# Patient Record
Sex: Male | Born: 1950 | Race: Black or African American | Hispanic: No | Marital: Married | Smoking: Never smoker
Health system: Southern US, Community
[De-identification: ages and names within clinical notes are randomized; demographics above are authoritative.]

## PROBLEM LIST (undated history)

## (undated) DIAGNOSIS — K5790 Diverticulosis of intestine, part unspecified, without perforation or abscess without bleeding: Secondary | ICD-10-CM

## (undated) DIAGNOSIS — E785 Hyperlipidemia, unspecified: Secondary | ICD-10-CM

## (undated) DIAGNOSIS — M1990 Unspecified osteoarthritis, unspecified site: Secondary | ICD-10-CM

## (undated) DIAGNOSIS — K219 Gastro-esophageal reflux disease without esophagitis: Secondary | ICD-10-CM

## (undated) DIAGNOSIS — E119 Type 2 diabetes mellitus without complications: Secondary | ICD-10-CM

## (undated) DIAGNOSIS — I7774 Dissection of vertebral artery: Principal | ICD-10-CM

## (undated) DIAGNOSIS — E559 Vitamin D deficiency, unspecified: Secondary | ICD-10-CM

## (undated) DIAGNOSIS — G629 Polyneuropathy, unspecified: Secondary | ICD-10-CM

## (undated) DIAGNOSIS — K635 Polyp of colon: Secondary | ICD-10-CM

## (undated) DIAGNOSIS — G4730 Sleep apnea, unspecified: Secondary | ICD-10-CM

## (undated) DIAGNOSIS — I10 Essential (primary) hypertension: Secondary | ICD-10-CM

## (undated) DIAGNOSIS — G8929 Other chronic pain: Secondary | ICD-10-CM

## (undated) HISTORY — DX: Diverticulosis of intestine, part unspecified, without perforation or abscess without bleeding: K57.90

## (undated) HISTORY — DX: Vitamin D deficiency, unspecified: E55.9

## (undated) HISTORY — DX: Sleep apnea, unspecified: G47.30

## (undated) HISTORY — DX: Gastro-esophageal reflux disease without esophagitis: K21.9

## (undated) HISTORY — DX: Type 2 diabetes mellitus without complications: E11.9

## (undated) HISTORY — PX: POLYPECTOMY: SHX149

## (undated) HISTORY — DX: Essential (primary) hypertension: I10

## (undated) HISTORY — DX: Unspecified osteoarthritis, unspecified site: M19.90

## (undated) HISTORY — PX: COLONOSCOPY: SHX174

## (undated) HISTORY — PX: MENISCUS REPAIR: SHX5179

## (undated) HISTORY — DX: Polyneuropathy, unspecified: G62.9

## (undated) HISTORY — DX: Dissection of vertebral artery: I77.74

## (undated) HISTORY — DX: Hyperlipidemia, unspecified: E78.5

## (undated) HISTORY — DX: Polyp of colon: K63.5

## (undated) HISTORY — DX: Other chronic pain: G89.29

---

## 2001-04-26 ENCOUNTER — Ambulatory Visit (HOSPITAL_BASED_OUTPATIENT_CLINIC_OR_DEPARTMENT_OTHER): Admission: RE | Admit: 2001-04-26 | Discharge: 2001-04-26 | Payer: Self-pay | Admitting: Orthopedic Surgery

## 2001-12-12 ENCOUNTER — Emergency Department (HOSPITAL_COMMUNITY): Admission: EM | Admit: 2001-12-12 | Discharge: 2001-12-12 | Payer: Self-pay

## 2013-03-18 ENCOUNTER — Ambulatory Visit (INDEPENDENT_AMBULATORY_CARE_PROVIDER_SITE_OTHER): Payer: PRIVATE HEALTH INSURANCE | Admitting: Family Medicine

## 2013-03-18 VITALS — BP 128/79 | HR 86 | Temp 97.9°F | Resp 18 | Ht 70.0 in | Wt 249.9 lb

## 2013-03-18 DIAGNOSIS — R42 Dizziness and giddiness: Secondary | ICD-10-CM

## 2013-03-18 DIAGNOSIS — E119 Type 2 diabetes mellitus without complications: Secondary | ICD-10-CM

## 2013-03-18 DIAGNOSIS — R350 Frequency of micturition: Secondary | ICD-10-CM

## 2013-03-18 DIAGNOSIS — N390 Urinary tract infection, site not specified: Secondary | ICD-10-CM

## 2013-03-18 LAB — COMPREHENSIVE METABOLIC PANEL
ALT: 97 U/L — ABNORMAL HIGH (ref 0–53)
Alkaline Phosphatase: 64 U/L (ref 39–117)
Creat: 0.87 mg/dL (ref 0.50–1.35)
Sodium: 137 mEq/L (ref 135–145)
Total Bilirubin: 0.4 mg/dL (ref 0.3–1.2)
Total Protein: 7.2 g/dL (ref 6.0–8.3)

## 2013-03-18 LAB — POCT URINALYSIS DIPSTICK
Bilirubin, UA: NEGATIVE
Glucose, UA: 500
Ketones, UA: 15
Leukocytes, UA: NEGATIVE
Nitrite, UA: POSITIVE
Protein, UA: 300
Spec Grav, UA: >=1.03
Urobilinogen, UA: 0.2
pH, UA: 5.5

## 2013-03-18 LAB — POCT UA - MICROSCOPIC ONLY
Casts, Ur, LPF, POC: NEGATIVE
Crystals, Ur, HPF, POC: NEGATIVE
Mucus, UA: POSITIVE
Yeast, UA: NEGATIVE

## 2013-03-18 LAB — POCT CBC
Granulocyte percent: 81.7 % — AB (ref 37–80)
HCT, POC: 44.4 % (ref 43.5–53.7)
Hemoglobin: 14.1 g/dL (ref 14.1–18.1)
Lymph, poc: 1.3 (ref 0.6–3.4)
MCH, POC: 31.3 pg — AB (ref 27–31.2)
MCHC: 31.8 g/dL (ref 31.8–35.4)
MCV: 98.5 fL — AB (ref 80–97)
MID (cbc): 0.3 (ref 0–0.9)
MPV: 8 fL (ref 0–99.8)
POC Granulocyte: 6.9 (ref 2–6.9)
POC LYMPH PERCENT: 15.1 %L (ref 10–50)
POC MID %: 3.2 %M (ref 0–12)
Platelet Count, POC: 321 10*3/uL (ref 142–424)
RBC: 4.51 M/uL — AB (ref 4.69–6.13)
RDW, POC: 14.3 %
WBC: 8.4 10*3/uL (ref 4.6–10.2)

## 2013-03-18 LAB — COMPREHENSIVE METABOLIC PANEL WITH GFR
AST: 56 U/L — ABNORMAL HIGH (ref 0–37)
Albumin: 4.6 g/dL (ref 3.5–5.2)
BUN: 11 mg/dL (ref 6–23)
CO2: 27 meq/L (ref 19–32)
Calcium: 10.1 mg/dL (ref 8.4–10.5)
Chloride: 99 meq/L (ref 96–112)
Glucose, Bld: 210 mg/dL — ABNORMAL HIGH (ref 70–99)
Potassium: 4.8 meq/L (ref 3.5–5.3)

## 2013-03-18 LAB — GLUCOSE, POCT (MANUAL RESULT ENTRY): POC Glucose: 224 mg/dl — AB (ref 70–99)

## 2013-03-18 LAB — POCT GLYCOSYLATED HEMOGLOBIN (HGB A1C): Hemoglobin A1C: 9

## 2013-03-18 MED ORDER — CIPROFLOXACIN HCL 250 MG PO TABS: 250.0000 mg | ORAL_TABLET | Freq: Two times a day (BID) | ORAL | Status: DC

## 2013-03-18 NOTE — Patient Instructions (Addendum)
Urinary Tract Infection Urinary tract infections (UTIs) can develop anywhere along your urinary tract. Your urinary tract is your body's drainage system for removing wastes and extra water. Your urinary tract includes two kidneys, two ureters, a bladder, and a urethra. Your kidneys are a pair of bean-shaped organs. Each kidney is about the size of your fist. They are located below your ribs, one on each side of your spine. CAUSES Infections are caused by microbes, which are microscopic organisms, including fungi, viruses, and bacteria. These organisms are so small that they can only be seen through a microscope. Bacteria are the microbes that most commonly cause UTIs. SYMPTOMS  Symptoms of UTIs may vary by age and gender of the patient and by the location of the infection. Symptoms in young women typically include a frequent and intense urge to urinate and a painful, burning feeling in the bladder or urethra during urination. Older women and men are more likely to be tired, shaky, and weak and have muscle aches and abdominal pain. A fever may mean the infection is in your kidneys. Other symptoms of a kidney infection include pain in your back or sides below the ribs, nausea, and vomiting. DIAGNOSIS To diagnose a UTI, your caregiver will ask you about your symptoms. Your caregiver also will ask to provide a urine sample. The urine sample will be tested for bacteria and white blood cells. White blood cells are made by your body to help fight infection. TREATMENT  Typically, UTIs can be treated with medication. Because most UTIs are caused by a bacterial infection, they usually can be treated with the use of antibiotics. The choice of antibiotic and length of treatment depend on your symptoms and the type of bacteria causing your infection. HOME CARE INSTRUCTIONS  If you were prescribed antibiotics, take them exactly as your caregiver instructs you. Finish the medication even if you feel better after you  have only taken some of the medication.  Drink enough water and fluids to keep your urine clear or pale yellow.  Avoid caffeine, tea, and carbonated beverages. They tend to irritate your bladder.  Empty your bladder often. Avoid holding urine for long periods of time.  Empty your bladder before and after sexual intercourse.  After a bowel movement, women should cleanse from front to back. Use each tissue only once. SEEK MEDICAL CARE IF:   You have back pain.  You develop a fever.  Your symptoms do not begin to resolve within 3 days. SEEK IMMEDIATE MEDICAL CARE IF:   You have severe back pain or lower abdominal pain.  You develop chills.  You have nausea or vomiting.  You have continued burning or discomfort with urination. MAKE SURE YOU:   Understand these instructions.  Will watch your condition.  Will get help right away if you are not doing well or get worse. Document Released: 06/18/2005 Document Revised: 03/09/2012 Document Reviewed: 10/17/2011 [facility] Patient Information 2014 [place], [place]. Diabetes and Exercise Regular exercise is important and can help:   Control blood glucose (sugar).  Decrease blood pressure.    Control blood lipids (cholesterol, triglycerides).  Improve overall health. BENEFITS FROM EXERCISE  Improved fitness.  Improved flexibility.  Improved endurance.  Increased bone density.  Weight control.  Increased muscle strength.  Decreased body fat.  Improvement of the body's use of insulin, a hormone.  Increased insulin sensitivity.  Reduction of insulin needs.  Reduced stress and tension.  Helps you feel better. People with diabetes who add exercise to their  lifestyle gain additional benefits, including:  Weight loss.  Reduced appetite.  Improvement of the body's use of blood glucose.  Decreased risk factors for heart disease:  Lowering of cholesterol and triglycerides.  Raising the level of good  cholesterol (high-density lipoproteins, HDL).  Lowering blood sugar.  Decreased blood pressure. TYPE 1 DIABETES AND EXERCISE  Exercise will usually lower your blood glucose.  If blood glucose is greater than 240 mg/dl, check urine ketones. If ketones are present, do not exercise.  Location of the insulin injection sites may need to be adjusted with exercise. Avoid injecting insulin into areas of the body that will be exercised. For example, avoid injecting insulin into:  The arms when playing tennis.  The legs when jogging. For more information, discuss this with your caregiver.  Keep a record of:  Food intake.  Type and amount of exercise.  Expected peak times of insulin action.  Blood glucose levels. Do this before, during, and after exercise. Review your records with your caregiver. This will help you to develop guidelines for adjusting food intake and insulin amounts.  TYPE 2 DIABETES AND EXERCISE  Regular physical activity can help control blood glucose.  Exercise is important because it may:  Increase the body's sensitivity to insulin.  Improve blood glucose control.  Exercise reduces the risk of heart disease. It decreases serum cholesterol and triglycerides. It also lowers blood pressure.  Those who take insulin or oral hypoglycemic agents should watch for signs of hypoglycemia. These signs include dizziness, shaking, sweating, chills, and confusion.  Body water is lost during exercise. It must be replaced. This will help to avoid loss of body fluids (dehydration) or heat stroke. Be sure to talk to your caregiver before starting an exercise program to make sure it is safe for you. Remember, any activity is better than none.  Document Released: 11/29/2003 Document Revised: 12/01/2011 Document Reviewed: 03/15/2009 [facility] Patient Information 2014 [place], [place].

## 2013-03-18 NOTE — Progress Notes (Signed)
[facility]:  Office Visit  Chief Complaint:  Chief Complaint  Patient presents with  . Dizziness    x 10 days   . Nausea  . Ear Fullness    left    HPI: Kirk Oneal is a 62 y.o. male who complains of  This morning had dizziness, felt like the world was spinning when he was getting up and out of his car from a sitting to standing position. HE is diabetic so he checked his sugars, his blood glucose was 186. BP 143/80 when he tiook it at home. Then he went home and laid down and then got better. The last time he got this was 5 years ago. He worked all night at the [employer]. The last time he got better in 1 day. Has had ear fullness last 10 days, it goes and comes for last 10 days. Dizziness with ROM of head. He has been dry heaving. No fevers or chills. + nauseated. He came home this morning and ate some ceraeal and was not able to keep it down.He has been having increase frequency but no dysuria or pelvic pain. He ahs not been compliant with his metformin or diabetes diet/exercise.  He has been popping alkaseltzers lately for heart burn due to excessive fried foods. Deneis CP/SOB, diaphoresis, HAs, vision changes or weakness. HE has gotten progressively better adn the dizziness is only now with quick ROM of head.   Past Medical History  Diagnosis Date  . Diabetes mellitus without complication    History reviewed. No pertinent past surgical history. History   Social History  . Marital Status: Married    Spouse Name: N/A    Number of Children: N/A  . Years of Education: N/A   Social History Main Topics  . Smoking status: Never Smoker   . Smokeless tobacco: None  . Alcohol Use: No  . Drug Use: No  . Sexually Active: None   Other Topics Concern  . None   Social History Narrative  . None   No family history on file. No Known Allergies Prior to Admission medications   Medication Sig Start Date End Date Taking? Authorizing Provider  metFORMIN  (GLUCOPHAGE) 1000 MG tablet Take 1,000 mg by mouth 2 (two) times daily with a meal.   Yes Historical Provider, MD     ROS: The patient denies fevers, chills, night sweats, unintentional weight loss, chest pain, palpitations, wheezing, dyspnea on exertion, nausea, vomiting, abdominal pain, dysuria, hematuria, melena, numbness, weakness, or tingling.   All other systems have been reviewed and were otherwise negative with the exception of those mentioned in the HPI and as above.    PHYSICAL EXAM: Filed Vitals:   03/18/13 1534  BP: 128/79  Pulse: 86  Temp: 97.9 F (36.6 C)  Resp: 18   Filed Vitals:   03/18/13 1534  Height: 5\' 10"  (1.778 m)  Weight: 249 lb 14.4 oz (113.354 kg)   Body mass index is 35.86 kg/(m^2).  General: Alert, no acute distress HEENT:  Normocephalic, atraumatic, oropharynx patent. EOMI, PERRLA, fundoscopic exam is nl Cardiovascular:  Regular rate and rhythm, no rubs murmurs or gallops.  No Carotid bruits, radial pulse intact. No pedal edema.  Respiratory: Clear to auscultation bilaterally.  No wheezes, rales, or rhonchi.  No cyanosis, no use of accessory musculature GI: No organomegaly, abdomen is soft and non-tender, positive bowel sounds.  No masses. Skin: No rashes. Neurologic: Facial musculature symmetric. Gilberto Better + Psychiatric: Patient is  appropriate throughout our interaction. Lymphatic: No cervical lymphadenopathy Musculoskeletal: Gait intact.   LABS: Results for orders placed in visit on 03/18/13  POCT CBC      Result Value Range   WBC 8.4  4.6 - 10.2 K/uL   Lymph, poc 1.3  0.6 - 3.4   POC LYMPH PERCENT 15.1  10 - 50 %L   MID (cbc) 0.3  0 - 0.9   POC MID % 3.2  0 - 12 %M   POC Granulocyte 6.9  2 - 6.9   Granulocyte percent 81.7 (*) 37 - 80 %G   RBC 4.51 (*) 4.69 - 6.13 M/uL   Hemoglobin 14.1  14.1 - 18.1 g/dL   HCT, POC 40.9  81.1 - 53.7 %   MCV 98.5 (*) 80 - 97 fL   MCH, POC 31.3 (*) 27 - 31.2 pg   MCHC 31.8  31.8 - 35.4 g/dL   RDW,  POC 91.4     Platelet Count, POC 321  142 - 424 K/uL   MPV 8.0  0 - 99.8 fL  POCT GLYCOSYLATED HEMOGLOBIN (HGB A1C)      Result Value Range   Hemoglobin A1C 9.0    POCT URINALYSIS DIPSTICK      Result Value Range   Color, UA yellow     Clarity, UA clear     Glucose, UA 500     Bilirubin, UA neg     Ketones, UA 15     Spec Grav, UA >=1.030     Blood, UA mod     pH, UA 5.5     Protein, UA 300     Urobilinogen, UA 0.2     Nitrite, UA pos     Leukocytes, UA Negative    POCT UA - MICROSCOPIC ONLY      Result Value Range   WBC, Ur, HPF, POC 10-12     RBC, urine, microscopic 4-7     Bacteria, U Microscopic 4+     Mucus, UA pos     Epithelial cells, urine per micros 1-2     Crystals, Ur, HPF, POC neg     Casts, Ur, LPF, POC neg     Yeast, UA neg    GLUCOSE, POCT (MANUAL RESULT ENTRY)      Result Value Range   POC Glucose 224 (*) 70 - 99 mg/dl     EKG/XRAY:   Primary read interpreted by Dr. Conley Rolls at [facility].   ASSESSMENT/PLAN: Encounter Diagnoses  Name Primary?  . Dizziness and giddiness Yes  . Diabetes   . Increased frequency of urination   . UTI (urinary tract infection)    Dizziness due to UTI vs BPPV or orthostatics although orthostatic BPw as normal BP and pulse  laying  112/74, 82; 106/68, 90; 126/76, 94--Orthostatics normal Diabetes poorly controlled Urine cx pendind, CMP pending Push fluids Recheck DM in 2 weeks with sugar logs Take Metformin as rx F/u in 2 weeks Rx Cipro 250 mg BID x 3 days until cx returns Gross sideeffects, risk and benefits, and alternatives of medications d/w patient. Patient is aware that all medications have potential sideeffects and we are unable to predict every sideeffect or drug-drug interaction that may occur.       Hamilton Capri PHUONG, DO 03/18/2013 4:56 PM

## 2013-03-21 ENCOUNTER — Telehealth: Payer: Self-pay | Admitting: Family Medicine

## 2013-03-21 LAB — URINE CULTURE: Colony Count: >=100000

## 2013-03-21 NOTE — Telephone Encounter (Signed)
Spoke to patietn about labs

## 2013-03-24 ENCOUNTER — Telehealth: Payer: Self-pay

## 2013-03-24 ENCOUNTER — Other Ambulatory Visit: Payer: Self-pay | Admitting: Family Medicine

## 2013-03-24 NOTE — Telephone Encounter (Signed)
The UCx confirms a UTI and that the cipro she was given is the correct treatment.  3 days is typically adequate treatment.  Have her symptoms resolved?

## 2013-03-24 NOTE — Telephone Encounter (Signed)
Will you review culture and advise? He was given 3d cipro until culture

## 2013-03-24 NOTE — Telephone Encounter (Signed)
Pt states that he still having symptoms from the UTI that he was seen for recently, he would like a refill on cipro just incase the symptoms does not go away while he is on vacation. [facility] [address]  [place] [phone]

## 2013-03-25 NOTE — Telephone Encounter (Signed)
Pt aware of results. He is feeling better.

## 2013-03-30 ENCOUNTER — Ambulatory Visit (INDEPENDENT_AMBULATORY_CARE_PROVIDER_SITE_OTHER): Payer: PRIVATE HEALTH INSURANCE | Admitting: Family Medicine

## 2013-03-30 VITALS — BP 133/76 | HR 89 | Temp 97.8°F | Resp 18 | Ht 69.0 in | Wt 248.0 lb

## 2013-03-30 DIAGNOSIS — R42 Dizziness and giddiness: Secondary | ICD-10-CM

## 2013-03-30 DIAGNOSIS — H8110 Benign paroxysmal vertigo, unspecified ear: Secondary | ICD-10-CM

## 2013-03-30 LAB — POCT URINALYSIS DIPSTICK
Bilirubin, UA: NEGATIVE
Blood, UA: NEGATIVE
Glucose, UA: NEGATIVE
Leukocytes, UA: NEGATIVE
Nitrite, UA: NEGATIVE
Protein, UA: 30
Spec Grav, UA: >=1.03
Urobilinogen, UA: 0.2
pH, UA: 5.5

## 2013-03-30 LAB — POCT UA - MICROSCOPIC ONLY
Bacteria, U Microscopic: NEGATIVE
Casts, Ur, LPF, POC: NEGATIVE
Crystals, Ur, HPF, POC: NEGATIVE
Yeast, UA: NEGATIVE

## 2013-03-30 NOTE — Patient Instructions (Addendum)
Vertigo Vertigo means you feel like you are moving when you are not. Vertigo can make you feel like things around you are moving when they are not. This problem often goes away on its own.  HOME CARE   Follow your doctor's instructions.  Avoid driving.  Avoid using heavy machinery.  Avoid doing any activity that could be dangerous if you have a vertigo attack.  Tell your doctor if a medicine seems to cause your vertigo. GET HELP RIGHT AWAY IF:   Your medicines do not help or make you feel worse.  You have trouble talking or walking.  You feel weak or have trouble using your arms, hands, or legs.  You have bad headaches.  You keep feeling sick to your stomach (nauseous) or throwing up (vomiting).  Your vision changes.  A family member notices changes in your behavior.  Your problems get worse. MAKE SURE YOU:  Understand these instructions.  Will watch your condition.  Will get help right away if you are not doing well or get worse. Document Released: 06/17/2008 Document Revised: 12/01/2011 Document Reviewed: 03/27/2011 [facility] Patient Information 2014 [place], [place]. Dizziness Dizziness is a common problem. It is a feeling of unsteadiness or lightheadedness. You may feel like you are about to faint. Dizziness can lead to injury if you stumble or fall. A person of any age group can suffer from dizziness, but dizziness is more common in older adults. CAUSES  Dizziness can be caused by many different things, including:  Middle ear problems.  Standing for too long.  Infections.  An allergic reaction.  Aging.  An emotional response to something, such as the sight of blood.  Side effects of medicines.  Fatigue.  Problems with circulation or blood pressure.  Excess use of alcohol, medicines, or illegal drug use.  Breathing too fast (hyperventilation).  An arrhythmia or problems with your heart rhythm.  Low red blood cell count  (anemia).  Pregnancy.  Vomiting, diarrhea, fever, or other illnesses that cause dehydration.  Diseases or conditions such as Parkinson's disease, high blood pressure (hypertension), diabetes, and thyroid problems.  Exposure to extreme heat. DIAGNOSIS  To find the cause of your dizziness, your caregiver may do a physical exam, lab tests, radiologic imaging scans, or an electrocardiography test (ECG).  TREATMENT  Treatment of dizziness depends on the cause of your symptoms and can vary greatly. HOME CARE INSTRUCTIONS   Drink enough fluids to keep your urine clear or pale yellow. This is especially important in very hot weather. In the elderly, it is also important in cold weather.  If your dizziness is caused by medicines, take them exactly as directed. When taking blood pressure medicines, it is especially important to get up slowly.  Rise slowly from chairs and steady yourself until you feel okay.  In the morning, first sit up on the side of the bed. When this seems okay, stand slowly while holding onto something until you know your balance is fine.  If you need to stand in one place for a long time, be sure to move your legs often. Tighten and relax the muscles in your legs while standing.  If dizziness continues to be a problem, have someone stay with you for a day or two. Do this until you feel you are well enough to stay alone. Have the person call your caregiver if he or she notices changes in you that are concerning.  Do not drive or use heavy machinery if you feel dizzy.  Do not drink alcohol. SEEK IMMEDIATE MEDICAL CARE IF:   Your dizziness or lightheadedness gets worse.  You feel nauseous or vomit.  You develop problems with talking, walking, weakness, or using your arms, hands, or legs.  You are not thinking clearly or you have difficulty forming sentences. It may take a friend or family member to determine if your thinking is normal.  You develop chest pain,  abdominal pain, shortness of breath, or sweating.  Your vision changes.  You notice any bleeding.  You have side effects from medicine that seems to be getting worse rather than better. MAKE SURE YOU:   Understand these instructions.  Will watch your condition.  Will get help right away if you are not doing well or get worse. Document Released: 03/04/2001 Document Revised: 12/01/2011 Document Reviewed: 03/28/2011 [facility] Patient Information 2014 [place], [place].

## 2013-03-30 NOTE — Progress Notes (Signed)
[facility]:  Office Visit  Chief Complaint:  Chief Complaint  Patient presents with  . Follow-up    UTI  . Dizziness    started today    HPI: DENISE BRAMBLETT is a 62 y.o. male who complains of dizziness He has had this before, he has had UTI sxs, treated with Cipro Has had left ear pressure when he has dizzy spells This morning it happened after he got offf work, he thought his sugars were too high after he ate it was 190s so he took his Amryl 4 mg and started getting dizzy and nasueated.  He denies diaphoresis, CP, palpitations, vomting, abd pain I  Past Medical History  Diagnosis Date  . Diabetes mellitus without complication    History reviewed. No pertinent past surgical history. History   Social History  . Marital Status: Married    Spouse Name: N/A    Number of Children: N/A  . Years of Education: N/A   Social History Main Topics  . Smoking status: Never Smoker   . Smokeless tobacco: None  . Alcohol Use: No  . Drug Use: No  . Sexually Active: None   Other Topics Concern  . None   Social History Narrative  . None   History reviewed. No pertinent family history. No Known Allergies Prior to Admission medications   Medication Sig Start Date End Date Taking? Authorizing Provider  glipiZIDE (GLUCOTROL) 10 MG tablet Take 10 mg by mouth 2 (two) times daily before a meal.   Yes Historical Provider, MD  metFORMIN (GLUCOPHAGE) 1000 MG tablet Take 1,000 mg by mouth 2 (two) times daily with a meal.   Yes Historical Provider, MD  ciprofloxacin (CIPRO) 250 MG tablet Take 1 tablet (250 mg total) by mouth 2 (two) times daily. 03/18/13   Thao P Le, DO     ROS: The patient denies fevers, chills, night sweats, unintentional weight loss, chest pain, palpitations, wheezing, dyspnea on exertion, nausea, vomiting, abdominal pain, dysuria, hematuria, melena, numbness, weakness, or tingling.  All other systems have been reviewed and were otherwise negative  with the exception of those mentioned in the HPI and as above.    PHYSICAL EXAM: Filed Vitals:   03/30/13 1651  BP: 133/76  Pulse: 89  Temp:   Resp:    Filed Vitals:   03/30/13 1610  Height: 5\' 9"  (1.753 m)  Weight: 248 lb (112.492 kg)   Body mass index is 36.61 kg/(m^2).  General: Alert, no acute distress HEENT:  Normocephalic, atraumatic, oropharynx patent. Tm nl, EOMI, PERRLA Cardiovascular:  Regular rate and rhythm, no rubs murmurs or gallops.  No Carotid bruits, radial pulse intact. No pedal edema.  Respiratory: Clear to auscultation bilaterally.  No wheezes, rales, or rhonchi.  No cyanosis, no use of accessory musculature GI: No organomegaly, abdomen is soft and non-tender, positive bowel sounds.  No masses. Skin: No rashes. Neurologic: Facial musculature symmetric. Psychiatric: Patient is appropriate throughout our interaction. Lymphatic: No cervical lymphadenopathy Musculoskeletal: Gait intact.   LABS: Results for orders placed in visit on 03/30/13  POCT URINALYSIS DIPSTICK      Result Value Range   Color, UA yellow     Clarity, UA clear     Glucose, UA neg     Bilirubin, UA neg     Ketones, UA trace     Spec Grav, UA >=1.030     Blood, UA neg     pH, UA 5.5     Protein,  UA 30     Urobilinogen, UA 0.2     Nitrite, UA neg     Leukocytes, UA Negative    POCT UA - MICROSCOPIC ONLY      Result Value Range   WBC, Ur, HPF, POC 1-5     RBC, urine, microscopic 2-7     Bacteria, U Microscopic neg     Mucus, UA moderate     Epithelial cells, urine per micros 0-1     Crystals, Ur, HPF, POC neg     Casts, Ur, LPF, POC neg     Yeast, UA neg       EKG/XRAY:   Primary read interpreted by Dr. Conley Rolls at [facility].   ASSESSMENT/PLAN: Encounter Diagnoses  Name Primary?  . Dizziness and giddiness Yes  . Benign paroxysmal positional vertigo    Orthostatics normal Rx Meclizine Monitor BP and glucose He should really stop taking glimiperide 4 mg prn which may be a  contrinuting factor to his dizziness IF continue after dc Amryl prn completely and taking meclizine then he needs to followupwith US F/u prn , will call him by phone Work note  X 1 day given    LE, THAO PHUONG, DO 03/30/2013 5:49 PM

## 2013-04-12 ENCOUNTER — Telehealth: Payer: Self-pay

## 2013-04-12 NOTE — Telephone Encounter (Signed)
Dr.Le, Pt states that he is still experiencing vertigo symptoms (ex: slight dizziness, and vomiting). Pt also would like an out of work note for tonight if possible. Best# [phone]

## 2013-04-13 NOTE — Telephone Encounter (Signed)
Please advise 

## 2013-04-15 NOTE — Telephone Encounter (Signed)
I am ok with giving him the work note for the dat he needs it. I Would like to see him again, after he has been off of the amryl 4 mg prn and also takeing his meclizine and also takig his blood sugars. IF he can kee a log of his sugars when this is happpening and time of the events it wold be great. PLease follow-up in 1 week. Sooner if he has worening sxs or go to ER as needed,       Left message for him to call me back.

## 2013-04-15 NOTE — Telephone Encounter (Signed)
Patient called back, was advised, work note at front desk for pick up

## 2013-04-30 ENCOUNTER — Emergency Department (HOSPITAL_COMMUNITY): Payer: No Typology Code available for payment source

## 2013-04-30 ENCOUNTER — Encounter (HOSPITAL_COMMUNITY): Payer: Self-pay | Admitting: Emergency Medicine

## 2013-04-30 ENCOUNTER — Emergency Department (HOSPITAL_COMMUNITY)
Admission: EM | Admit: 2013-04-30 | Discharge: 2013-04-30 | Disposition: A | Discharge status: Home / Self Care | Payer: No Typology Code available for payment source | Attending: Emergency Medicine | Admitting: Emergency Medicine

## 2013-04-30 DIAGNOSIS — E119 Type 2 diabetes mellitus without complications: Secondary | ICD-10-CM | POA: Insufficient documentation

## 2013-04-30 DIAGNOSIS — Z79899 Other long term (current) drug therapy: Secondary | ICD-10-CM | POA: Insufficient documentation

## 2013-04-30 DIAGNOSIS — I7774 Dissection of vertebral artery: Secondary | ICD-10-CM | POA: Insufficient documentation

## 2013-04-30 LAB — POCT I-STAT, CHEM 8
Calcium, Ion: 1.18 mmol/L (ref 1.13–1.30)
Creatinine, Ser: 0.9 mg/dL (ref 0.50–1.35)
Hemoglobin: 13.6 g/dL (ref 13.0–17.0)
Sodium: 138 mEq/L (ref 135–145)
TCO2: 23 mmol/L (ref 0–100)

## 2013-04-30 MED ORDER — DIAZEPAM 5 MG PO TABS
5.0000 mg | ORAL_TABLET | Freq: Once | ORAL | Status: AC
  Administered 2013-04-30: 5 mg via ORAL
  Filled 2013-04-30: qty 1

## 2013-04-30 MED ORDER — CLOPIDOGREL BISULFATE 75 MG PO TABS: 75.0000 mg | ORAL_TABLET | Freq: Every day | ORAL | Status: DC

## 2013-04-30 MED ORDER — CLOPIDOGREL BISULFATE 75 MG PO TABS
75.0000 mg | ORAL_TABLET | Freq: Once | ORAL | Status: AC
  Administered 2013-04-30: 75 mg via ORAL
  Filled 2013-04-30: qty 1

## 2013-04-30 MED ORDER — ASPIRIN 81 MG PO CHEW: 81.0000 mg | CHEWABLE_TABLET | Freq: Every day | ORAL | Status: DC

## 2013-04-30 MED ORDER — IOHEXOL 350 MG/ML SOLN
100.0000 mL | Freq: Once | INTRAVENOUS | Status: AC | PRN
  Administered 2013-04-30: 100 mL via INTRAVENOUS

## 2013-04-30 MED ORDER — ONDANSETRON 8 MG PO TBDP
8.0000 mg | ORAL_TABLET | Freq: Once | ORAL | Status: AC
  Administered 2013-04-30: 8 mg via ORAL
  Filled 2013-04-30 (×2): qty 1

## 2013-04-30 MED ORDER — ASPIRIN 325 MG PO TABS
325.0000 mg | ORAL_TABLET | Freq: Once | ORAL | Status: AC
  Administered 2013-04-30: 325 mg via ORAL
  Filled 2013-04-30: qty 1

## 2013-04-30 NOTE — ED Notes (Signed)
Patient transported to MRI by Jorja Loa, RN.

## 2013-04-30 NOTE — ED Provider Notes (Signed)
CSN: [id number]     Arrival date & time 04/30/13  0701 History     First MD Initiated Contact with Patient 04/30/13 [phone]     Chief Complaint  Patient presents with  . Dizziness    The history is provided by the patient.   patient reports ongoing dizziness over the past 4-6 weeks.  He states today's episode is worse that has been for the past.  He states he is having some difficulty walking.  He has no headaches.  He does have some mild right neck pain which been present over the past 2 weeks.  His symptoms had started prior to his neck pain.  He has had some chiropractic work to see if his neck pain will improve with manipulation.  Has significant headaches.  History cancer.  He is a diabetic.  No fevers or chills.  No neck stiffness.  No chest pain or shortness of breath.  He states that rapid movements of his head seemed to make his symptoms worse.  He is on Anti-vertigo homeopathic products that seem to be helping his symptoms.  Today they did help his symptoms and thus he presents the emergency department.  He has had 2 office visits for the same symptoms of the past 4 weeks.  No prior history of stroke.  No recent trauma.  Patient does not smoke cigarettes.  His only medical problems diabetes.  Past Medical History  Diagnosis Date  . Diabetes mellitus without complication    History reviewed. No pertinent past surgical history. No family history on file. History  Substance Use Topics  . Smoking status: Never Smoker   . Smokeless tobacco: Not on file  . Alcohol Use: Yes     Comment: occasional    Review of Systems  All other systems reviewed and are negative.    Allergies  Review of patient's allergies indicates no known allergies.  Home Medications   Current Outpatient Rx  Name  Route  Sig  Dispense  Refill  . aspirin-sod bicarb-citric acid (ALKA-SELTZER) 325 MG TBEF tablet   Oral   Take 325 mg by mouth every 6 (six) hours as needed (for headache).         .  Homeopathic Products (VERTIGOHEEL PO)   Oral   Take 1 tablet by mouth daily. For swimmers head         . metFORMIN (GLUCOPHAGE) 1000 MG tablet   Oral   Take 1,000 mg by mouth 2 (two) times daily with a meal.          BP 120/69  Pulse 79  Temp(Src) 97.9 F (36.6 C) (Oral)  Resp 18  Wt 253 lb (114.76 kg)  BMI 37.34 kg/m2  SpO2 97% Physical Exam  Nursing note and vitals reviewed. Constitutional: He is oriented to person, place, and time. He appears well-developed and well-nourished.  HENT:  Head: Normocephalic and atraumatic.  Eyes: EOM are normal. Pupils are equal, round, and reactive to light.  Neck: Normal range of motion.  Cardiovascular: Normal rate, regular rhythm, normal heart sounds and intact distal pulses.   Pulmonary/Chest: Effort normal and breath sounds normal. No respiratory distress.  Abdominal: Soft. He exhibits no distension. There is no tenderness.  Genitourinary: Rectum normal.  Musculoskeletal: Normal range of motion.  Neurological: He is alert and oriented to person, place, and time.  5/5 strength in major muscle groups of  bilateral upper and lower extremities. Speech normal. No facial asymetry.  Normal finger to nose bilaterally.  Ataxic gait, unable to tandem gait  Skin: Skin is warm and dry.  Psychiatric: He has a normal mood and affect. Judgment normal.    ED Course   Procedures (including critical care time)  Labs Reviewed  POCT I-STAT, CHEM 8 - Abnormal; Notable for the following:    Glucose, Bld 199 (*)    All other components within normal limits   Ct Angio Head W/cm &/or Wo Cm  04/30/2013   *RADIOLOGY REPORT*  Clinical Data:  30-day history of dizziness, vertigo and right neck pain.  Abnormal MR.  CT ANGIOGRAPHY HEAD AND NECK  Technique:  Multidetector CT imaging of the head and neck was performed using the standard protocol during bolus administration of intravenous contrast.  Multiplanar CT image reconstructions including MIPs were  obtained to evaluate the vascular anatomy. Carotid stenosis measurements (when applicable) are obtained utilizing NASCET criteria, using the distal internal carotid diameter as the denominator.  Contrast: OMNIPAQUE IOHEXOL 350 MG/ML SOLN  Comparison:  Brain MR 04/30/2013.  CTA NECK  Findings:  Aortic arch incompletely assessed on the present examination.  Streak artifact from injection slightly limits evaluation of aortic arch and great vessels.  Right vertebral artery is markedly attenuated in caliber, smaller than the size of the transverse foramen.  With the clinical history, this is suspicious for dissection with atherosclerotic type changes a secondary consideration.  The right vertebral artery does not appear completely occluded.  Slightly limited evaluation of proximal aspect of the left vertebral artery caused by streak artifact and patient's shoulders. Beyond this region, left vertebral artery is patent without significant narrowing or irregularity.  Evaluation of the common carotid artery limited by streak artifact. Portions visualized unremarkable.  No evidence of hemodynamically significant stenosis involving either carotid bifurcation.  Dental artifact extends through the proximal internal carotid arteries more notable on the left slightly limiting evaluation at this level.  No primary worrisome neck mass.  No adenopathy.  Lung apices which are visualized are clear.  Cervical spondylotic changes incompletely assessed secondary patient's habitus.  No bony destructive lesion.   Review of the MIP images confirms the above findings.  IMPRESSION: Right vertebral artery is markedly attenuated in caliber, smaller than the size of the transverse foramen.  With the clinical history, this is suspicious for dissection with atherosclerotic type changes a secondary consideration.  The right vertebral artery does not appear completely occluded.  Please see above.  CTA HEAD  Findings:  No intracranial  hemorrhage.  No CT evidence of large acute infarct.  No intracranial enhancing lesion.  Cavernous segment internal carotid artery mild calcified plaque with mild narrowing.  Anterior circulation without medium or large size vessel significant stenosis or occlusion.  The left vertebral artery is dominant.  Diminutive irregular right vertebral artery suggestive of result of dissection with atherosclerotic type changes a secondary less likely consideration.  Mild narrowing and irregularity of the basilar artery.  No aneurysm or vascular malformation noted.  Mild venous contamination slightly limits evaluation for detection of small aneurysm.   Review of the MIP images confirms the above findings.  IMPRESSION: Diminutive irregular right vertebral artery suggestive of result of dissection with atherosclerotic type changes a secondary less likely consideration.  Mild narrowing and irregularity of the basilar artery.  Cavernous segment internal carotid artery mild calcified plaque with mild narrowing.   Original Report Authenticated By: Lacy Duverney, M.D.   Ct Angio Neck W/cm &/or Wo/cm  04/30/2013   *RADIOLOGY REPORT*  Clinical Data:  30-day history  of dizziness, vertigo and right neck pain.  Abnormal MR.  CT ANGIOGRAPHY HEAD AND NECK  Technique:  Multidetector CT imaging of the head and neck was performed using the standard protocol during bolus administration of intravenous contrast.  Multiplanar CT image reconstructions including MIPs were obtained to evaluate the vascular anatomy. Carotid stenosis measurements (when applicable) are obtained utilizing NASCET criteria, using the distal internal carotid diameter as the denominator.  Contrast: OMNIPAQUE IOHEXOL 350 MG/ML SOLN  Comparison:  Brain MR 04/30/2013.  CTA NECK  Findings:  Aortic arch incompletely assessed on the present examination.  Streak artifact from injection slightly limits evaluation of aortic arch and great vessels.  Right vertebral artery is  markedly attenuated in caliber, smaller than the size of the transverse foramen.  With the clinical history, this is suspicious for dissection with atherosclerotic type changes a secondary consideration.  The right vertebral artery does not appear completely occluded.  Slightly limited evaluation of proximal aspect of the left vertebral artery caused by streak artifact and patient's shoulders. Beyond this region, left vertebral artery is patent without significant narrowing or irregularity.  Evaluation of the common carotid artery limited by streak artifact. Portions visualized unremarkable.  No evidence of hemodynamically significant stenosis involving either carotid bifurcation.  Dental artifact extends through the proximal internal carotid arteries more notable on the left slightly limiting evaluation at this level.  No primary worrisome neck mass.  No adenopathy.  Lung apices which are visualized are clear.  Cervical spondylotic changes incompletely assessed secondary patient's habitus.  No bony destructive lesion.   Review of the MIP images confirms the above findings.  IMPRESSION: Right vertebral artery is markedly attenuated in caliber, smaller than the size of the transverse foramen.  With the clinical history, this is suspicious for dissection with atherosclerotic type changes a secondary consideration.  The right vertebral artery does not appear completely occluded.  Please see above.  CTA HEAD  Findings:  No intracranial hemorrhage.  No CT evidence of large acute infarct.  No intracranial enhancing lesion.  Cavernous segment internal carotid artery mild calcified plaque with mild narrowing.  Anterior circulation without medium or large size vessel significant stenosis or occlusion.  The left vertebral artery is dominant.  Diminutive irregular right vertebral artery suggestive of result of dissection with atherosclerotic type changes a secondary less likely consideration.  Mild narrowing and irregularity  of the basilar artery.  No aneurysm or vascular malformation noted.  Mild venous contamination slightly limits evaluation for detection of small aneurysm.   Review of the MIP images confirms the above findings.  IMPRESSION: Diminutive irregular right vertebral artery suggestive of result of dissection with atherosclerotic type changes a secondary less likely consideration.  Mild narrowing and irregularity of the basilar artery.  Cavernous segment internal carotid artery mild calcified plaque with mild narrowing.   Original Report Authenticated By: Lacy Duverney, M.D.   Mr Brain Wo Contrast  04/30/2013   *RADIOLOGY REPORT*  Clinical Data: Dizziness, vertigo, ataxia and gait instability over past 30 days and worsening.  Diabetes.  MRI HEAD WITHOUT CONTRAST  Technique:  Multiplanar, multiecho pulse sequences of the brain and surrounding structures were obtained according to standard protocol without intravenous contrast.  Comparison: None.  Findings: Abnormal appearance of the right vertebral artery which appears occluded.  Question if this is related to a dissection or atherosclerotic changes.  CT angiogram or MR angiogram can be obtained for further delineation.  No acute infarct.  No intracranial hemorrhage.  No hydrocephalus.  No intracranial mass lesion detected on this unenhanced exam.  Cervical medullary junction, pituitary region, pineal region and orbital structures unremarkable.  Minimal paranasal sinus mucosal thickening.  IMPRESSION:  Abnormal appearance of the right vertebral artery which appears occluded.  Question if this is related to a dissection or atherosclerotic changes.  CT angiogram or MR angiogram can be obtained for further delineation.  No acute infarct.  Critical Value/emergent results were called by telephone at the time of interpretation on 04/30/2013 at 9:20 a.m. to Dr. Patria Mane, who verbally acknowledged these results.   Original Report Authenticated By: Lacy Duverney, M.D.   I personally  reviewed the imaging tests through PACS system I reviewed available ER/hospitalization records through the EMR   1. Vertebral artery dissection     MDM  Likely peripheral vertigo.  Given the patient's ataxia MRI of his brain will be obtained to evaluate for central vertigo.  Oral Valium and Zofran now.  9:32 AM Abnormal right vertebral artery on MRI.  Patient will undergo CT angina neck to evaluate for vertebral dissection.  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  I discussed the case with neurology recommends close outpatient followup.  Neurology is recommending dual platelet treatment with aspirin Plavix.  Patient understands return to the ER for new or worsening symptoms.  Patient is stable for discharge home at this time.  Lyanne Co, MD 04/30/13 [phone]

## 2013-04-30 NOTE — ED Notes (Signed)
Saline lock placed in left hand, 20 gauge angiocath.  Flushed, patient tolerated procedure well.

## 2013-04-30 NOTE — ED Notes (Signed)
Patient resting quietly on stretcher.

## 2013-04-30 NOTE — ED Notes (Addendum)
Pt c/o dizziness worse with moving head x 30 days. Pt states he has seen PCP and chiropractor with no relief. Denies LOC, + nausea, denies v/d with this episode. A & O, NAD

## 2013-04-30 NOTE — ED Notes (Signed)
Pt c/o dizziness times 30 days.  Seen and prescribed :verticlear" and treated for a urinary tract infection.  Also c/o muscular pain in 2 days.  Patient instructed to use call bell and yellow bracelet attached to wrist.  In no acute distress.

## 2013-05-13 ENCOUNTER — Telehealth: Payer: Self-pay | Admitting: Neurology

## 2013-05-17 ENCOUNTER — Encounter: Payer: Self-pay | Admitting: Neurology

## 2013-05-17 ENCOUNTER — Ambulatory Visit (INDEPENDENT_AMBULATORY_CARE_PROVIDER_SITE_OTHER): Payer: PRIVATE HEALTH INSURANCE | Admitting: Neurology

## 2013-05-17 VITALS — BP 131/82 | HR 85 | Temp 97.2°F | Ht 68.5 in | Wt 251.0 lb

## 2013-05-17 DIAGNOSIS — I7774 Dissection of vertebral artery: Secondary | ICD-10-CM

## 2013-05-17 DIAGNOSIS — E119 Type 2 diabetes mellitus without complications: Secondary | ICD-10-CM

## 2013-05-17 DIAGNOSIS — R0683 Snoring: Secondary | ICD-10-CM

## 2013-05-17 DIAGNOSIS — R0609 Other forms of dyspnea: Secondary | ICD-10-CM

## 2013-05-17 DIAGNOSIS — E785 Hyperlipidemia, unspecified: Secondary | ICD-10-CM

## 2013-05-17 DIAGNOSIS — R42 Dizziness and giddiness: Secondary | ICD-10-CM

## 2013-05-17 HISTORY — DX: Dissection of vertebral artery: I77.74

## 2013-05-17 NOTE — Patient Instructions (Signed)
Please ask family and friends or your significant other or bed partner if you snore and if so, how loud it is, and if you have breathing related issues in your sleep, such as: snorting sounds, choking sounds, pauses in your breathing or shallow breathing events. These may be symptoms of obstructive sleep apnea (OSA).   Continue your medications as directed, which includes dual antiplatelet therapy: Aspirin and Plavix until further. As discussed, secondary prevention is key after a stroke or TIA or when there is hardening of the arteries. This means: taking care of blood sugar values or diabetes management, good blood pressure (hypertension) control and optimizing cholesterol management, exercising daily or regularly within your own mobility limitations of course and overall cardiovascular risk factor reduction, which includes screening for and treatment of obstructive sleep apnea (OSA).   Please make an appointment with your primary care physician for BP, Diabetes and cholesterol management.   I will request a second opinion from one of our vascular specialists, either Dr. Marjory Lies or Dr. Pearlean Brownie.   We may go ahead and do a sleep study down the road as you are at risk for sleep apnea, due to being overweight and having a larger neck size and having a tighter airway.

## 2013-05-17 NOTE — Progress Notes (Signed)
Subjective:    Patient ID: Kirk Oneal is a 62 y.o. male.  HPI Kirk Foley, MD, PhD [facility] [address], [address] [address] [place], [place] [address]  Dear Dr. Patria Mane,   I saw patient Kirk Oneal, upon your kind request in my neurologic clinic today for initial consultation of his dizziness. The patient is unaccompanied today. As you know, Kirk Oneal is a very pleasant 62 year old right-handed gentleman with an underlying medical history of diabetes, who presented to the emergency room on 04/30/2013 with a fourth 6 week history of dizziness. Before that he went to [facility] in July and was told he had a UTI. He went back and was given an Rx for meclizine, which did help in the beginning, but as it did not help on 04/29/13, which was a thursday, he went to the ER and wanted to get checked out completely as he had recurrent Sx without definitive answer. He had been taking a baby aspirin for about 8 months per PCP.  He has had prior episodes of dizziness about 3 times in the last 6 months and one time some 4 years ago, lasting for hours at a time. He reported no recurrent headaches, but does report a slight neck since July. He went to the chiropractor at the end of July and has had some chiropractic treatment including neck manipulation one time, however, his symptoms were present before and did not get worse after. 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 His vertiginous symptoms are made worse with head movements. He has no current vertigo, but does not feel completely right either he states. He had a CT angiogram head and neck on 04/30/2013 which showed right vertebral artery to be markedly attenuated in caliber, smaller than the size of the transverse foramen. There was suspicion for dissection. CTA head showed no significant abnormalities. MRI brain without contrast  showed abnormal appearance of the right vertebral artery which appears occluded and no acute infarct was seen. Neurology was consulted while the patient was in the emergency room and recommended close followup outpatient and addition of Plavix. Of note, he snores sometimes, mostly mild, and denies apneic pauses or choking sensations. However, he does not feel rested. 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   His Past Medical History Is Significant For: Past Medical History  Diagnosis Date  . Diabetes mellitus without complication    His Past Surgical History Is Significant For: History reviewed. No pertinent past surgical history.  His Family History Is Significant For: History reviewed. No pertinent family history.  His Social History Is Significant For: History   Social History  . Marital Status: Married    Spouse Name: N/A    Number of Children: N/A  . Years of Education: N/A   Social History Main Topics  . Smoking status: Never Smoker   . Smokeless tobacco: None  . Alcohol Use: Yes     Comment: occasional  . Drug Use: No  . Sexual Activity: None   Other Topics Concern  . None   Social History Narrative  . None    His Allergies Are:  No Known Allergies:   His Current Medications Are:  Outpatient Encounter Prescriptions as of 05/17/2013  Medication Sig Dispense Refill  . aspirin 81 MG chewable tablet Chew 1 tablet (81 mg total) by mouth  daily.  30 tablet  0  . aspirin-sod bicarb-citric acid (ALKA-SELTZER) 325 MG TBEF tablet Take 325 mg by mouth every 6 (six) hours as needed (for headache).      . clopidogrel (PLAVIX) 75 MG tablet Take 1 tablet (75 mg total) by mouth daily.  30 tablet  0  . glimepiride (AMARYL) 4 MG tablet Take 1 tablet by mouth daily.      . Homeopathic Products (VERTIGOHEEL PO) Take 1 tablet by mouth daily. For swimmers head      . metFORMIN (GLUCOPHAGE) 1000 MG tablet Take 1,000 mg by mouth 2 (two) times  daily with a meal.      . ZETIA 10 MG tablet Take 1 tablet by mouth daily.       No facility-administered encounter medications on file as of 05/17/2013.   Review of Systems  HENT: Positive for tinnitus.   Neurological: Positive for dizziness.  Psychiatric/Behavioral:       Sleepiness    Objective:  Neurologic Exam  Physical Exam Physical Examination:   Filed Vitals:   05/17/13 0937  BP: 131/82  Pulse: 85  Temp: 97.2 F (36.2 C)    General Examination: The patient is a very pleasant 62 y.o. male in no acute distress. He appears well-developed and well-nourished and well groomed. He is overweight.  HEENT: Normocephalic, atraumatic, pupils are equal, round and reactive to light and accommodation. Funduscopic exam is normal with sharp disc margins noted. Extraocular tracking is good without limitation to gaze excursion or nystagmus noted. Normal smooth pursuit is noted. Hearing is grossly intact. Tympanic membranes are clear bilaterally. Face is symmetric with normal facial animation and normal facial sensation. Speech is clear with no dysarthria noted. There is no hypophonia. There is no lip, neck/head, jaw or voice tremor. Neck is supple with full range of passive and active motion. There are no carotid bruits on auscultation. Oropharynx exam reveals: mild mouth dryness, adequate dental hygiene and marked airway crowding, due to redundant soft palate and large her uvula, tonsillar size are 1+, large tongue. Mallampati is class III. Tongue protrudes centrally and palate elevates symmetrically. Neck size is 18.5 inches.   Chest: Clear to auscultation without wheezing, rhonchi or crackles noted.  Heart: S1+S2+0, regular and normal without murmurs, rubs or gallops noted.   Abdomen: Soft, non-tender and non-distended with normal bowel sounds appreciated on auscultation.  Extremities: There is no pitting edema in the distal lower extremities bilaterally. Pedal pulses are intact.  Skin:  Warm and dry without trophic changes noted. There are no varicose veins.  Musculoskeletal: exam reveals no obvious joint deformities, tenderness or joint swelling or erythema.   Neurologically:  Mental status: The patient is awake, alert and oriented in all 4 spheres. His memory, attention, language and knowledge are appropriate. There is no aphasia, agnosia, apraxia or anomia. Speech is clear with normal prosody and enunciation. Thought process is linear. Mood is congruent and affect is normal.  Cranial nerves are as described above under HEENT exam. In addition, shoulder shrug is normal with equal shoulder height noted. Motor exam: Normal bulk, strength and tone is noted. There is no drift, tremor or rebound. Romberg is negative, he has no vertigo. Reflexes are 2+ throughout. Toes are downgoing bilaterally. Fine motor skills are intact with normal finger taps, normal hand movements, normal rapid alternating patting, normal foot taps and normal foot agility.  Cerebellar testing shows no dysmetria or intention tremor on finger to nose testing. Heel to shin is unremarkable bilaterally.  There is no truncal or gait ataxia.  Sensory exam is intact to light touch, pinprick, vibration, temperature sense and proprioception in the upper and lower extremities.  Gait, station and balance are unremarkable. No veering to one side is noted. No leaning to one side is noted. Posture is age-appropriate and stance is narrow based. No problems turning are noted. He turns en bloc. Tandem walk is unremarkable. Intact toe stance is noted.               Assessment and Plan:   In summary, Kirk Oneal is a very pleasant 62 y.o.-year old male with a history of diabetes, and obesity and hyperlipidemia who has a history of recurrent vertigo and a recent diagnosis of a right vertebral dissection. He is doing fairly well at this time and most of our visit concentrated around this factor discussion and secondary cardiovascular  disease prevention. I would like for him to continue with dual antiplatelet therapy at this time which is aspirin plus Plavix which he has been tolerating well. We talked about secondary prevention regarding stroke risk factors which includes diabetes management, blood pressure management, cholesterol management. He was on cholesterol medication before and will most likely have to go back on it. I've asked him to make a followup appointment with his primary care physician to discuss BP management, diabetes management and cholesterol management. He recalls that his most recent hemoglobin A1c was around 9 which is clearly suboptimal. We also talked about weight reduction and screening pressure sleep apnea. While he does not endorse any apneic spells or cyst choking sensations I do believe he is at significant risk for obstructive sleep apnea given his obesity, his larger neck size and his tighter airway. I have asked him to talk to his wife about his snoring and whether she had witnessed any apneas. We may go ahead and proceed with a sleep study down the Road. Furthermore, I would like for him to see one of our vascular specialists in our group for second opinion to make sure this is the best management for him. I would like to see him back in 3 months from now, sooner if the need arises. He is encouraged to call us with any interim questions, concerns, problems or updates and refill requests for Plavix.   Thank you very much for allowing me to participate in the care of this nice patient. If I can be of any further assistance to you please do not hesitate to call me at [phone].  Sincerely,   Kirk Foley, MD, PhD

## 2013-05-25 ENCOUNTER — Other Ambulatory Visit: Payer: Self-pay | Admitting: Neurology

## 2013-06-03 ENCOUNTER — Encounter: Payer: Self-pay | Admitting: Diagnostic Neuroimaging

## 2013-06-03 ENCOUNTER — Ambulatory Visit (INDEPENDENT_AMBULATORY_CARE_PROVIDER_SITE_OTHER): Payer: PRIVATE HEALTH INSURANCE | Admitting: Diagnostic Neuroimaging

## 2013-06-03 VITALS — BP 111/74 | HR 77 | Temp 98.1°F | Ht 69.25 in | Wt 253.0 lb

## 2013-06-03 DIAGNOSIS — G459 Transient cerebral ischemic attack, unspecified: Secondary | ICD-10-CM

## 2013-06-03 DIAGNOSIS — I7774 Dissection of vertebral artery: Secondary | ICD-10-CM

## 2013-06-03 DIAGNOSIS — R42 Dizziness and giddiness: Secondary | ICD-10-CM

## 2013-06-03 NOTE — Patient Instructions (Signed)
Vertebral Artery Dissection The vertebral arteries are major arteries at the base of the neck. They carry blood from the heart to the brain. Vertebral artery dissection is a condition in which a tear develops in the artery wall, allowing blood to collect within the layers of the wall. The blood that collects between the layers may form a clot. This can lead to a stroke if not diagnosed and treated right away. Vertebral artery dissection occurs most often in middle-aged people. It is a common cause of stroke in people younger than 45 years. CAUSES   Injury to the head or neck. The injury may range from mild to severe.  Weak blood vessel walls. The walls may tear even when no outside injury occurs. This is called spontaneous dissection. SYMPTOMS  Symptoms usually appear within days of an injury, but sometimes they do not appear for weeks or years. Symptoms may include:  Pain in the head, neck, eye, or face. The pain is typically stabbing, sharp, and unusual.  Difficulty speaking.  Hoarseness.  Loss of feeling in the torso, legs, or arms.  Loss of sense of taste.  Hiccups.  Vertigo.  Dizziness.  Double vision.  Nausea and vomiting.  Difficulty swallowing.  Loss of balance.  Hearing loss.  Ear pain. DIAGNOSIS  A physical exam will be performed. To diagnose this condition, your caregiver may order various tests, such as:  Computed tomography (CT).  Magnetic resonance imaging (MRI).  Magnetic resonance angiography.  Ultrasonography.  Blood tests. TREATMENT  Immediate treatment is often required to reduce the risk of stroke. Vertebral artery dissection is usually treated with medicines that prevent blood from clotting (anticoagulant and antiplatelet medicines). In some cases, surgery is done to repair the tear in the blood vessel. HOME CARE INSTRUCTIONS  Only take over-the-counter or prescription medicines as directed by your caregiver. Take all medicines exactly as  instructed.  Maintain a healthy weight.  Get regular exercise. Check with your caregiver before starting any new type of exercise.  Do not smoke.  Follow up with your caregiver as directed. SEEK MEDICAL CARE IF:  Your symptoms are not getting better. SEEK IMMEDIATE MEDICAL CARE IF:  You feel weak or dizzy.  You notice changes in your vision or speech.  You have trouble breathing.  You have a sudden, severe headache with no known cause.  You have a loss of balance or coordination.  You have numbness in your face, arm, or leg. Any of these symptoms may represent a serious problem that is an emergency. Do not wait to see if the symptoms will go away. Get medical help right away. Call your local emergency services (911 in [place] Do not drive yourself to the hospital. MAKE SURE YOU:  Understand these instructions.  Will watch your condition.  Will get help right away if you are not doing well or get worse. Document Released: 08/25/2012 Document Reviewed: 07/20/2012 [facility] Patient Information 2014 [place], [place].

## 2013-06-03 NOTE — Progress Notes (Signed)
[facility]  PATIENT: Kirk Oneal DOB: 25-Apr-1951  REFERRING CLINICIAN: Frances Furbish (second opinion) HISTORY FROM: patient  REASON FOR VISIT: new consult   HISTORICAL  CHIEF COMPLAINT:  Chief Complaint  Patient presents with  . NP  consultation    per Dr. Frances Furbish    HISTORY OF PRESENT ILLNESS:   62 year old right-handed male with diabetes, here for valuation of vertebral artery dissection.  4 years ago patient had episode of vertigo with nausea and vomiting. In the past 6 months he has had 3 more episodes. Initially he felt that his left ear was "stopped up". He then had severe spinning, nausea and vomiting sensation. Patient went to medical Dr. for evaluation initially. He then went to the hospital on 04/30/13 for another event. Patient had MRI of the brain which showed no acute stroke but raise the possibility of right vertebral artery occlusion. This is followed up with CT exam of the head and neck which showed thin caliber right vertebral artery, raising possibility for vertebral artery dissection versus atherosclerosis. Patient was on aspirin before this was discovered. He was then discharged on aspirin and Plavix. He saw my colleague Dr. Frances Furbish on 05/17/13, who recommended continuing dual antiplatelet therapy. Patient is now referred to me for second opinion.  Since last office visit, patient has had one more event of spinning and nausea and vomiting. No double vision, slurred speech, trouble swallowing, syncope. He has some intermittent neck pain in the right posterior region. He continues to have some problems with anxiety and balance difficulty.  REVIEW OF SYSTEMS: Full 14 system review of systems performed and notable only for dizziness ringing in ears diabetes.  ALLERGIES: No Known Allergies  HOME MEDICATIONS: Prior to Admission medications   Medication Sig Start Date End Date Taking? Authorizing Provider  clopidogrel (PLAVIX) 75 MG tablet TAKE 1 TABLET BY  MOUTH EVERY DAY 05/25/13  Yes [name], MD  [facility] ASPIRIN CHILD 81 MG chewable tablet TAKE 1 TABLET BY MOUTH EVERY DAY 05/25/13  Yes [name], MD  metFORMIN (GLUCOPHAGE) 1000 MG tablet Take 1,000 mg by mouth 2 (two) times daily with a meal.   Yes Historical Provider, MD  glimepiride (AMARYL) 4 MG tablet Take 1 tablet by mouth daily. 03/12/13   Historical Provider, MD  Homeopathic Products (VERTIGOHEEL PO) Take 1 tablet by mouth daily. For swimmers head    Historical Provider, MD  ZETIA 10 MG tablet Take 1 tablet by mouth daily. 03/18/13   Historical Provider, MD   Outpatient Prescriptions Prior to Visit  Medication Sig Dispense Refill  . clopidogrel (PLAVIX) 75 MG tablet TAKE 1 TABLET BY MOUTH EVERY DAY  90 tablet  0  . [facility] ASPIRIN CHILD 81 MG chewable tablet TAKE 1 TABLET BY MOUTH EVERY DAY  90 tablet  0  . metFORMIN (GLUCOPHAGE) 1000 MG tablet Take 1,000 mg by mouth 2 (two) times daily with a meal.      . aspirin-sod bicarb-citric acid (ALKA-SELTZER) 325 MG TBEF tablet Take 325 mg by mouth every 6 (six) hours as needed (for headache).      Marland Kitchen glimepiride (AMARYL) 4 MG tablet Take 1 tablet by mouth daily.      . Homeopathic Products (VERTIGOHEEL PO) Take 1 tablet by mouth daily. For swimmers head      . ZETIA 10 MG tablet Take 1 tablet by mouth daily.       No facility-administered medications prior to visit.    PAST MEDICAL HISTORY: Past Medical History  Diagnosis Date  . Diabetes mellitus without complication   . 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 No pertinent past surgical history.  FAMILY HISTORY: Family History  Problem Relation Age of Onset  . Stomach cancer Mother   . Rectal cancer Father     SOCIAL HISTORY:  History   Social History  . Marital Status: Married    Spouse Name: sharon    Number of Children: 2  . Years of Education: college   Occupational History  .  [employer]   Social History Main Topics  .  Smoking status: Never Smoker   . Smokeless tobacco: Not on file  . Alcohol Use: Yes     Comment: occasional  . Drug Use: No  . Sexual Activity: Not on file   Other Topics Concern  . Not on file   Social History Narrative  . No narrative on file     PHYSICAL EXAM  Filed Vitals:   06/03/13 0900  BP: 111/74  Pulse: 77  Temp: 98.1 F (36.7 C)  TempSrc: Oral  Height: 5' 9.25" (1.759 m)  Weight: 253 lb (114.76 kg)    Not recorded    Body mass index is 37.09 kg/(m^2).  GENERAL EXAM: Patient is in no distress; DIX HALL PIKE NEG.  CARDIOVASCULAR: Regular rate and rhythm, no murmurs, no carotid or vertebral or supraclavicular bruits; symm radial pulses.  NEUROLOGIC: MENTAL STATUS: awake, alert, language fluent, comprehension intact, naming intact CRANIAL NERVE: no papilledema on fundoscopic exam, pupils equal and reactive to light, visual fields full to confrontation, extraocular muscles intact, no nystagmus, facial sensation and strength symmetric, uvula midline, shoulder shrug symmetric, tongue midline. MOTOR: normal bulk and tone, full strength in the BUE, BLE SENSORY: normal and symmetric to light touch, pinprick, temperature, vibration COORDINATION: finger-nose-finger, fine finger movements normal REFLEXES: deep tendon reflexes present and symmetric GAIT/STATION: narrow based gait; able to walk on toes, heels and tandem; romberg is negative   DIAGNOSTIC DATA (LABS, IMAGING, TESTING) - I reviewed patient records, labs, notes, testing and imaging myself where available.  Lab Results  Component Value Date   WBC 8.4 03/18/2013   HGB 13.6 04/30/2013   HCT 40.0 04/30/2013   MCV 98.5* 03/18/2013      Component Value Date/Time   NA 138 04/30/2013 0955   K 4.2 04/30/2013 0955   CL 103 04/30/2013 0955   CO2 27 03/18/2013 1614   GLUCOSE 199* 04/30/2013 0955   BUN 11 04/30/2013 0955   CREATININE 0.90 04/30/2013 0955   CREATININE 0.87 03/18/2013 1614   CALCIUM 10.1 03/18/2013 1614    PROT 7.2 03/18/2013 1614   ALBUMIN 4.6 03/18/2013 1614   AST 56* 03/18/2013 1614   ALT 97* 03/18/2013 1614   ALKPHOS 64 03/18/2013 1614   BILITOT 0.4 03/18/2013 1614   No results found for this basename: CHOL, HDL, LDLCALC, LDLDIRECT, TRIG, CHOLHDL   Lab Results  Component Value Date   HGBA1C 9.0 03/18/2013   No results found for this basename: VITAMINB12   No results found for this basename: TSH   I reviewed imaging and agree with reports:  04/30/13 CTA head - Diminutive irregular right vertebral artery suggestive of result of  dissection with atherosclerotic type changes a secondary less  likely consideration. Mild narrowing and irregularity of the basilar artery.  Cavernous segment internal carotid artery mild calcified plaque  with mild narrowing.  04/30/13 CTA neck - Right vertebral artery is markedly attenuated  in caliber, smaller  than the size of the transverse foramen. With the clinical  history, this is suspicious for dissection with atherosclerotic  type changes a secondary consideration. The right vertebral artery  does not appear completely occluded.  04/30/13 MRI brain - Abnormal appearance of the right vertebral artery which appears  occluded. Question if this is related to a dissection or  atherosclerotic changes. CT angiogram or MR angiogram can be  obtained for further delineation.  No acute infarct.   ASSESSMENT AND PLAN  62 y.o. year old male here with intermittent episodes of vertigo, nausea, vomiting, balance difficulty since 2010. 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 Is not clear whether or not this is symptomatic. Patient has good contralateral vertebral artery flow and flow through the basilar artery. Patient currently on dual antiplatelet.  PLAN: 1. I recommend he continue aspirin and Plavix for next 3 months and repeat CT imaging of the head and neck. At that time we may the de-escalate 2 single antiplatelet at that time. 2. Complete  TIA workup with cardiac monitor, 2-D echocardiogram, lipid panel. 3. Agree with risk factor mgmt (diabetes, cholesterol) per PCP.   Orders Placed This Encounter  Procedures  . CT Angio Head W/Cm &/Or Wo Cm  . CT Angio Neck W/Cm &/Or Wo/Cm  . Lipid Panel  . Vitamin B12  . TSH  . Hemoglobin A1c  . Ambulatory referral to Cardiology  . 2D Echocardiogram with contrast    Return in about 4 months (around 10/03/2013) for with Heide Guile or Jayd Cadieux.    Suanne Marker, MD 06/03/2013, 9:58 AM Certified in Neurology, Neurophysiology and Neuroimaging  [facility] [address] [place], [place] [address] [phone]

## 2013-06-06 LAB — LIPID PANEL
Chol/HDL Ratio: 4 ratio units (ref 0.0–5.0)
Cholesterol, Total: 152 mg/dL (ref 100–199)
Triglycerides: 117 mg/dL (ref 0–149)

## 2013-06-06 LAB — HEMOGLOBIN A1C
Est. average glucose Bld gHb Est-mCnc: 206 mg/dL
Hgb A1c MFr Bld: 8.8 % — ABNORMAL HIGH (ref 4.8–5.6)

## 2013-06-06 LAB — TSH: TSH: 2.94 u[IU]/mL (ref 0.450–4.500)

## 2013-06-13 ENCOUNTER — Other Ambulatory Visit: Payer: Self-pay | Admitting: *Deleted

## 2013-06-13 DIAGNOSIS — I4891 Unspecified atrial fibrillation: Secondary | ICD-10-CM

## 2013-06-13 DIAGNOSIS — G459 Transient cerebral ischemic attack, unspecified: Secondary | ICD-10-CM

## 2013-06-16 NOTE — Addendum Note (Signed)
Addended byHermenia Fiscal on: 06/16/2013 01:06 PM   Modules accepted: Orders

## 2013-06-27 ENCOUNTER — Telehealth: Payer: Self-pay | Admitting: Diagnostic Neuroimaging

## 2013-06-28 NOTE — Telephone Encounter (Signed)
Patient called requesting lab results

## 2013-06-28 NOTE — Telephone Encounter (Signed)
Called patient gave lab results. 

## 2013-06-30 ENCOUNTER — Encounter (INDEPENDENT_AMBULATORY_CARE_PROVIDER_SITE_OTHER): Payer: No Typology Code available for payment source

## 2013-06-30 ENCOUNTER — Encounter: Payer: Self-pay | Admitting: *Deleted

## 2013-06-30 ENCOUNTER — Other Ambulatory Visit (HOSPITAL_COMMUNITY): Payer: Self-pay | Admitting: Diagnostic Neuroimaging

## 2013-06-30 ENCOUNTER — Ambulatory Visit (HOSPITAL_COMMUNITY): Payer: No Typology Code available for payment source | Attending: Cardiovascular Disease | Admitting: Radiology

## 2013-06-30 DIAGNOSIS — E119 Type 2 diabetes mellitus without complications: Secondary | ICD-10-CM | POA: Insufficient documentation

## 2013-06-30 DIAGNOSIS — I4891 Unspecified atrial fibrillation: Secondary | ICD-10-CM

## 2013-06-30 DIAGNOSIS — G459 Transient cerebral ischemic attack, unspecified: Secondary | ICD-10-CM

## 2013-06-30 DIAGNOSIS — R42 Dizziness and giddiness: Secondary | ICD-10-CM

## 2013-06-30 DIAGNOSIS — I7774 Dissection of vertebral artery: Secondary | ICD-10-CM

## 2013-06-30 DIAGNOSIS — E669 Obesity, unspecified: Secondary | ICD-10-CM | POA: Insufficient documentation

## 2013-06-30 DIAGNOSIS — I749 Embolism and thrombosis of unspecified artery: Secondary | ICD-10-CM | POA: Insufficient documentation

## 2013-06-30 NOTE — Progress Notes (Signed)
Patient ID: Kirk Oneal, male   DOB: 1950/10/20, 62 y.o.   MRN: [id number] [employer] 30 day cardiac event monitor applied to patient.

## 2013-06-30 NOTE — Progress Notes (Signed)
Echocardiogram performed.  

## 2013-08-07 ENCOUNTER — Other Ambulatory Visit: Payer: Self-pay | Admitting: Neurology

## 2013-08-23 ENCOUNTER — Telehealth: Payer: Self-pay | Admitting: Diagnostic Neuroimaging

## 2013-08-24 ENCOUNTER — Other Ambulatory Visit: Payer: Self-pay

## 2013-08-24 DIAGNOSIS — I7774 Dissection of vertebral artery: Secondary | ICD-10-CM

## 2013-08-24 NOTE — Telephone Encounter (Signed)
Spoke to patient. Placed lab orders.

## 2013-08-30 ENCOUNTER — Other Ambulatory Visit (INDEPENDENT_AMBULATORY_CARE_PROVIDER_SITE_OTHER): Payer: Self-pay

## 2013-08-30 DIAGNOSIS — Z0289 Encounter for other administrative examinations: Secondary | ICD-10-CM

## 2013-08-30 DIAGNOSIS — I7774 Dissection of vertebral artery: Secondary | ICD-10-CM

## 2013-08-31 LAB — CREATININE, SERUM: GFR calc non Af Amer: 93 mL/min/{1.73_m2} (ref >59)

## 2013-09-05 ENCOUNTER — Ambulatory Visit
Admission: RE | Admit: 2013-09-05 | Discharge: 2013-09-05 | Disposition: A | Discharge status: Home / Self Care | Payer: No Typology Code available for payment source | Source: Ambulatory Visit | Attending: Diagnostic Neuroimaging | Admitting: Diagnostic Neuroimaging

## 2013-09-05 DIAGNOSIS — G459 Transient cerebral ischemic attack, unspecified: Secondary | ICD-10-CM

## 2013-09-05 DIAGNOSIS — I7774 Dissection of vertebral artery: Secondary | ICD-10-CM

## 2013-09-05 DIAGNOSIS — R42 Dizziness and giddiness: Secondary | ICD-10-CM

## 2013-09-05 MED ORDER — IOHEXOL 350 MG/ML SOLN
100.0000 mL | Freq: Once | INTRAVENOUS | Status: AC | PRN
  Administered 2013-09-05: 100 mL via INTRAVENOUS

## 2013-09-27 ENCOUNTER — Ambulatory Visit: Payer: PRIVATE HEALTH INSURANCE | Admitting: Neurology

## 2013-10-03 ENCOUNTER — Telehealth: Payer: Self-pay | Admitting: Nurse Practitioner

## 2013-10-03 ENCOUNTER — Ambulatory Visit: Payer: PRIVATE HEALTH INSURANCE | Admitting: Nurse Practitioner

## 2013-10-03 NOTE — Telephone Encounter (Signed)
Patient was no-show for appointment today 

## 2013-10-10 ENCOUNTER — Encounter: Payer: Self-pay | Admitting: Nurse Practitioner

## 2013-10-10 ENCOUNTER — Encounter (INDEPENDENT_AMBULATORY_CARE_PROVIDER_SITE_OTHER): Payer: Self-pay

## 2013-10-10 ENCOUNTER — Ambulatory Visit (INDEPENDENT_AMBULATORY_CARE_PROVIDER_SITE_OTHER): Payer: PRIVATE HEALTH INSURANCE | Admitting: Nurse Practitioner

## 2013-10-10 VITALS — BP 120/70 | HR 95 | Ht 69.0 in | Wt 250.0 lb

## 2013-10-10 DIAGNOSIS — G459 Transient cerebral ischemic attack, unspecified: Secondary | ICD-10-CM

## 2013-10-10 DIAGNOSIS — I7774 Dissection of vertebral artery: Secondary | ICD-10-CM

## 2013-10-10 DIAGNOSIS — R42 Dizziness and giddiness: Secondary | ICD-10-CM

## 2013-10-10 MED ORDER — CLOPIDOGREL BISULFATE 75 MG PO TABS: 75.0000 mg | ORAL_TABLET | Freq: Every day | ORAL | Status: DC

## 2013-10-10 NOTE — Progress Notes (Addendum)
PATIENT: Kirk Oneal DOB: 1951/04/13   REASON FOR VISIT: follow up for vertebral artery dissection. HISTORY FROM: patient  HISTORY OF PRESENT ILLNESS: 63 year old right-handed male with diabetes, here for evaluation of vertebral artery dissection.  4 years ago patient had episode of vertigo with nausea and vomiting. In the past 6 months he has had 3 more episodes. Initially he felt that his left ear was "stopped up". He then had severe spinning, nausea and vomiting sensation. Patient went to medical Dr. for evaluation initially. He then went to the hospital on 04/30/13 for another event. Patient had MRI of the brain which showed no acute stroke but raise the possibility of right vertebral artery occlusion. This is followed up with CT exam of the head and neck which showed thin caliber right vertebral artery, raising possibility for vertebral artery dissection versus atherosclerosis. Patient was on aspirin before this was discovered. He was then discharged on aspirin and Plavix. He saw my colleague Dr. Rexene Alberts on 05/17/13, who recommended continuing dual antiplatelet therapy. Patient is now referred to me for second opinion.  Since last office visit, patient has had one more event of spinning and nausea and vomiting. No double vision, slurred speech, trouble swallowing, syncope. He has some intermittent neck pain in the right posterior region. He continues to have some problems with anxiety and balance difficulty.   UPDATE 10/10/13 (LL): [name] comes to the office for follow up for vertebral artery dissection and to review test results.  His CT Angiogram of head and neck was stable, showing unchanged diminutive right vertebral artery since August. His 30-day cardiac monitor showed NSR. 2D Echo was normal. Lab work was normal except elevated HgbA1c of 8.8 which he is working on.  He has had no further vertigo or other neurological symptoms since last office visit.  He is doing well.   REVIEW OF  SYSTEMS: Full 14 system review of systems performed and notable only for  (Nothing.)  ALLERGIES: No Known Allergies  HOME MEDICATIONS: Outpatient Prescriptions Prior to Visit  Medication Sig Dispense Refill  . [facility] ASPIRIN CHILD 81 MG chewable tablet TAKE 1 TABLET BY MOUTH EVERY DAY  90 tablet  0  . glimepiride (AMARYL) 4 MG tablet Take 1 tablet by mouth daily.      . metFORMIN (GLUCOPHAGE) 1000 MG tablet Take 1,000 mg by mouth 2 (two) times daily with a meal.      . ZETIA 10 MG tablet Take 1 tablet by mouth daily.      . clopidogrel (PLAVIX) 75 MG tablet TAKE 1 TABLET BY MOUTH EVERY DAY  90 tablet  0  . Homeopathic Products (VERTIGOHEEL PO) Take 1 tablet by mouth daily. For swimmers head       No facility-administered medications prior to visit.    PAST MEDICAL HISTORY: Past Medical History  Diagnosis Date  . Diabetes mellitus without complication   . 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  FAMILY HISTORY: Family History  Problem Relation Age of Onset  . Stomach cancer Mother   . Rectal cancer Father     SOCIAL HISTORY: History   Social History  . Marital Status: Married    Spouse Name: sharon    Number of Children: 2  . Years of Education: college   Occupational History  .  [employer]   Social History Main Topics  . Smoking status: Never Smoker   . Smokeless  tobacco: Not on file  . Alcohol Use: Yes     Comment: occasional  . Drug Use: No  . Sexual Activity: Not on file   Other Topics Concern  . Not on file   Social History Narrative  . No narrative on file     PHYSICAL EXAM  Filed Vitals:   10/10/13 0907  BP: 120/70  Pulse: 95  Height: 5\' 9"  (1.753 m)  Weight: 250 lb (113.399 kg)   Body mass index is 36.9 kg/(m^2).  Generalized: Well developed, in no acute distress, obese AA male  Head: normocephalic and atraumatic. Oropharynx benign  Neck: Supple, no carotid bruits    Cardiac: Regular rate rhythm, no murmur  Musculoskeletal: No deformity   Neurological examination  Mentation: Alert oriented to time, place, history taking. Follows all commands speech and language fluent Cranial nerve II-XII:  Pupils were equal round reactive to light extraocular movements were full, visual field were full on confrontational test. Facial sensation and strength were normal. hearing was intact to finger rubbing bilaterally. Uvula tongue midline. head turning and shoulder shrug and were normal and symmetric.Tongue protrusion into cheek strength was normal. Motor: normal bulk and tone, full strength in the BUE, BLE, fine finger movements normal, no pronator drift. No focal weakness Sensory: normal and symmetric to light touch, pinprick, and  vibration  Coordination: finger-nose-finger, heel-to-shin bilaterally, no dysmetria Reflexes:  Deep tendon reflexes in the upper and lower extremities are present and symmetric.  Gait and Station: narrow based gait; able to walk on toes, heels and tandem; romberg is negative  DIAGNOSTIC DATA (LABS, IMAGING, TESTING) - I reviewed patient records, labs, notes, testing and imaging myself where available.  Lab Results  Component Value Date   WBC 8.4 03/18/2013   HGB 13.6 04/30/2013   HCT 40.0 04/30/2013   MCV 98.5* 03/18/2013      Component Value Date/Time   NA 138 04/30/2013 0955   K 4.2 04/30/2013 0955   CL 103 04/30/2013 0955   CO2 27 03/18/2013 1614   GLUCOSE 199* 04/30/2013 0955   BUN 12 08/30/2013 0913   BUN 11 04/30/2013 0955   CREATININE 0.87 08/30/2013 0913   CREATININE 0.87 03/18/2013 1614   CALCIUM 10.1 03/18/2013 1614   PROT 7.2 03/18/2013 1614   ALBUMIN 4.6 03/18/2013 1614   AST 56* 03/18/2013 1614   ALT 97* 03/18/2013 1614   ALKPHOS 64 03/18/2013 1614   BILITOT 0.4 03/18/2013 1614   GFRNONAA 93 08/30/2013 0913   GFRAA 108 08/30/2013 0913   Lab Results  Component Value Date   HDL 38* 06/03/2013   LDLCALC 91 06/03/2013   TRIG 117 06/03/2013    CHOLHDL 4.0 06/03/2013   Lab Results  Component Value Date   HGBA1C 8.8* 06/03/2013   Lab Results  Component Value Date   [id number] 829 06/03/2013   Lab Results  Component Value Date   TSH 2.940 06/03/2013   04/30/13 CTA head - Diminutive irregular right vertebral artery suggestive of result of dissection with atherosclerotic type changes a secondary less likely consideration. Mild narrowing and irregularity of the basilar artery.  Cavernous segment internal carotid artery mild calcified plaque with mild narrowing.   04/30/13 CTA neck - Right vertebral artery is markedly attenuated in caliber, smaller than the size of the transverse foramen. With the clinical history, this is suspicious for dissection with atherosclerotic  type changes a secondary consideration. The right vertebral artery does not appear completely occluded.   04/30/13 MRI brain - Abnormal  appearance of the right vertebral artery which appears occluded. Question if this is related to a dissection or atherosclerotic changes. CT angiogram or MR angiogram can be obtained for further delineation. No acute infarct.  12/15/14CT ANGIO HEAD AND NECK - Unchanged diminutive right vertebral artery since August. As before this could related either to chronic atherosclerotic stenosis or previous dissection. Stable and otherwise negative intracranial CTA.  Stable and normal CT appearance of the brain.  ASSESSMENT AND PLAN 63 y.o. year old male here with intermittent episodes of vertigo, nausea, vomiting, balance difficulty since 2010. 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 This appears to be asymptomatic. Patient has good contralateral vertebral artery flow and flow through the basilar artery. Patient with no recurrent neurological symptoms since last visit.  He is interested in setting up sleep study as discussed with Dr. Rexene Alberts at previous consultation with her, but cannot at present time due to busy work  schedule.  PLAN:  1. Continue clopidogrel 75 mg orally every day  for secondary stroke prevention and maintain strict control of hypertension with blood pressure goal below 130/90, diabetes with hemoglobin A1c goal below 6.5% and lipids with LDL cholesterol goal below 70 mg/dL.  Refills for plavix sent, #90, Refills 3. 2. Agree with risk factor mgmt (diabetes, cholesterol) per PCP. 3. Advised that when he is ready to schedule sleep study with Dr. Rexene Alberts in the future to please call office. 4. Follow up in 6 months.  Meds ordered this encounter  Medications  . clopidogrel (PLAVIX) 75 MG tablet    Sig: Take 1 tablet (75 mg total) by mouth daily.    Dispense:  90 tablet    Refill:  3    Order Specific Question:  Supervising Provider    Answer:  Penni Bombard [[id number]]   Return in about 6 months (around 04/09/2014).  Philmore Pali, MSN, NP-C 10/10/2013, 9:36 AM [facility] [address] [place], [place] [address] [phone]  Note: This document was prepared with digital dictation and possible smart phrase technology. Any transcriptional errors that result from this process are unintentional.  I reviewed the above note and documentation by the Nurse Practitioner and agree with the history, physical exam, assessment and plan as outlined above.

## 2013-10-10 NOTE — Patient Instructions (Signed)
Continue Plavix every day and stop aspirin.  Follow up with your PCP to keep cardiovascular risk factors for stroke under control.  If you wish to come in for a sleep study in the future, please call the office to discuss.  Follow up in our office in 6 months.

## 2013-12-12 ENCOUNTER — Telehealth: Payer: Self-pay | Admitting: Neurology

## 2013-12-12 DIAGNOSIS — I7774 Dissection of vertebral artery: Secondary | ICD-10-CM

## 2013-12-12 DIAGNOSIS — G4733 Obstructive sleep apnea (adult) (pediatric): Secondary | ICD-10-CM

## 2013-12-12 NOTE — Telephone Encounter (Signed)
After reviewing the sleep study referral, I entered a split night sleep study request on this patient, thanks.  Star Age, MD, PhD [facility])

## 2013-12-12 NOTE — Telephone Encounter (Signed)
Patient called stating he would like a sleep study. Patient stated his wife said that he snores and she has witnessed apnea events. Patient had a visit with Dr. Rexene Alberts on 05-17-2013.

## 2014-01-06 ENCOUNTER — Encounter: Payer: Self-pay | Admitting: *Deleted

## 2014-01-06 ENCOUNTER — Ambulatory Visit (INDEPENDENT_AMBULATORY_CARE_PROVIDER_SITE_OTHER): Payer: PRIVATE HEALTH INSURANCE | Admitting: *Deleted

## 2014-01-06 DIAGNOSIS — I7774 Dissection of vertebral artery: Secondary | ICD-10-CM

## 2014-01-06 DIAGNOSIS — G4733 Obstructive sleep apnea (adult) (pediatric): Secondary | ICD-10-CM

## 2014-01-06 DIAGNOSIS — G479 Sleep disorder, unspecified: Secondary | ICD-10-CM

## 2014-01-06 NOTE — Sleep Study (Signed)
See media tab for full report  

## 2014-01-09 ENCOUNTER — Telehealth: Payer: Self-pay | Admitting: Neurology

## 2014-01-09 NOTE — Telephone Encounter (Signed)
Pt is calling stating that he needs to discuss sleep study before report is done, Jeani Hawking, NP out of the office, sending to pt's doctor, Dr. Rexene Alberts. Please advise

## 2014-01-09 NOTE — Telephone Encounter (Signed)
Patient needs to discuss sleep study before write up is done--please call.
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
## 2014-01-20 ENCOUNTER — Ambulatory Visit (INDEPENDENT_AMBULATORY_CARE_PROVIDER_SITE_OTHER): Payer: PRIVATE HEALTH INSURANCE | Admitting: Family Medicine

## 2014-01-20 ENCOUNTER — Encounter: Payer: Self-pay | Admitting: Family Medicine

## 2014-01-20 VITALS — BP 120/80 | HR 81 | Temp 98.3°F | Resp 16 | Ht 68.0 in | Wt 248.4 lb

## 2014-01-20 DIAGNOSIS — E119 Type 2 diabetes mellitus without complications: Secondary | ICD-10-CM

## 2014-01-20 DIAGNOSIS — B360 Pityriasis versicolor: Secondary | ICD-10-CM

## 2014-01-20 DIAGNOSIS — E785 Hyperlipidemia, unspecified: Secondary | ICD-10-CM

## 2014-01-20 LAB — COMPLETE METABOLIC PANEL WITH GFR
AST: 40 U/L — ABNORMAL HIGH (ref 0–37)
Albumin: 4.5 g/dL (ref 3.5–5.2)
Alkaline Phosphatase: 57 U/L (ref 39–117)
BUN: 11 mg/dL (ref 6–23)
CO2: 27 mEq/L (ref 19–32)
Calcium: 9.8 mg/dL (ref 8.4–10.5)
Chloride: 102 mEq/L (ref 96–112)
GFR, Est African American: >89 mL/min
Glucose, Bld: 169 mg/dL — ABNORMAL HIGH (ref 70–99)
Potassium: 4.6 mEq/L (ref 3.5–5.3)

## 2014-01-20 LAB — COMPLETE METABOLIC PANEL WITHOUT GFR
ALT: 68 U/L — ABNORMAL HIGH (ref 0–53)
Creat: 0.95 mg/dL (ref 0.50–1.35)
GFR, Est Non African American: 86 mL/min
Sodium: 137 meq/L (ref 135–145)
Total Bilirubin: 0.4 mg/dL (ref 0.2–1.2)
Total Protein: 7.2 g/dL (ref 6.0–8.3)

## 2014-01-20 LAB — POCT GLYCOSYLATED HEMOGLOBIN (HGB A1C): Hemoglobin A1C: 8.4

## 2014-01-20 MED ORDER — KETOCONAZOLE 2 % EX CREA: 1.0000 "application " | TOPICAL_CREAM | Freq: Every day | CUTANEOUS | Status: DC | PRN

## 2014-01-20 MED ORDER — KETOCONAZOLE 2 % EX SHAM: 1.0000 "application " | MEDICATED_SHAMPOO | CUTANEOUS | Status: DC

## 2014-01-20 NOTE — Progress Notes (Signed)
Chief Complaint:  Chief Complaint  Patient presents with  . rash on neck/chest    diagnosed Tines vesicolor    HPI: Kirk Oneal is a 63 y.o. male who is here for  Rash on his chest for several years, it comes and goes, he has it worse during the summer months and when he sweats. He wants me to fill out [facility] disaibility forms for him.   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 Has not been dizzy since on Plavix.   He has a lot of medical problems he has not followed up on, ie DM , he has not gotten in checked in over 3 months. HE states he gets it checked at the [place] but was seen at [facility] and then they told him he had an oustanding bill so would not see him, he has paid the bill but feels he may not want to go back there. He will try to establish care with Dr Arnoldo Morale who has seen him before at [facility] and now is somewhere elese, he ahs nto found out where yet.  PMH of  DM, XOL, HTN  Past Medical History  Diagnosis Date  . Diabetes mellitus without complication   . 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 History   Social History  . Marital Status: Married    Spouse Name: sharon    Number of Children: 2  . Years of Education: college   Occupational History  .  [employer]   Social History Main Topics  . Smoking status: Never Smoker   . Smokeless tobacco: None  . Alcohol Use: Yes     Comment: occasional  . Drug Use: No  . Sexual Activity: None   Other Topics Concern  . None   Social History Narrative  . None   Family History  Problem Relation Age of Onset  . Stomach cancer Mother   . Rectal cancer Father    No Known Allergies Prior to Admission medications   Medication Sig Start Date End Date Taking? Authorizing Provider  clopidogrel (PLAVIX) 75 MG tablet Take 1 tablet (75 mg total) by mouth daily. 10/10/13   Philmore Pali, NP  [facility] ASP[facility]IN CHILD 81 MG chewable  tablet TAKE 1 TABLET BY MOUTH EVERY DAY 05/25/13   Star Age, MD  glimepiride (AMARYL) 4 MG tablet Take 1 tablet by mouth daily. 03/12/13   Historical Provider, MD  metFORMIN (GLUCOPHAGE) 1000 MG tablet Take 1,000 mg by mouth 2 (two) times daily with a meal.    Historical Provider, MD  ZETIA 10 MG tablet Take 1 tablet by mouth daily. 03/18/13   Historical Provider, MD     ROS: The patient denies fevers, chills, night sweats, unintentional weight loss, chest pain, palpitations, wheezing, dyspnea on exertion, nausea, vomiting, abdominal pain, dysuria, hematuria, melena, numbness, weakness, or tingling.   All other systems have been reviewed and were otherwise negative with the exception of those mentioned in the HPI and as above.    PHYSICAL EXAM: Filed Vitals:   01/20/14 1029  BP: 120/80  Pulse: 81  Temp: 98.3 F (36.8 C)  Resp: 16   Filed Vitals:   01/20/14 1029  Height: 5\' 8"  (1.727 m)  Weight: 248 lb 6.4 oz (112.674 kg)   Body mass index is 37.78 kg/(m^2).  General: Alert, no acute distress HEENT:  Normocephalic, atraumatic, oropharynx patent. EOMI, PERRLA, fundo ogrssly  nl Cardiovascular:  Regular rate and rhythm, no rubs murmurs or gallops.  No Carotid bruits, radial pulse intact. No pedal edema.  Respiratory: Clear to auscultation bilaterally.  No wheezes, rales, or rhonchi.  No cyanosis, no use of accessory musculature GI: No organomegaly, abdomen is soft and non-tender, positive bowel sounds.  No masses. Skin: + tinea rashes. Neurologic: Facial musculature symmetric. Microfilament exam nl. CN 2-12 rossly nl Psychiatric: Patient is appropriate throughout our interaction. Lymphatic: No cervical lymphadenopathy Musculoskeletal: Gait intact.   LABS: Results for orders placed in visit on 01/20/14  POCT GLYCOSYLATED HEMOGLOBIN (HGB A1C)      Result Value Ref Range   Hemoglobin A1C 8.4       EKG/XRAY:   Primary read interpreted by Dr. Marin Comment at  [facility].   ASSESSMENT/PLAN: Encounter Diagnoses  Name Primary?  . Diabetes Yes  . Other and unspecified hyperlipidemia   . Tinea versicolor    Rx Nizarol cream and shampoo Will fill out forms for him, will fill them out earliest will be Wdnesday F/u prn, advise to establish care with primary doctor HbA1c pending, will call him with results.  He is noncomplaint with his Zetia, last lipids were normal. HE already ate today. Will see what hs labs look like .   Gross sideeffects, risk and benefits, and alternatives of medications d/w patient. Patient is aware that all medications have potential sideeffects and we are unable to predict every sideeffect or drug-drug interaction that may occur.  Glenford Bayley, DO 01/20/2014 1:54 PM

## 2014-01-24 ENCOUNTER — Encounter: Payer: Self-pay | Admitting: Family Medicine

## 2014-01-24 ENCOUNTER — Telehealth: Payer: Self-pay | Admitting: Family Medicine

## 2014-01-24 NOTE — Telephone Encounter (Signed)
Spoke to patient about labs. He has an appt in 91 dys with his PCP at [place], he has decided to got here for all his care it sounds like instead of returning to civilian PCP  I have advise him that HBA1c was minimally changed, he needs to watch his diet and exercise and continue with metformin 1gram BID and also with Amaryl.  He alsio has mildly elevated liver enzymes, I  think this is all fatty liver related, he ahs not been on Zetia for hyperlipidemia. Advise to take Zetia and get his fasting lipids rechecked and also CMP rechecked in 30 days with PCP. I filled out his forms for the tinea dn he will pick it. LEft it in [place] box.

## 2014-01-25 ENCOUNTER — Telehealth: Payer: Self-pay | Admitting: Neurology

## 2014-01-25 DIAGNOSIS — G4733 Obstructive sleep apnea (adult) (pediatric): Secondary | ICD-10-CM

## 2014-01-25 NOTE — Telephone Encounter (Signed)
Please call and notify patient that the recent sleep study confirmed the diagnosis of OSA. He did adequately with CPAP during the study with improvement of the respiratory events. Therefore, I would like start the patient on CPAP at home. I placed the order in the chart.   Arrange for CPAP set up at home through a DME company of patient's choice and fax/route report to PCP and referring MD (if other than PCP).   The patient will also need a follow up appointment with me in 6-8 weeks post set up that has to be scheduled; help the patient schedule this (in a follow-up slot).   Please re-enforce the importance of compliance with treatment and the need for [place] to monitor compliance data.   Once you have spoken to the patient and scheduled the return appointment, you may close this encounter, thanks,   Star Age, MD, PhD [facility] ([address])

## 2014-01-26 NOTE — Telephone Encounter (Signed)
Closing encounter

## 2014-01-26 NOTE — Telephone Encounter (Signed)
I called and left a message for the patient about his recent sleep study results. I informed the patient that the study confirmed the diagnosis of obstructive sleep apnea and that he well with CPAP during the study. Dr.Athar recommend CPAP therapy at home, so I will wait for a callback from the patient to inform me of which durable medical equipment of his choice. I will fax a copy of the report to Dr. Delfina Redwood and Dr. Montez Morita offices and mail a copy to the patient along with a follow up instruction letter.

## 2014-01-27 NOTE — Telephone Encounter (Signed)
I called and left a message for the patient to callback to inform me which DME company he prefer his CPAP order to be fax.

## 2014-01-30 ENCOUNTER — Encounter: Payer: Self-pay | Admitting: *Deleted

## 2014-01-30 NOTE — Telephone Encounter (Signed)
I called and spoke with the patient about where to send his CPAP order. I will send his CPAP order to [facility].

## 2014-02-15 ENCOUNTER — Other Ambulatory Visit: Payer: Self-pay | Admitting: Neurology

## 2014-02-21 ENCOUNTER — Other Ambulatory Visit: Payer: Self-pay | Admitting: Neurology

## 2014-02-23 ENCOUNTER — Other Ambulatory Visit: Payer: Self-pay | Admitting: Neurology

## 2014-03-01 ENCOUNTER — Telehealth: Payer: Self-pay | Admitting: Neurology

## 2014-03-01 NOTE — Telephone Encounter (Signed)
Please call and advise patient that I am in receipt of disability paperwork for sleep apnea through the [facility] [employer] [facility]. Please advise patient that I'm not comfortable filling in the paperwork out as I have not seen him since August 2014 and not yet seen him in followup for the actual problem of obstructive sleep apnea. Please advise him that I would like to see him back in followup after his CPAP therapy has been established. When I first met him in August I did not see him for obstructive sleep apnea and we ordered a sleep study recently. I would really have to see him back in followup first.

## 2014-03-01 NOTE — Telephone Encounter (Signed)
I called and left a message for the patient to callback to the office to schedule his Post CPAP visit with Dr. Rexene Alberts. I also informed the patient that Dr. Rexene Alberts wasn't comfortable filling out his disability paperwork for sleep apnea because she hadn't seen him since August 2014. So she needs to see him prior to filling out his disability paperwork for sleep apnea.

## 2014-03-02 ENCOUNTER — Telehealth: Payer: Self-pay | Admitting: Neurology

## 2014-03-02 NOTE — Telephone Encounter (Signed)
I called and spoke with the patient about scheduling his Post CPAP visit with Dr. Rexene Alberts. Patient stated that he received his CPAP machine 2 weeks ago, but was having trouble with putting on the mask due to [employer] issue. I informed the patient that Dr. Rexene Alberts will be getting a 30 day download from [place], so he needs to try to using his machine. Patient understood and stated he would try again tonight to use his CPAP machine. Patient is scheduled for a follow up visit with Dr. Rexene Alberts on 03-23-2014@10 :30 am with an arrival time of 10:15 am.

## 2014-03-10 ENCOUNTER — Ambulatory Visit (INDEPENDENT_AMBULATORY_CARE_PROVIDER_SITE_OTHER): Payer: PRIVATE HEALTH INSURANCE | Admitting: Family Medicine

## 2014-03-10 ENCOUNTER — Encounter: Payer: Self-pay | Admitting: Family Medicine

## 2014-03-10 VITALS — BP 110/64 | HR 76 | Temp 98.4°F | Resp 16 | Ht 68.0 in | Wt 249.6 lb

## 2014-03-10 DIAGNOSIS — E1165 Type 2 diabetes mellitus with hyperglycemia: Secondary | ICD-10-CM

## 2014-03-10 DIAGNOSIS — IMO0002 Reserved for concepts with insufficient information to code with codable children: Secondary | ICD-10-CM

## 2014-03-10 DIAGNOSIS — E785 Hyperlipidemia, unspecified: Secondary | ICD-10-CM

## 2014-03-10 DIAGNOSIS — E118 Type 2 diabetes mellitus with unspecified complications: Principal | ICD-10-CM

## 2014-03-10 MED ORDER — GLIMEPIRIDE 4 MG PO TABS: 4.0000 mg | ORAL_TABLET | Freq: Every day | ORAL | Status: DC

## 2014-03-10 NOTE — Progress Notes (Signed)
Chief Complaint:  Chief Complaint  Patient presents with  . Medication Refill    glimepiride    HPI: Kirk Oneal is a 63 y.o. male who is here for  daibetes meds. He is  out of his Amryl HE did not eat and had hypoglycemia x 1 episode 2 month agos , low sugar was 50 HE is on metfrormin and has been takin glyburide but he states theat does not site well with his stomach, he has no problems with the Amaryl  HE was supposed to get thsi from the [place] since they manage his DM but they on have glyburide and not Amrayl on formulary.  He has not gotten his lipids rechecked or his CMP like we talked about the last time since he has not established care with a new PCP No Se than the above.    Past Medical History  Diagnosis Date  . Diabetes mellitus without complication   . 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 History   Social History  . Marital Status: Married    Spouse Name: sharon    Number of Children: 2  . Years of Education: college   Occupational History  .  [employer]   Social History Main Topics  . Smoking status: Never Smoker   . Smokeless tobacco: None  . Alcohol Use: Yes     Comment: occasional  . Drug Use: No  . Sexual Activity: None   Other Topics Concern  . None   Social History Narrative  . None   Family History  Problem Relation Age of Onset  . Stomach cancer Mother   . Rectal cancer Father    No Known Allergies Prior to Admission medications   Medication Sig Start Date End Date Taking? Authorizing Provider  clopidogrel (PLAVIX) 75 MG tablet Take 1 tablet (75 mg total) by mouth daily. 10/10/13  Yes Philmore Pali, NP  [facility] ASP[facility] CH[facility] 81 MG chewable tablet TAKE 1 TABLET BY MOUTH EVERY DAY 05/25/13  Yes Star Age, MD  glimepiride (AMARYL) 4 MG tablet Take 1 tablet by mouth daily. 03/12/13  Yes Historical Provider, MD  ketoconazole (NIZORAL) 2 % cream Apply 1 application topically daily as needed for  irritation. 2 weeks at a time. 01/20/14  Yes Thao P Le, DO  ketoconazole (NIZORAL) 2 % shampoo Apply 1 application topically 2 (two) times a week. 01/20/14  Yes Thao P Le, DO  metFORMIN (GLUCOPHAGE) 1000 MG tablet Take 1,000 mg by mouth 2 (two) times daily with a meal.   Yes Historical Provider, MD  ZETIA 10 MG tablet Take 1 tablet by mouth daily. 03/18/13  Yes Historical Provider, MD     ROS: The patient denies fevers, chills, night sweats, unintentional weight loss, chest pain, palpitations, wheezing, dyspnea on exertion, nausea, vomiting, abdominal pain, dysuria, hematuria, melena, numbness, weakness, or tingling.   All other systems have been reviewed and were otherwise negative with the exception of those mentioned in the HPI and as above.    PHYSICAL EXAM: Filed Vitals:   03/10/14 1031  BP: 110/64  Pulse: 76  Temp: 98.4 F (36.9 C)  Resp: 16   Filed Vitals:   03/10/14 1031  Height: 5\' 8"  (1.727 m)  Weight: 249 lb 9.6 oz (113.218 kg)   Body mass index is 37.96 kg/(m^2).  General: Alert, no acute distress HEENT:  Normocephalic, atraumatic, oropharynx patent. EOMI, PERRLA Cardiovascular:  Regular rate  and rhythm, no rubs murmurs or gallops.  No Carotid bruits, radial pulse intact. No pedal edema.  Respiratory: Clear to auscultation bilaterally.  No wheezes, rales, or rhonchi.  No cyanosis, no use of accessory musculature GI: No organomegaly, abdomen is soft and non-tender, positive bowel sounds.  No masses. Skin: No rashes. Neurologic: Facial musculature symmetric. Psychiatric: Patient is appropriate throughout our interaction. Lymphatic: No cervical lymphadenopathy Musculoskeletal: Gait intact.   LABS: Results for orders placed in visit on 01/20/14  COMPLETE METABOLIC PANEL WITH GFR      Result Value Ref Range   Sodium 137  135 - 145 mEq/L   Potassium 4.6  3.5 - 5.3 mEq/L   Chloride 102  96 - 112 mEq/L   CO2 27  19 - 32 mEq/L   Glucose, Bld 169 (*) 70 - 99 mg/dL   BUN  11  6 - 23 mg/dL   Creat 0.95  0.50 - 1.35 mg/dL   Total Bilirubin 0.4  0.2 - 1.2 mg/dL   Alkaline Phosphatase 57  39 - 117 U/L   AST 40 (*) 0 - 37 U/L   ALT 68 (*) 0 - 53 U/L   Total Protein 7.2  6.0 - 8.3 g/dL   Albumin 4.5  3.5 - 5.2 g/dL   Calcium 9.8  8.4 - 10.5 mg/dL   GFR, Est African American >89     GFR, Est Non African American 86    POCT GLYCOSYLATED HEMOGLOBIN (HGB A1C)      Result Value Ref Range   Hemoglobin A1C 8.4       EKG/XRAY:   Primary read interpreted by Dr. Marin Comment at [facility].   ASSESSMENT/PLAN: Encounter Diagnoses  Name Primary?  . Type II or unspecified type diabetes mellitus with unspecified complication, uncontrolled Yes  . Other and unspecified hyperlipidemia    IF he does not need zetia then dc Refilled Amaryl Labs pending: CMP, Lipids, TSH HE will f.u with [place] in 2-3 months if he does not establish care with a new PCP  Gross sideeffects, risk and benefits, and alternatives of medications d/w patient. Patient is aware that all medications have potential sideeffects and we are unable to predict every sideeffect or drug-drug interaction that may occur.  LE, [place], DO 03/10/2014 11:11 AM

## 2014-03-11 LAB — LIPID PANEL
Cholesterol: 153 mg/dL (ref 0–200)
HDL: 38 mg/dL — ABNORMAL LOW (ref >39)
LDL Cholesterol: 92 mg/dL (ref 0–99)
Total CHOL/HDL Ratio: 4 ratio
Triglycerides: 114 mg/dL (ref <150)
VLDL: 23 mg/dL (ref 0–40)

## 2014-03-11 LAB — COMPREHENSIVE METABOLIC PANEL
Alkaline Phosphatase: 51 U/L (ref 39–117)
BUN: 13 mg/dL (ref 6–23)
CO2: 25 mEq/L (ref 19–32)
Creat: 0.88 mg/dL (ref 0.50–1.35)
Glucose, Bld: 118 mg/dL — ABNORMAL HIGH (ref 70–99)
Potassium: 4.3 mEq/L (ref 3.5–5.3)
Sodium: 137 mEq/L (ref 135–145)
Total Bilirubin: 0.4 mg/dL (ref 0.2–1.2)
Total Protein: 6.6 g/dL (ref 6.0–8.3)

## 2014-03-11 LAB — TSH: TSH: 2.888 u[IU]/mL (ref 0.350–4.500)

## 2014-03-11 LAB — COMPREHENSIVE METABOLIC PANEL WITH GFR
ALT: 62 U/L — ABNORMAL HIGH (ref 0–53)
AST: 39 U/L — ABNORMAL HIGH (ref 0–37)
Albumin: 4.5 g/dL (ref 3.5–5.2)
Calcium: 9.5 mg/dL (ref 8.4–10.5)
Chloride: 103 meq/L (ref 96–112)

## 2014-03-19 ENCOUNTER — Encounter: Payer: Self-pay | Admitting: Family Medicine

## 2014-03-23 ENCOUNTER — Encounter (INDEPENDENT_AMBULATORY_CARE_PROVIDER_SITE_OTHER): Payer: Self-pay

## 2014-03-23 ENCOUNTER — Encounter: Payer: Self-pay | Admitting: Neurology

## 2014-03-23 ENCOUNTER — Ambulatory Visit (INDEPENDENT_AMBULATORY_CARE_PROVIDER_SITE_OTHER): Payer: PRIVATE HEALTH INSURANCE | Admitting: Neurology

## 2014-03-23 VITALS — BP 145/84 | HR 79 | Temp 98.2°F | Ht 68.0 in | Wt 256.0 lb

## 2014-03-23 DIAGNOSIS — I7774 Dissection of vertebral artery: Secondary | ICD-10-CM

## 2014-03-23 DIAGNOSIS — G4733 Obstructive sleep apnea (adult) (pediatric): Secondary | ICD-10-CM

## 2014-03-23 DIAGNOSIS — Z9989 Dependence on other enabling machines and devices: Principal | ICD-10-CM

## 2014-03-23 DIAGNOSIS — E669 Obesity, unspecified: Secondary | ICD-10-CM

## 2014-03-23 NOTE — Patient Instructions (Addendum)
Please continue using your CPAP regularly. While your insurance requires that you use CPAP at least 4 hours each night on 70% of the nights, I recommend, that you not skip any nights and use it throughout the night if you can. Getting used to CPAP and staying with the treatment long term does take time and patience and discipline. Untreated obstructive sleep apnea when it is moderate to severe can have an adverse impact on cardiovascular health and raise her risk for heart disease, arrhythmias, hypertension, congestive heart failure, stroke and diabetes. Untreated obstructive sleep apnea causes sleep disruption, nonrestorative sleep, and sleep deprivation. This can have an impact on your day to day functioning and cause daytime sleepiness and impairment of cognitive function, memory loss, mood disturbance, and problems focussing. Using CPAP regularly can improve these symptoms.  i would like to see you back in 3 months. We may decide to put you on a set CPAP pressure at the time.   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 Leta Baptist or Jeani Hawking.

## 2014-03-23 NOTE — Progress Notes (Signed)
Subjective:    Patient ID: Kirk Oneal is a 63 y.o. male.  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 He is unaccompanied today. I first met him on 05/17/2013 at which time he reported a recent emergency room visit on 04/29/2013 with a six-week history of dizziness. At the time of his first visit with me I suggested an evaluation with my colleague Dr. Leta Baptist and he saw him on 06/03/2013 with further imaging studies requested. I ordered a sleep study, as he reported snoring, and nonrestorative sleep. He had a split-night sleep study on 01/06/2014 and I went over his test results with him in detail today. His baseline sleep efficiency was reduced at 80% with a latency to sleep of 1.5 minutes and wake after sleep onset of 5.5 minutes with mild sleep fragmentation noted. He had an increased percentage of stage II sleep, absence of slow-wave sleep and 21.3% of REM sleep with a reduced REM latency of 3.5 minutes. He had no significant periodic leg movements or EKG changes. Mild to moderate snoring was noted. His total AHI was 33 per hour. Baseline oxygen saturation was 90%, nadir was 75% in REM sleep. He was then titrated on CPAP. He had a very low sleep efficiency. He had 2 very long periods of wakefulness. He had an increased percentage of light stage sleep, absence of slow-wave sleep, and 17.3% of REM sleep. Average oxygen saturation was 93%, nadir was 83%. Time below 88% saturation was 43 seconds. Snoring was eliminated. He was started on CPAP of 5 cm and titrated to 7 cm. He did have a resolution of his sleep disordered breathing but very poor sleep consolidation and I felt this was not an adequate CPAP titration. I therefore placed the patient on autoPAP.  Today, I reviewed  the patient's CPAP compliance data from 02/21/14 to 03/22/14, which is a total of 30 days, during which time the patient used CPAP on 21 nights, but consistently in the last 21 days. The average usage for all days was only 1 hour and 59 minutes but for the last couple of weeks when he used it consistently the average usage was 2 hours and 50 minutes. His AutoPap is set between 7 and 12 with CPR of 2 and 95th percentile pressure is 10.4 cm leak is low at 7.1 L per minute at the 95th percentile and AHI low at 1.4 per hour indicating adequate settings at this time.  Today he reports, that he had initially a very difficult time adjusting to the mask as he has PTSD, now, in the last 2 weeks, he has not skipped it. He admits that he still is working through his anxiety with the mask. He has been able to tolerate the nasal mask fairly well. He is not always allowing for enough sleep time he admits. He is working through his issues with CPAP tolerance but overall does endorse that he sleeps better with it. He feels better rested. Thankfully he has not had any interim vertigo or dizziness and feels much improved in that regard. He has a form from the [place] regarding his sleep apnea and treatment that he requests for me to fill out which I will do.  He went to [facility] in July 2014, and was told he had a UTI. He  went back and was given an Rx for meclizine, which did help in the beginning, but then stopped helping and he went to the ER on 04/29/13 he went to the ER and wanted to get checked out completely as he had recurrent Sx without definitive answer. He had been taking a baby aspirin for about 8 months per PCP. He has had prior episodes of dizziness about 3 times in the last 6 months and one time some 4 years ago, lasting for hours at a time. He reported no recurrent headaches, but does report a slight neck since July. He went to the chiropractor at the end of July and has had some chiropractic treatment including neck manipulation  one time, however, his symptoms were present before and did not get worse after. 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 His vertiginous symptoms are made worse with head movements. He has no current vertigo, but does not feel completely right either he states. He had a CT angiogram head and neck on 04/30/2013 which showed right vertebral artery to be markedly attenuated in caliber, smaller than the size of the transverse foramen. There was suspicion for dissection. CTA head showed no significant abnormalities. MRI brain without contrast showed abnormal appearance of the right vertebral artery which appears occluded and no acute infarct was seen. Neurology was consulted while the patient was in the emergency room and recommended close followup outpatient and addition of Plavix.   His Past Medical History Is Significant For: Past Medical History  Diagnosis Date  . Diabetes mellitus without complication   . 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  His Family History Is Significant For: Family History  Problem Relation Age of Onset  . Stomach cancer Mother   . Rectal cancer Father     His Social History Is Significant For: History   Social History  . Marital Status: Married    Spouse Name: sharon    Number of Children: 2  . Years of Education: college   Occupational History  .  [employer]   Social History Main Topics  . Smoking status: Never Smoker   . Smokeless tobacco: None  . Alcohol Use: Yes     Comment: occasional  . Drug Use: No  . Sexual Activity: None   Other Topics Concern  . None   Social History Narrative  . None    His Allergies Are:  No Known Allergies:   His Current Medications Are:  Outpatient Encounter Prescriptions as of 03/23/2014  Medication Sig  . clopidogrel  (PLAVIX) 75 MG tablet Take 1 tablet (75 mg total) by mouth daily.  . [facility] ASPIRIN CHILD 81 MG chewable tablet TAKE 1 TABLET BY MOUTH EVERY DAY  . glimepiride (AMARYL) 4 MG tablet Take 1 tablet (4 mg total) by mouth daily. Take with food. Monitor for low sugar  . glyBURIDE (DIABETA) 5 MG tablet Take 1 tablet by mouth daily.  Marland Kitchen ketoconazole (NIZORAL) 2 % cream Apply 1 application topically daily as needed for irritation. 2 weeks at a time.  Marland Kitchen ketoconazole (NIZORAL) 2 % shampoo Apply 1 application topically 2 (two) times a week.  . meloxicam (MOBIC) 15 MG tablet Take 1 tablet by mouth daily.  . metFORMIN (GLUCOPHAGE) 1000 MG tablet Take 1,000 mg by mouth 2 (two)  times daily with a meal.  . VIAGRA 100 MG tablet Take 1 tablet by mouth as needed.  Marland Kitchen ZETIA 10 MG tablet Take 1 tablet by mouth daily.  :  Review of Systems:  Out of a complete 14 point review of systems, all are reviewed and negative with the exception of these symptoms as listed below:   Review of Systems  Constitutional: Negative.   HENT: Negative.   Eyes: Negative.   Respiratory: Negative.   Cardiovascular: Negative.   Gastrointestinal: Negative.   Endocrine: Negative.   Genitourinary: Negative.   Musculoskeletal: Negative.   Skin: Negative.   Allergic/Immunologic: Negative.   Neurological: Negative.   Hematological: Negative.   Psychiatric/Behavioral: Positive for sleep disturbance (apnea, shift work) and dysphoric mood. The patient is hyperactive.     Objective:  Neurologic Exam  Physical Exam Physical Examination:   Filed Vitals:   03/23/14 1025  BP: 145/84  Pulse: 79  Temp: 98.2 F (36.8 C)    General Examination: The patient is a very pleasant 63 y.o. male in no acute distress. He appears well-developed and well-nourished and well groomed. He is overweight.   HEENT: Normocephalic, atraumatic, pupils are equal, round and reactive to light and accommodation. Funduscopic exam is normal with sharp disc margins  noted. Extraocular tracking is good without limitation to gaze excursion or nystagmus noted. Normal smooth pursuit is noted. Hearing is grossly intact. Face is symmetric with normal facial animation and normal facial sensation. Speech is clear with no dysarthria noted. There is no hypophonia. There is no lip, neck/head, jaw or voice tremor. Neck is supple with full range of passive and active motion. There are no carotid bruits on auscultation. Oropharynx exam reveals: mild mouth dryness, adequate dental hygiene and marked airway crowding, due to redundant soft palate and large her uvula, tonsillar size are 1+, large tongue. Mallampati is class III. Tongue protrudes centrally and palate elevates symmetrically. Neck size is 18.5 inches.   Chest: Clear to auscultation without wheezing, rhonchi or crackles noted.  Heart: S1+S2+0, regular and normal without murmurs, rubs or gallops noted.   Abdomen: Soft, non-tender and non-distended with normal bowel sounds appreciated on auscultation.  Extremities: There is no pitting edema in the distal lower extremities bilaterally. Pedal pulses are intact.  Skin: Warm and dry without trophic changes noted. There are no varicose veins.  Musculoskeletal: exam reveals no obvious joint deformities, tenderness or joint swelling or erythema.   Neurologically:  Mental status: The patient is awake, alert and oriented in all 4 spheres. His memory, attention, language and knowledge are appropriate. There is no aphasia, agnosia, apraxia or anomia. Speech is clear with normal prosody and enunciation. Thought process is linear. Mood is congruent and affect is normal.  Cranial nerves are as described above under HEENT exam. In addition, shoulder shrug is normal with equal shoulder height noted. Motor exam: Normal bulk, strength and tone is noted. There is no drift, tremor or rebound. Romberg is negative, he has no vertigo. Reflexes are 2+ throughout. Toes are downgoing  bilaterally. Fine motor skills are intact with normal finger taps, normal hand movements, normal rapid alternating patting, normal foot taps and normal foot agility.  Cerebellar testing shows no dysmetria or intention tremor on finger to nose testing. Heel to shin is unremarkable bilaterally. There is no truncal or gait ataxia.  Sensory exam is intact to light touch, pinprick, vibration, temperature sense in the upper and lower extremities.  Gait, station and balance are unremarkable. No veering to  one side is noted. No leaning to one side is noted. Posture is age-appropriate and stance is narrow based. No problems turning are noted. He turns en bloc. Tandem walk is unremarkable. Intact toe stance is noted.                Assessment and Plan:   In summary, CUNG MASTERSON is a very pleasant 63 y.o.-year old male with a history of diabetes, and obesity and hyperlipidemia and vertebral dissection. He is doing fairly well at this time and he has in the interim been diagnosed with severe sleep apnea. I talked to him about the sleep test results in detail today. I also explained his compliance data to him in detail. I have asked him to allow for more sleep time and gradually try to increase the time on the CPAP. He has done well in the last couple of weeks and is commended for trying and congratulated on working through his anxiety with CPAP treatment. It looks like his adequate pressure may be right around 10 but I would like to see a longer treatment time before we make a change from auto Pap to set pressure. For his vascular disease risk factor modification and followup he is also scheduled with Dr. Leta Baptist later this month. He is advised to keep that appointment. Thankfully he is doing better in all aspects. He is diabetic and his fasting blood sugar values are in the 120s. I think this can be improved a little bit and he is working with his primary care physician on this. I would like to see him back in 3  months from now, sooner if the need arises. He is encouraged to call us with any interim questions, concerns, problems or updates. I answered all his questions today and the patient was in agreement with the above outlined plan.

## 2014-04-10 ENCOUNTER — Ambulatory Visit: Payer: PRIVATE HEALTH INSURANCE | Admitting: Nurse Practitioner

## 2014-04-12 ENCOUNTER — Telehealth: Payer: Self-pay | Admitting: Neurology

## 2014-04-12 NOTE — Telephone Encounter (Signed)
Will route to [facility] to see if pt can have call returned today, if not, will forward request for dme to evaluate.

## 2014-04-14 DIAGNOSIS — Z0289 Encounter for other administrative examinations: Secondary | ICD-10-CM

## 2014-04-15 ENCOUNTER — Ambulatory Visit (INDEPENDENT_AMBULATORY_CARE_PROVIDER_SITE_OTHER): Payer: PRIVATE HEALTH INSURANCE | Admitting: Family Medicine

## 2014-04-15 ENCOUNTER — Ambulatory Visit (INDEPENDENT_AMBULATORY_CARE_PROVIDER_SITE_OTHER): Payer: PRIVATE HEALTH INSURANCE

## 2014-04-15 VITALS — BP 140/76 | HR 90 | Temp 98.3°F | Resp 18 | Ht 68.5 in | Wt 249.8 lb

## 2014-04-15 DIAGNOSIS — R059 Cough, unspecified: Secondary | ICD-10-CM

## 2014-04-15 DIAGNOSIS — H6690 Otitis media, unspecified, unspecified ear: Secondary | ICD-10-CM

## 2014-04-15 DIAGNOSIS — J22 Unspecified acute lower respiratory infection: Secondary | ICD-10-CM

## 2014-04-15 DIAGNOSIS — J209 Acute bronchitis, unspecified: Secondary | ICD-10-CM

## 2014-04-15 DIAGNOSIS — H6692 Otitis media, unspecified, left ear: Secondary | ICD-10-CM

## 2014-04-15 DIAGNOSIS — J988 Other specified respiratory disorders: Secondary | ICD-10-CM

## 2014-04-15 DIAGNOSIS — R05 Cough: Secondary | ICD-10-CM

## 2014-04-15 MED ORDER — BENZONATATE 100 MG PO CAPS: 200.0000 mg | ORAL_CAPSULE | Freq: Two times a day (BID) | ORAL | Status: DC | PRN

## 2014-04-15 MED ORDER — AMOXICILLIN-POT CLAVULANATE 875-125 MG PO TABS: 1.0000 | ORAL_TABLET | Freq: Two times a day (BID) | ORAL | Status: DC

## 2014-04-15 MED ORDER — HYDROCODONE-HOMATROPINE 5-1.5 MG/5ML PO SYRP: 5.0000 mL | ORAL_SOLUTION | Freq: Every evening | ORAL | Status: DC | PRN

## 2014-04-15 NOTE — Patient Instructions (Signed)
Acute Bronchitis °Bronchitis is inflammation of the airways that extend from the windpipe into the lungs (bronchi). The inflammation often causes mucus to develop. This leads to a cough, which is the most common symptom of bronchitis.  °In acute bronchitis, the condition usually develops suddenly and goes away over time, usually in a couple weeks. Smoking, allergies, and asthma can make bronchitis worse. Repeated episodes of bronchitis may cause further lung problems.  °CAUSES °Acute bronchitis is most often caused by the same virus that causes a cold. The virus can spread from person to person (contagious) through coughing, sneezing, and touching contaminated objects. °SIGNS AND SYMPTOMS  °· Cough.   °· Fever.   °· Coughing up mucus.   °· Body aches.   °· Chest congestion.   °· Chills.   °· Shortness of breath.   °· Sore throat.   °DIAGNOSIS  °Acute bronchitis is usually diagnosed through a physical exam. Your health care provider will also ask you questions about your medical history. Tests, such as chest X-rays, are sometimes done to rule out other conditions.  °TREATMENT  °Acute bronchitis usually goes away in a couple weeks. Oftentimes, no medical treatment is necessary. Medicines are sometimes given for relief of fever or cough. Antibiotic medicines are usually not needed but may be prescribed in certain situations. In some cases, an inhaler may be recommended to help reduce shortness of breath and control the cough. A cool mist vaporizer may also be used to help thin bronchial secretions and make it easier to clear the chest.  °HOME CARE INSTRUCTIONS °· Get plenty of rest.   °· Drink enough fluids to keep your urine clear or pale yellow (unless you have a medical condition that requires fluid restriction). Increasing fluids may help thin your respiratory secretions (sputum) and reduce chest congestion, and it will prevent dehydration.   °· Take medicines only as directed by your health care provider. °· If  you were prescribed an antibiotic medicine, finish it all even if you start to feel better. °· Avoid smoking and secondhand smoke. Exposure to cigarette smoke or irritating chemicals will make bronchitis worse. If you are a smoker, consider using nicotine gum or skin patches to help control withdrawal symptoms. Quitting smoking will help your lungs heal faster.   °· Reduce the chances of another bout of acute bronchitis by washing your hands frequently, avoiding people with cold symptoms, and trying not to touch your hands to your mouth, nose, or eyes.   °· Keep all follow-up visits as directed by your health care provider.   °SEEK MEDICAL CARE IF: °Your symptoms do not improve after 1 week of treatment.  °SEEK IMMEDIATE MEDICAL CARE IF: °· You develop an increased fever or chills.   °· You have chest pain.   °· You have severe shortness of breath. °· You have bloody sputum.   °· You develop dehydration. °· You faint or repeatedly feel like you are going to pass out. °· You develop repeated vomiting. °· You develop a severe headache. °MAKE SURE YOU:  °· Understand these instructions. °· Will watch your condition. °· Will get help right away if you are not doing well or get worse. °Document Released: 10/16/2004 Document Revised: 01/23/2014 Document Reviewed: 03/01/2013 °Exit[employer]® Patient Information ©2015 [employer]. This information is not intended to replace advice given to you by your health care provider. Make sure you discuss any questions you have with your health care provider. ° °

## 2014-04-15 NOTE — Progress Notes (Signed)
Chief Complaint:  Chief Complaint  Patient presents with  . Cough    non-productive, x1 week  . Nasal Congestion    x1 week  . Otalgia    fullness/discomfort L ear, started this morning    HPI: Kirk Oneal is a 63 y.o. male who is here for "chest cold for 1 week" , treating with vicks vapor rub,  Without relief  He wants  Me to know that he does not use his AC, he has not been around sick contacts   He thinks it maybe his CPAP since it dries him out, her states he does use his humidifier. He has had it for 1 month ow and has not cleaned it.  He also has left ear pain ,which started this morning He doesnot have any facial pain, sore throat. He has a nonproductive cough. He has wheezing intermittently but  Denies CP or SOB.    Past Medical History  Diagnosis Date  . Diabetes mellitus without complication   . Dissection of vertebral artery 05/17/2013   History reviewed. No pertinent past surgical history. History   Social History  . Marital Status: Married    Spouse Name: sharon    Number of Children: 2  . Years of Education: college   Occupational History  .  [employer]   Social History Main Topics  . Smoking status: Never Smoker   . Smokeless tobacco: None  . Alcohol Use: Yes     Comment: occasional  . Drug Use: No  . Sexual Activity: None   Other Topics Concern  . None   Social History Narrative  . None   Family History  Problem Relation Age of Onset  . Stomach cancer Mother   . Rectal cancer Father    No Known Allergies Prior to Admission medications   Medication Sig Start Date End Date Taking? Authorizing Provider  clopidogrel (PLAVIX) 75 MG tablet Take 1 tablet (75 mg total) by mouth daily. 10/10/13  Yes Philmore Pali, NP  [facility] ASPIRIN CHILD 81 MG chewable tablet TAKE 1 TABLET BY MOUTH EVERY DAY 05/25/13  Yes Star Age, MD  glimepiride (AMARYL) 4 MG tablet Take 1 tablet (4 mg total) by mouth daily. Take with food. Monitor for low sugar  03/10/14  Yes Alysia Scism P Mallori Araque, DO  glyBURIDE (DIABETA) 5 MG tablet Take 1 tablet by mouth daily. 02/18/14  Yes Historical Provider, MD  ketoconazole (NIZORAL) 2 % cream Apply 1 application topically daily as needed for irritation. 2 weeks at a time. 01/20/14  Yes Kouper Spinella P Earnest Mcgillis, DO  ketoconazole (NIZORAL) 2 % shampoo Apply 1 application topically 2 (two) times a week. 01/20/14  Yes Jerran Tappan P Hence Derrick, DO  meloxicam (MOBIC) 15 MG tablet Take 1 tablet by mouth daily. 02/07/14  Yes Historical Provider, MD  metFORMIN (GLUCOPHAGE) 1000 MG tablet Take 1,000 mg by mouth 2 (two) times daily with a meal.   Yes Historical Provider, MD  VIAGRA 100 MG tablet Take 1 tablet by mouth as needed. 02/07/14  Yes Historical Provider, MD  ZETIA 10 MG tablet Take 1 tablet by mouth daily. 03/18/13  Yes Historical Provider, MD     ROS: The patient denies fevers, chills, night sweats, unintentional weight loss, chest pain, palpitations, wheezing, dyspnea on exertion, nausea, vomiting, abdominal pain, dysuria, hematuria, melena, numbness, weakness, or tingling.   All other systems have been reviewed and were otherwise negative with the exception of those mentioned in the HPI  and as above.    PHYSICAL EXAM: Filed Vitals:   04/15/14 0954  BP: 140/76  Pulse: 90  Temp: 98.3 F (36.8 C)  Resp: 18   Filed Vitals:   04/15/14 0954  Height: 5' 8.5" (1.74 m)  Weight: 249 lb 12.8 oz (113.309 kg)   Body mass index is 37.43 kg/(m^2).  General: Alert, no acute distress HEENT:  Normocephalic, atraumatic, oropharynx patent. EOMI, PERRLA. LEft TM + erythematous, no exudates.  Cardiovascular:  Regular rate and rhythm, no rubs murmurs or gallops.  No Carotid bruits, radial pulse intact. No pedal edema.  Respiratory: Clear to auscultation bilaterally.  No wheezes, rales,  + slight  rhonchi.  No cyanosis, no use of accessory musculature GI: No organomegaly, abdomen is soft and non-tender, positive bowel sounds.  No masses. Skin: No rashes. Neurologic:  Facial musculature symmetric. Psychiatric: Patient is appropriate throughout our interaction. Lymphatic: No cervical lymphadenopathy Musculoskeletal: Gait intact.    LABS: Results for orders placed in visit on 03/10/14  COMPREHENSIVE METABOLIC PANEL      Result Value Ref Range   Sodium 137  135 - 145 mEq/L   Potassium 4.3  3.5 - 5.3 mEq/L   Chloride 103  96 - 112 mEq/L   CO2 25  19 - 32 mEq/L   Glucose, Bld 118 (*) 70 - 99 mg/dL   BUN 13  6 - 23 mg/dL   Creat 0.88  0.50 - 1.35 mg/dL   Total Bilirubin 0.4  0.2 - 1.2 mg/dL   Alkaline Phosphatase 51  39 - 117 U/L   AST 39 (*) 0 - 37 U/L   ALT 62 (*) 0 - 53 U/L   Total Protein 6.6  6.0 - 8.3 g/dL   Albumin 4.5  3.5 - 5.2 g/dL   Calcium 9.5  8.4 - 10.5 mg/dL  LIPID PANEL      Result Value Ref Range   Cholesterol 153  0 - 200 mg/dL   Triglycerides 114  <150 mg/dL   HDL 38 (*) >39 mg/dL   Total CHOL/HDL Ratio 4.0     VLDL 23  0 - 40 mg/dL   LDL Cholesterol 92  0 - 99 mg/dL  TSH      Result Value Ref Range   TSH 2.888  0.350 - 4.500 uIU/mL     EKG/XRAY:   Primary read interpreted by Dr. Marin Comment at [facility]. No acute cardiopulmonary process but please comment if that is a left upper solitary lung nodule or prominent vascualr marking   ASSESSMENT/PLAN: Encounter Diagnoses  Name Primary?  . Cough   . Acute left otitis media, recurrence not specified, unspecified otitis media type   . Lower respiratory infection Yes   Rx Augmentin For otitis and also Lower respiratory infection  Note for work given , will await for official xray , I see a ? Left upper lung  nodule  F/u in 1 month for repeat xray prn pending xray results   Gross sideeffects, risk and benefits, and alternatives of medications d/w patient. Patient is aware that all medications have potential sideeffects and we are unable to predict every sideeffect or drug-drug interaction that may occur.  Nicolai Labonte, [place], DO 04/15/2014 10:55 AM

## 2014-04-17 ENCOUNTER — Encounter: Payer: Self-pay | Admitting: Nurse Practitioner

## 2014-04-17 ENCOUNTER — Ambulatory Visit (INDEPENDENT_AMBULATORY_CARE_PROVIDER_SITE_OTHER): Payer: PRIVATE HEALTH INSURANCE | Admitting: Nurse Practitioner

## 2014-04-17 VITALS — BP 119/71 | HR 102 | Temp 98.6°F | Ht 69.0 in | Wt 248.0 lb

## 2014-04-17 DIAGNOSIS — I7090 Unspecified atherosclerosis: Secondary | ICD-10-CM | POA: Insufficient documentation

## 2014-04-17 DIAGNOSIS — E669 Obesity, unspecified: Secondary | ICD-10-CM

## 2014-04-17 DIAGNOSIS — E785 Hyperlipidemia, unspecified: Secondary | ICD-10-CM

## 2014-04-17 DIAGNOSIS — I7774 Dissection of vertebral artery: Secondary | ICD-10-CM

## 2014-04-17 DIAGNOSIS — G4733 Obstructive sleep apnea (adult) (pediatric): Secondary | ICD-10-CM

## 2014-04-17 DIAGNOSIS — Z9989 Dependence on other enabling machines and devices: Secondary | ICD-10-CM

## 2014-04-17 MED ORDER — CLOPIDOGREL BISULFATE 75 MG PO TABS: 75.0000 mg | ORAL_TABLET | Freq: Every day | ORAL | Status: DC

## 2014-04-17 NOTE — Patient Instructions (Signed)
Continue clopidogrel 75 mg orally every day for secondary stroke prevention and maintain strict control of hypertension with blood pressure goal below 130/90, diabetes with hemoglobin A1c goal below 7% and lipids with LDL cholesterol goal below 70 mg/dL.  Risk factor mgmt (diabetes, cholesterol) per PCP.  Follow up in 6 months, sooner as needed.  Stroke Prevention Some medical conditions and behaviors are associated with an increased chance of having a stroke. You may prevent a stroke by making healthy choices and managing medical conditions. HOW CAN I REDUCE MY RISK OF HAVING A STROKE?   Stay physically active. Get at least 30 minutes of activity on most or all days.  Do not smoke. It may also be helpful to avoid exposure to secondhand smoke.  Limit alcohol use. Moderate alcohol use is considered to be:  No more than 2 drinks per day for men.  No more than 1 drink per day for nonpregnant women.  Eat healthy foods. This involves:  Eating 5 or more servings of fruits and vegetables a day.  Making dietary changes that address high blood pressure (hypertension), high cholesterol, diabetes, or obesity.  Manage your cholesterol levels.  Making food choices that are high in fiber and low in saturated fat, trans fat, and cholesterol may control cholesterol levels.  Take any prescribed medicines to control cholesterol as directed by your health care provider.  Manage your diabetes.  Controlling your carbohydrate and sugar intake is recommended to manage diabetes.  Take any prescribed medicines to control diabetes as directed by your health care provider.  Control your hypertension.  Making food choices that are low in salt (sodium), saturated fat, trans fat, and cholesterol is recommended to manage hypertension.  Take any prescribed medicines to control hypertension as directed by your health care provider.  Maintain a healthy weight.  Reducing calorie intake and making food  choices that are low in sodium, saturated fat, trans fat, and cholesterol are recommended to manage weight.  Stop drug abuse.  Avoid taking birth control pills.  Talk to your health care provider about the risks of taking birth control pills if you are over 70 years old, smoke, get migraines, or have ever had a blood clot.  Get evaluated for sleep disorders (sleep apnea).  Talk to your health care provider about getting a sleep evaluation if you snore a lot or have excessive sleepiness.  Take medicines only as directed by your health care provider.  For some people, aspirin or blood thinners (anticoagulants) are helpful in reducing the risk of forming abnormal blood clots that can lead to stroke. If you have the irregular heart rhythm of atrial fibrillation, you should be on a blood thinner unless there is a good reason you cannot take them.  Understand all your medicine instructions.  Make sure that other conditions (such as anemia or atherosclerosis) are addressed. SEEK IMMEDIATE MEDICAL CARE IF:   You have sudden weakness or numbness of the face, arm, or leg, especially on one side of the body.  Your face or eyelid droops to one side.  You have sudden confusion.  You have trouble speaking (aphasia) or understanding.  You have sudden trouble seeing in one or both eyes.  You have sudden trouble walking.  You have dizziness.  You have a loss of balance or coordination.  You have a sudden, severe headache with no known cause.  You have new chest pain or an irregular heartbeat. Any of these symptoms may represent a serious problem that is  an emergency. Do not wait to see if the symptoms will go away. Get medical help at once. Call your local emergency services (911 in [place] Do not drive yourself to the hospital. Document Released: 10/16/2004 Document Revised: 01/23/2014 Document Reviewed: 03/11/2013 [facility] Patient Information 2015 [place], [place]. This information is not  intended to replace advice given to you by your health care provider. Make sure you discuss any questions you have with your health care provider.

## 2014-04-17 NOTE — Progress Notes (Signed)
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  4 years ago patient had episode of vertigo with nausea and vomiting. In the past 6 months he has had 3 more episodes. Initially he felt that his left ear was "stopped up". He then had severe spinning, nausea and vomiting sensation. Patient went to medical Dr. for evaluation initially. He then went to the hospital on 04/30/13 for another event. Patient had MRI of the brain which showed no acute stroke but raise the possibility of right vertebral artery occlusion. This is followed up with CT exam of the head and neck which showed thin caliber right vertebral artery, raising possibility for vertebral artery dissection versus atherosclerosis. Patient was on aspirin before this was discovered. He was then discharged on aspirin and Plavix. He saw my colleague Dr. Rexene Alberts on 05/17/13, who recommended continuing dual antiplatelet therapy. Patient is now referred to me for second opinion.  Since last office visit, patient has had one more event of spinning and nausea and vomiting. No double vision, slurred speech, trouble swallowing, syncope. He has some intermittent neck pain in the right posterior region. He continues to have some problems with anxiety and balance difficulty.   UPDATE 10/10/13 (LL): Kirk Oneal comes to the office for follow up for vertebral artery dissection and to review test results. His CT Angiogram of head and neck was stable, showing unchanged diminutive right vertebral artery since August. His 30-day cardiac monitor showed NSR. 2D Echo was normal. Lab work was normal except elevated HgbA1c of 8.8 which he is working on. He has had no further vertigo or other neurological symptoms since last office visit. He is doing well.   UPDATE  04/17/14 (LL): Since last visit patient has had no recurrent neurovascular symptoms, denies vertigo. His blood pressure is well controlled it is 119/71 in the office today. He had split-night sleep study which showed obstructive sleep apnea and is now on CPAP, seeing Dr. Rexene Alberts. He states his fasting blood sugars are between 110 and 120, and his last hemoglobin A1c was 8.4; he is now on 3 oral diabetic medications. His last LDL cholesterol was 92.  He is lost about 5 pounds in the last 6 months. He is tolerating Plavix well with no signs of significant bleeding or bruising. He has no complaints today.  REVIEW OF SYSTEMS: Full 14 system review of systems performed and notable only for (Nothing.)   ALLERGIES: No Known Allergies  HOME MEDICATIONS: Outpatient Prescriptions Prior to Visit  Medication Sig Dispense Refill  . amoxicillin-clavulanate (AUGMENTIN) 875-125 MG per tablet Take 1 tablet by mouth 2 (two) times daily.  20 tablet  0  . benzonatate (TESSALON) 100 MG capsule Take 2 capsules (200 mg total) by mouth 2 (two) times daily as needed.  30 capsule  1  . [facility] ASPIRIN CHILD 81 MG chewable tablet TAKE 1 TABLET BY MOUTH EVERY DAY  90 tablet  0  . glimepiride (AMARYL) 4 MG tablet Take 1 tablet (4 mg total) by mouth daily. Take with food. Monitor for low sugar  90 tablet  1  . glyBURIDE (DIABETA) 5 MG tablet Take 1 tablet by mouth daily.      Marland Kitchen HYDROcodone-homatropine (HYCODAN) 5-1.5 MG/5ML syrup Take 5 mLs by mouth at bedtime as needed.  120 mL  0  . ketoconazole (  NIZORAL) 2 % cream Apply 1 application topically daily as needed for irritation. 2 weeks at a time.  30 g  0  . ketoconazole (NIZORAL) 2 % shampoo Apply 1 application topically 2 (two) times a week.  120 mL  0  . meloxicam (MOBIC) 15 MG tablet Take 1 tablet by mouth daily.      . metFORMIN (GLUCOPHAGE) 1000 MG tablet Take 1,000 mg by mouth 2 (two) times daily with a meal.      . VIAGRA 100 MG tablet Take 1 tablet by mouth as needed.        Marland Kitchen ZETIA 10 MG tablet Take 1 tablet by mouth daily.      . clopidogrel (PLAVIX) 75 MG tablet Take 1 tablet (75 mg total) by mouth daily.  90 tablet  3   No facility-administered medications prior to visit.    PHYSICAL EXAM Filed Vitals:   04/17/14 1526  BP: 119/71  Pulse: 102  Temp: 98.6 F (37 C)  TempSrc: Oral  Height: 5\' 9"  (1.753 m)  Weight: 248 lb (112.492 kg)   Body mass index is 36.61 kg/(m^2).  Generalized: Well developed, in no acute distress, obese AA male  Head: normocephalic and atraumatic. Oropharynx benign  Neck: Supple, no carotid bruits  Cardiac: Regular rate rhythm, no murmur  Musculoskeletal: No deformity   Neurological examination  Mentation: Alert oriented to time, place, history taking. Follows all commands speech and language fluent  Cranial nerve II-XII: Pupils were equal round reactive to light extraocular movements were full, visual field were full on confrontational test. Facial sensation and strength were normal. hearing was intact to finger rubbing bilaterally. Uvula tongue midline. head turning and shoulder shrug and were normal and symmetric.Tongue protrusion into cheek strength was normal.  Motor: normal bulk and tone, full strength in the BUE, BLE, fine finger movements normal, no pronator drift. No focal weakness  Sensory: normal and symmetric to light touch, pinprick, and vibration  Coordination: finger-nose-finger, heel-to-shin bilaterally, no dysmetria  Reflexes: Deep tendon reflexes in the upper and lower extremities are present and symmetric.  Gait and Station: narrow based gait; able to walk on toes, heels and tandem; romberg is negative  DIAGNOSTIC DATA (LABS, IMAGING, TESTING) - I reviewed patient records, labs, notes, testing and imaging myself where available.  Lab Results  Component Value Date   WBC 8.4 03/18/2013   HGB 13.6 04/30/2013   HCT 40.0 04/30/2013   MCV 98.5* 03/18/2013      Component Value Date/Time   NA 137 03/10/2014  1116   K 4.3 03/10/2014 1116   CL 103 03/10/2014 1116   CO2 25 03/10/2014 1116   GLUCOSE 118* 03/10/2014 1116   BUN 13 03/10/2014 1116   BUN 12 08/30/2013 0913   CREATININE 0.88 03/10/2014 1116   CREATININE 0.87 08/30/2013 0913   CALCIUM 9.5 03/10/2014 1116   PROT 6.6 03/10/2014 1116   ALBUMIN 4.5 03/10/2014 1116   AST 39* 03/10/2014 1116   ALT 62* 03/10/2014 1116   ALKPHOS 51 03/10/2014 1116   BILITOT 0.4 03/10/2014 1116   GFRNONAA 86 01/20/2014 1110   GFRNONAA 93 08/30/2013 0913   GFRAA >89 01/20/2014 1110   GFRAA 108 08/30/2013 0913   Lab Results  Component Value Date   CHOL 153 03/10/2014   HDL 38* 03/10/2014   LDLCALC 92 03/10/2014   TRIG 114 03/10/2014   CHOLHDL 4.0 03/10/2014   Lab Results  Component Value Date   HGBA1C 8.4 01/20/2014  ASSESSMENT: 63 y.o. 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 This appears to be asymptomatic. Patient has good contralateral vertebral artery flow and flow through the basilar artery. Patient with no recurrent neurological symptoms.  Now being treated by Dr. Rexene Alberts for obstructive sleep apnea.  PLAN:  Continue clopidogrel 75 mg orally every day for secondary stroke prevention and maintain strict control of hypertension with blood pressure goal below 130/90, diabetes with hemoglobin A1c goal below 7% and lipids with LDL cholesterol goal below 70 mg/dL.  Risk factor mgmt (diabetes, cholesterol) per PCP.  Advised patient to get regular cardiovascular exercise at least 30 minutes per day for 5 days per week to reduce weight, blood sugar and cholesterol. Follow up in 6 months, sooner as needed.  Meds ordered this encounter  Medications  . clopidogrel (PLAVIX) 75 MG tablet    Sig: Take 1 tablet (75 mg total) by mouth daily.    Dispense:  90 tablet    Refill:  3    Order Specific Question:  Supervising Provider    Answer:  Penni Bombard [[id number]]   Return  in about 6 months (around 10/18/2014) for vertebral artery dissection.  Rudi Rummage LAM, MSN, FNP-BC, A/GNP-C 04/17/2014, 4:29 PM [facility] [address] [place], [place] [address] [phone]  Note: This document was prepared with digital dictation and possible smart phrase technology. Any transcriptional errors that result from this process are unintentional.

## 2014-04-18 NOTE — Telephone Encounter (Signed)
LM that chest xray is normal IMPRESSION:  1. No radiographic evidence of acute cardiopulmonary disease.  2. Atherosclerosis.

## 2014-04-24 NOTE — Progress Notes (Signed)
I reviewed note and agree with plan.   Kirk Bombard, MD 04/28/2823, 1:75 PM Certified in Neurology, Neurophysiology and Neuroimaging  [facility] [address] [place], [place] [address] [phone]

## 2014-05-05 ENCOUNTER — Ambulatory Visit (INDEPENDENT_AMBULATORY_CARE_PROVIDER_SITE_OTHER): Payer: 59 | Admitting: Family Medicine

## 2014-05-05 VITALS — BP 140/84 | HR 80 | Temp 97.9°F | Resp 16 | Ht 68.25 in | Wt 255.8 lb

## 2014-05-05 DIAGNOSIS — H579 Unspecified disorder of eye and adnexa: Secondary | ICD-10-CM

## 2014-05-05 DIAGNOSIS — H5789 Other specified disorders of eye and adnexa: Secondary | ICD-10-CM

## 2014-05-05 DIAGNOSIS — E119 Type 2 diabetes mellitus without complications: Secondary | ICD-10-CM

## 2014-05-05 DIAGNOSIS — R748 Abnormal levels of other serum enzymes: Secondary | ICD-10-CM

## 2014-05-05 LAB — POCT GLYCOSYLATED HEMOGLOBIN (HGB A1C): Hemoglobin A1C: 6.7

## 2014-05-05 MED ORDER — OFLOXACIN 0.3 % OP SOLN: 1.0000 [drp] | Freq: Four times a day (QID) | OPHTHALMIC | Status: DC

## 2014-05-05 NOTE — Progress Notes (Signed)
Chief Complaint:  Chief Complaint  Patient presents with  . Medication Refill  . Eye Pain    Right x 1 wk Pt thinks he has something in it    HPI: Kirk Oneal is a 63 y.o. male who is here for  Right eye irritation at the bottom right of his eye , he thinks some trash flew into his eye.  He is not having any blurred vision. He has more discomfort 2 days ago, no dc.  He wears contacts and hs not changed them out. He has nervousness, took his meds, then went walking He did not take it and knew , he has taken it and it was low and s He felt funny at 90. He feels funny at 100.  No numbness and tingling.    Past Medical History  Diagnosis Date  . Diabetes mellitus without complication   . Dissection of vertebral artery 05/17/2013   History reviewed. No pertinent past surgical history. History   Social History  . Marital Status: Married    Spouse Name: sharon    Number of Children: 2  . Years of Education: college   Occupational History  .  [employer]   Social History Main Topics  . Smoking status: Never Smoker   . Smokeless tobacco: Never Used  . Alcohol Use: Yes     Comment: occasional  . Drug Use: No  . Sexual Activity: None   Other Topics Concern  . None   Social History Narrative   Patient is right handed, resides with wife   Family History  Problem Relation Age of Onset  . Stomach cancer Mother   . Rectal cancer Father    No Known Allergies Prior to Admission medications   Medication Sig Start Date End Date Taking? Authorizing Provider  clopidogrel (PLAVIX) 75 MG tablet Take 1 tablet (75 mg total) by mouth daily. 04/17/14  Yes Philmore Pali, NP  [facility] ASPIRIN CHILD 81 MG chewable tablet TAKE 1 TABLET BY MOUTH EVERY DAY 05/25/13  Yes Star Age, MD  glimepiride (AMARYL) 4 MG tablet Take 1 tablet (4 mg total) by mouth daily. Take with food. Monitor for low sugar 03/10/14  Yes Andy Moye P Retta Pitcher, DO  ketoconazole (NIZORAL) 2 % cream Apply 1 application  topically daily as needed for irritation. 2 weeks at a time. 01/20/14  Yes Blia Totman P Rebakah Cokley, DO  ketoconazole (NIZORAL) 2 % shampoo Apply 1 application topically 2 (two) times a week. 01/20/14  Yes Owenn Rothermel P Kameryn Davern, DO  meloxicam (MOBIC) 15 MG tablet Take 1 tablet by mouth daily. 02/07/14  Yes Historical Provider, MD  metFORMIN (GLUCOPHAGE) 1000 MG tablet Take 1,000 mg by mouth 2 (two) times daily with a meal.   Yes Historical Provider, MD  VIAGRA 100 MG tablet Take 1 tablet by mouth as needed. 02/07/14  Yes Historical Provider, MD  ZETIA 10 MG tablet Take 1 tablet by mouth daily. 03/18/13  Yes Historical Provider, MD     ROS: The patient denies fevers, chills, night sweats, unintentional weight loss, chest pain, palpitations, wheezing, dyspnea on exertion, nausea, vomiting, abdominal pain, dysuria, hematuria, melena, numbness, weakness, or tingling.   All other systems have been reviewed and were otherwise negative with the exception of those mentioned in the HPI and as above.    PHYSICAL EXAM: Filed Vitals:   05/05/14 1457  BP: 140/84  Pulse: 80  Temp: 97.9 F (36.6 C)  Resp: 16   Filed  Vitals:   05/05/14 1457  Height: 5' 8.25" (1.734 m)  Weight: 255 lb 12.8 oz (116.03 kg)   Body mass index is 38.59 kg/(m^2).  General: Alert, no acute distress HEENT:  Normocephalic, atraumatic, oropharynx patent. EOMI, PERRLA, fluoroscein exam normal, no uptake, fundo exam normal Cardiovascular:  Regular rate and rhythm, no rubs murmurs or gallops.  No Carotid bruits, radial pulse intact. No pedal edema.  Respiratory: Clear to auscultation bilaterally.  No wheezes, rales, or rhonchi.  No cyanosis, no use of accessory musculature GI: No organomegaly, abdomen is soft and non-tender, positive bowel sounds.  No masses. Skin: No rashes. Neurologic: Facial musculature symmetric. Microfilament exam nl Psychiatric: Patient is appropriate throughout our interaction. Lymphatic: No cervical lymphadenopathy Musculoskeletal:  Gait intact.   LABS: Results for orders placed in visit on 05/05/14  POCT GLYCOSYLATED HEMOGLOBIN (HGB A1C)      Result Value Ref Range   Hemoglobin A1C 6.7       EKG/XRAY:   Primary read interpreted by Dr. Marin Comment at [facility]   ASSESSMENT/PLAN: Encounter Diagnoses  Name Primary?  . Eye irritation Yes  . Type II or unspecified type diabetes mellitus without mention of complication, not stated as uncontrolled   . Elevated liver enzymes    Continue with current meds for DM,  Avoid contact use at this time Rx Ocuflox 3 month f/u for DM, Monitor for Hypoglycemic sxs Recommend : ADA diet, BP goal <140/90, daily foot exams, tobacco cessation if smoking, annual eye exam, annual flu vaccine, PNA vaccine if age and time appropriate.    Gross sideeffects, risk and benefits, and alternatives of medications d/w patient. Patient is aware that all medications have potential sideeffects and we are unable to predict every sideeffect or drug-drug interaction that may occur.  Giulianna Rocha, [place], DO 05/05/2014 4:31 PM

## 2014-05-06 LAB — COMPLETE METABOLIC PANEL WITH GFR
ALT: 70 U/L — ABNORMAL HIGH (ref 0–53)
AST: 47 U/L — ABNORMAL HIGH (ref 0–37)
Albumin: 4.3 g/dL (ref 3.5–5.2)
BUN: 10 mg/dL (ref 6–23)
Calcium: 9.6 mg/dL (ref 8.4–10.5)
Chloride: 103 mEq/L (ref 96–112)
Potassium: 4.2 mEq/L (ref 3.5–5.3)
Sodium: 139 mEq/L (ref 135–145)
Total Bilirubin: 0.4 mg/dL (ref 0.2–1.2)
Total Protein: 6.8 g/dL (ref 6.0–8.3)

## 2014-05-06 LAB — COMPLETE METABOLIC PANEL WITHOUT GFR
Alkaline Phosphatase: 51 U/L (ref 39–117)
CO2: 25 meq/L (ref 19–32)
Creat: 0.96 mg/dL (ref 0.50–1.35)
GFR, Est African American: >89 mL/min
GFR, Est Non African American: 84 mL/min
Glucose, Bld: 99 mg/dL (ref 70–99)

## 2014-05-18 ENCOUNTER — Encounter: Payer: Self-pay | Admitting: Family Medicine

## 2014-08-01 ENCOUNTER — Telehealth: Payer: Self-pay | Admitting: *Deleted

## 2014-08-01 NOTE — Telephone Encounter (Signed)
Call pt to resched apt. Lft VM with new apt date and time. Requested he call in if the apt does not fit her schedule.

## 2014-08-06 ENCOUNTER — Ambulatory Visit (INDEPENDENT_AMBULATORY_CARE_PROVIDER_SITE_OTHER): Payer: No Typology Code available for payment source | Admitting: Family Medicine

## 2014-08-06 VITALS — BP 126/78 | HR 91 | Temp 97.9°F | Resp 16 | Ht 69.0 in | Wt 251.0 lb

## 2014-08-06 DIAGNOSIS — R238 Other skin changes: Secondary | ICD-10-CM

## 2014-08-06 DIAGNOSIS — R7401 Elevation of levels of liver transaminase levels: Secondary | ICD-10-CM

## 2014-08-06 DIAGNOSIS — E119 Type 2 diabetes mellitus without complications: Secondary | ICD-10-CM

## 2014-08-06 DIAGNOSIS — B370 Candidal stomatitis: Secondary | ICD-10-CM

## 2014-08-06 DIAGNOSIS — R740 Nonspecific elevation of levels of transaminase and lactic acid dehydrogenase [LDH]: Secondary | ICD-10-CM

## 2014-08-06 DIAGNOSIS — B009 Herpesviral infection, unspecified: Secondary | ICD-10-CM

## 2014-08-06 LAB — POCT CBC
Granulocyte percent: 56 %G (ref 37–80)
HEMATOCRIT: 43.6 % (ref 43.5–53.7)
HEMOGLOBIN: 14.1 g/dL (ref 14.1–18.1)
LYMPH, POC: 2.9 (ref 0.6–3.4)
MCH: 30.6 pg (ref 27–31.2)
MCHC: 32.3 g/dL (ref 31.8–35.4)
MCV: 94.7 fL (ref 80–97)
MID (cbc): 0.4 (ref 0–0.9)
MPV: 6.6 fL (ref 0–99.8)
POC Granulocyte: 4.2 (ref 2–6.9)
POC LYMPH %: 38.3 % (ref 10–50)
POC MID %: 5.7 %M (ref 0–12)
Platelet Count, POC: 382 10*3/uL (ref 142–424)
RBC: 4.6 M/uL — AB (ref 4.69–6.13)
RDW, POC: 14.1 %
WBC: 7.5 10*3/uL (ref 4.6–10.2)

## 2014-08-06 LAB — POCT SEDIMENTATION RATE: POCT SED RATE: 74 mm/hr — AB (ref 0–22)

## 2014-08-06 LAB — COMPREHENSIVE METABOLIC PANEL
ALT: 38 U/L (ref 0–53)
AST: 29 U/L (ref 0–37)
Albumin: 4.2 g/dL (ref 3.5–5.2)
Alkaline Phosphatase: 59 U/L (ref 39–117)
BUN: 11 mg/dL (ref 6–23)
CALCIUM: 9.5 mg/dL (ref 8.4–10.5)
CO2: 28 meq/L (ref 19–32)
Chloride: 101 mEq/L (ref 96–112)
Creat: 0.97 mg/dL (ref 0.50–1.35)
GLUCOSE: 168 mg/dL — AB (ref 70–99)
POTASSIUM: 4.5 meq/L (ref 3.5–5.3)
Sodium: 138 mEq/L (ref 135–145)
TOTAL PROTEIN: 7.1 g/dL (ref 6.0–8.3)
Total Bilirubin: 0.3 mg/dL (ref 0.2–1.2)

## 2014-08-06 LAB — HEPATITIS B SURFACE ANTIBODY,QUALITATIVE: Hep B S Ab: NEGATIVE

## 2014-08-06 LAB — GLUCOSE, POCT (MANUAL RESULT ENTRY): POC Glucose: 162 mg/dl — AB (ref 70–99)

## 2014-08-06 LAB — POCT GLYCOSYLATED HEMOGLOBIN (HGB A1C): Hemoglobin A1C: 7.8

## 2014-08-06 LAB — HIV ANTIBODY (ROUTINE TESTING W REFLEX): HIV 1&2 Ab, 4th Generation: NONREACTIVE

## 2014-08-06 LAB — HEPATITIS C ANTIBODY: HCV Ab: NEGATIVE

## 2014-08-06 LAB — HEPATITIS B CORE ANTIBODY, TOTAL: Hep B Core Total Ab: NONREACTIVE

## 2014-08-06 LAB — HEPATITIS A ANTIBODY, TOTAL: Hep A Total Ab: NONREACTIVE

## 2014-08-06 MED ORDER — VALACYCLOVIR HCL 500 MG PO TABS: ORAL_TABLET | ORAL | Status: DC

## 2014-08-06 MED ORDER — NYSTATIN 100000 UNIT/ML MT SUSP: 10.0000 mL | Freq: Four times a day (QID) | OROMUCOSAL | Status: DC

## 2014-08-06 MED ORDER — GLIMEPIRIDE 4 MG PO TABS: 8.0000 mg | ORAL_TABLET | Freq: Every day | ORAL | Status: DC

## 2014-08-06 NOTE — Progress Notes (Addendum)
Subjective:    Patient ID: Kirk Oneal, male    DOB: [birth year], 63 y.o.   MRN: [id number] This chart was scribed for [name], MD by [place]ique Peace, ED Scribe. The patient was seen in [facility]. The patient's care was started at 12:07 PM.  Chief Complaint  Patient presents with  . Blister    mouth/nose  . Diabetes    a 1 c check     HPI HPI Comments: Kirk Oneal is a 63 y.o. male who presents to the Emergency Department seeking follow up. Pt states he was going to get sugars checked again today. Pt reports he usually checks sugars around 1:00 PM daily, before he eats and goes to bed. He states that he usually gets somewhere between 100-140. He denies low sugar levels recently. Pt denies having ultrasound performed on his liver. Pt is on Zetia currently and reports no problems with medication.   Pt is currently complaining of fever blisters onset 2 days ago after having cold. He states he hasn't been able to eat and drink normally due to pain. He notes that he hasn't been able to get as much sleep as usual. Pt denies any new stress, changes in weight, new sexual partners, or changes in sugar. He further denies any genital lesions or use of anti-virals. He reports history of cold sores and fever blisters in the past. He also states he usually gets sick around this time every year. Pt adds that he hasn't been able to go to work since the 11th.    Pt has Type 2 DM. Last A1C was 3 months prior at 6.7. Pt has had transaminitis with no abdominal imagining. Lipid panel is under excellent control. Pt has had recent significant improvement in DM as A1C was 8.4, 6 months ago. Pt has history of severe vertigo symptoms which were concerning for vertebral artery dissection. Now controlled on anti-platelet treatment and regularly following up with radiology. Pt is receiving some of his medical care from the [place].   Past Medical History  Diagnosis Date  . Diabetes mellitus without complication   .  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 clopidogrel (PLAVIX) 75 MG tablet Take 1 tablet (75 mg total) by mouth daily. 90 tablet 3  . [facility] ASPIRIN CHILD 81 MG chewable tablet TAKE 1 TABLET BY MOUTH EVERY DAY 90 tablet 0  . glimepiride (AMARYL) 4 MG tablet Take 1 tablet (4 mg total) by mouth daily. Take with food. Monitor for low sugar 90 tablet 1  . ketoconazole (NIZORAL) 2 % cream Apply 1 application topically daily as needed for irritation. 2 weeks at a time. 30 g 0  . ketoconazole (NIZORAL) 2 % shampoo Apply 1 application topically 2 (two) times a week. 120 mL 0  . meloxicam (MOBIC) 15 MG tablet Take 1 tablet by mouth daily.    . metFORMIN (GLUCOPHAGE) 1000 MG tablet Take 1,000 mg by mouth 2 (two) times daily with a meal.    . ofloxacin (OCUFLOX) 0.3 % ophthalmic solution Place 1 drop into the right eye 4 (four) times daily. 5 mL 0  . VIAGRA 100 MG tablet Take 1 tablet by mouth as needed.    Marland Kitchen ZETIA 10 MG tablet Take 1 tablet by mouth daily.     No current facility-administered medications on file prior to visit.   No Known Allergies    Review of  Systems  Constitutional: Negative for activity change and unexpected weight change.  HENT: Positive for mouth sores.        Fever blisters.   Psychiatric/Behavioral:       Trouble sleeping recently.         Objective:  BP 126/78 mmHg  Pulse 91  Temp(Src) 97.9 F (36.6 C) (Oral)  Resp 16  Ht 5\' 9"  (1.753 m)  Wt 251 lb (113.853 kg)  BMI 37.05 kg/m2  SpO2 98%  Physical Exam  Constitutional: He is oriented to person, place, and time. He appears well-developed and well-nourished. No distress.  HENT:  Head: Normocephalic and atraumatic.  Right Ear: Tympanic membrane is injected.  Left Ear: Tympanic membrane is injected.  Nose: Mucosal edema and rhinorrhea present.  White coating on tongue that does not remove with scrubbing. Pin point  vesicles covering below nose and extending over left upper lip and along left upper labial mucosa. No significant lesions seen inside of mouth.   Eyes: Conjunctivae and EOM are normal.  Neck: Neck supple. No tracheal deviation present.  Cardiovascular: Normal rate, regular rhythm and normal heart sounds.  Exam reveals no gallop and no friction rub.   No murmur heard. Pulmonary/Chest: Effort normal. No respiratory distress.  Musculoskeletal: Normal range of motion.  Lymphadenopathy:       Head (right side): Submandibular and tonsillar adenopathy present.       Head (left side): Submandibular and tonsillar adenopathy present.    He has cervical adenopathy.       Right cervical: No superficial cervical adenopathy present.      Left cervical: Superficial cervical adenopathy present.       Right: No supraclavicular adenopathy present.       Left: No supraclavicular adenopathy present.  Neurological: He is alert and oriented to person, place, and time.  Skin: Skin is warm and dry.  Psychiatric: He has a normal mood and affect. His behavior is normal.  Nursing note and vitals reviewed.     Results for orders placed or performed in visit on 08/06/14  POCT CBC  Result Value Ref Range   WBC 7.5 4.6 - 10.2 K/uL   Lymph, poc 2.9 0.6 - 3.4   POC LYMPH PERCENT 38.3 10 - 50 %L   MID (cbc) 0.4 0 - 0.9   POC MID % 5.7 0 - 12 %M   POC Granulocyte 4.2 2 - 6.9   Granulocyte percent 56.0 37 - 80 %G   RBC 4.60 (A) 4.69 - 6.13 M/uL   Hemoglobin 14.1 14.1 - 18.1 g/dL   HCT, POC 43.6 43.5 - 53.7 %   MCV 94.7 80 - 97 fL   MCH, POC 30.6 27 - 31.2 pg   MCHC 32.3 31.8 - 35.4 g/dL   RDW, POC 14.1 %   Platelet Count, POC 382 142 - 424 K/uL   MPV 6.6 0 - 99.8 fL  POCT glucose (manual entry)  Result Value Ref Range   POC Glucose 162 (A) 70 - 99 mg/dl  POCT glycosylated hemoglobin (Hb A1C)  Result Value Ref Range   Hemoglobin A1C 7.8     Assessment & Plan:   Blisters of multiple sites - Plan: POCT  CBC, POCT glucose (manual entry), POCT SEDIMENTATION RATE, Comprehensive metabolic panel, HIV antibody, CANCELED: Hepatic function panel - suspect cold sore so start valtrex, RTC for further eval if ulcers recur.  Type 2 diabetes mellitus without complication - Plan: POCT glycosylated hemoglobin (Hb A1C) - reviewed low  carb diet. Increase amaryl from 4 to 8 qam cc. Cont metformin. Recheck in 3 mos.  Transaminitis - Plan: Hepatitis A antibody, total, Hepatitis B core antibody, total, Hepatitis B e antibody, Hepatitis B surface antibody, Hepatitis C antibody - reviewed low fat diet, no EtOH, no otc tylenol. Recheck LFTs and rec RUQ [place] if continue to be elev - suspect fatty liver.  Recurrent HSV (herpes simplex virus)  Thrush  Meds ordered this encounter  Medications  . valACYclovir (VALTREX) 500 MG tablet    Sig: Take 4 tabs po every 12 hours x 2 doses, then 1 tab daily until gone.    Dispense:  38 tablet    Refill:  0  . nystatin (MYCOSTATIN) 100000 UNIT/ML suspension    Sig: Use as directed 10 mLs (1,000,000 Units total) in the mouth or throat 4 (four) times daily. Gargle, switch and spit    Dispense:  240 mL    Refill:  0    Mix 1:1 with viscous lidocaine  . glimepiride (AMARYL) 4 MG tablet    Sig: Take 2 tablets (8 mg total) by mouth daily with breakfast. Take with food. Monitor for low sugar    Dispense:  180 tablet    Refill:  0    I personally performed the services described in this documentation, which was scribed in my presence. The recorded information has been reviewed and considered, and addended by me as needed.  [name], MD MPH

## 2014-08-06 NOTE — Patient Instructions (Signed)
Make sure you are following a low fat, low carbohydrate diet.  Do not use much otc tylenol/acetaminophen and do not drink alcohol.  Increase your amaryl to 2 tabs every monring and continue on your metformin.  If your liver is still irritated, we will send you for an ultrasound of your liver.  Recheck in 3 months but sooner if your lip/mouth pain/lesions/rash come back. Herpes Labialis You have a fever blister or cold sore (herpes labialis). These painful, grouped sores are caused by one of the herpes viruses (HSV1 most commonly). They are usually found around the lips and mouth, but the same infection can also affect other areas on the face such as the nose and eyes. Herpes infections take about 10 days to heal. They often occur again and again in the same spot. Other symptoms may include numbness and tingling in the involved skin, achiness, fever, and swollen glands in the neck. Colds, emotional stress, injuries, or excess sunlight exposure all seem to make herpes reappear. Herpes lip infections are contagious. Direct contact with these sores can spread the infection. It can also be spread to other parts of your own body. TREATMENT  Herpes labialis is usually self-limited and resolves within 1 week. To reduce pain and swelling, apply ice packs frequently to the sores or suck on popsicles or frozen juice bars. Antiviral medicine may be used by mouth to shorten the duration of the breakout. Avoid spreading the infection by washing your hands often. Be careful not to touch your eyes or genital areas after handling the infected blisters. Do not kiss or have other intimate contact with others. After the blisters are completely healed you may resume contact. Use sunscreen to lessen recurrences.  If this is your first infection with herpes, or if you have a severe or repeated infections, your caregiver may prescribe one of the anti-viral drugs to speed up the healing. If you have sun-related flare-ups despite the  use of sunscreen, starting oral anti-viral medicine before a prolonged exposure (going skiing or to the beach) can prevent most episodes.  SEEK IMMEDIATE MEDICAL CARE IF:  You develop a headache, sleepiness, high fever, vomiting, or severe weakness.  You have eye irritation, pain, blurred vision or redness.  You develop a prolonged infection not getting better in 10 days. Document Released: 09/08/2005 Document Revised: 12/01/2011 Document Reviewed: 07/13/2009 [facility] Patient Information 2015 [place], [place]. This information is not intended to replace advice given to you by your health care provider. Make sure you discuss any questions you have with your health care provider. Thrush, Adult  Ritta Slot, also called oral candidiasis, is a fungal infection that develops in the mouth and throat and on the tongue. It causes white patches to form on the mouth and tongue. Ritta Slot is most common in older adults, but it can occur at any age.  Many cases of thrush are mild, but this infection can also be more serious. Ritta Slot can be a recurring problem for people who have chronic illnesses or who take medicines that limit the body's ability to fight infection. Because these people have difficulty fighting infections, the fungus that causes thrush can spread throughout the body. This can cause life-threatening blood or organ infections. CAUSES  Ritta Slot is usually caused by a yeast called Candida albicans. This fungus is normally present in small amounts in the mouth and on other mucous membranes. It usually causes no harm. However, when conditions are present that allow the fungus to grow uncontrolled, it invades surrounding tissues and  becomes an infection. Less often, other Candida species can also lead to thrush.  RISK FACTORS Ritta Slot is more likely to develop in the following people:  People with an impaired ability to fight infection (weakened immune system).   Older adults.   People with HIV.   People  with diabetes.   People with dry mouth (xerostomia).   Pregnant women.   People with poor dental care, especially those who have false teeth.   People who use antibiotic medicines.  SIGNS AND SYMPTOMS  Ritta Slot can be a mild infection that causes no symptoms. If symptoms develop, they may include:   A burning feeling in the mouth and throat. This can occur at the start of a thrush infection.   White patches that adhere to the mouth and tongue. The tissue around the patches may be red, raw, and painful. If rubbed (during tooth brushing, for example), the patches and the tissue of the mouth may bleed easily.   A bad taste in the mouth or difficulty tasting foods.   Cottony feeling in the mouth.   Pain during eating and swallowing. DIAGNOSIS  Your health care provider can usually diagnose thrush by looking in your mouth and asking you questions about your health.  TREATMENT  Medicines that help prevent the growth of fungi (antifungals) are the standard treatment for thrush. These medicines are either applied directly to the affected area (topical) or swallowed (oral). The treatment will depend on the severity of the condition.  Mild Thrush Mild cases of thrush may clear up with the use of an antifungal mouth rinse or lozenges. Treatment usually lasts about 14 days.  Moderate to Severe Thrush  More severe thrush infections that have spread to the esophagus are treated with an oral antifungal medicine. A topical antifungal medicine may also be used.   For some severe infections, a treatment period longer than 14 days may be needed.   Oral antifungal medicines are almost never used during pregnancy because the fetus may be harmed. However, if a pregnant woman has a rare, severe thrush infection that has spread to her blood, oral antifungal medicines may be used. In this case, the risk of harm to the mother and fetus from the severe thrush infection may be greater than the risk  posed by the use of antifungal medicines.  Persistent or Recurrent Thrush For cases of thrush that do not go away or keep coming back, treatment may involve the following:   Treatment may be needed twice as long as the symptoms last.   Treatment will include both oral and topical antifungal medicines.   People with weakened immune systems can take an antifungal medicine on a continuous basis to prevent thrush infections.  It is important to treat conditions that make you more likely to get thrush, such as diabetes or HIV.  HOME CARE INSTRUCTIONS   Only take over-the-counter or prescription medicine as directed by your health care provider. Talk to your health care provider about an over-the-counter medicine called gentian violet, which kills bacteria and fungi.   Eat plain, unflavored yogurt as directed by your health care provider. Check the label to make sure the yogurt contains live cultures. This yogurt can help healthy bacteria grow in the mouth that can stop the growth of the fungus that causes thrush.   Try these measures to help reduce the discomfort of thrush:   Drink cold liquids such as water or iced tea.   Try flavored ice treats or frozen juices.  Eat foods that are easy to swallow, such as gelatin, ice cream, or custard.   If the patches in your mouth are painful, try drinking from a straw.   Rinse your mouth several times a day with a warm saltwater rinse. You can make the saltwater mixture with 1 tsp (6 g) of salt in 8 fl oz (0.2 L) of warm water.   If you wear dentures, remove the dentures before going to bed, brush them vigorously, and soak them in a cleaning solution as directed by your health care provider.   Women who are breastfeeding should clean their nipples with an antifungal medicine as directed by their health care provider. Dry the nipples after breastfeeding. Applying lanolin-containing body lotion may help relieve nipple soreness.  SEEK  MEDICAL CARE IF:  Your symptoms are getting worse or are not improving within 7 days of starting treatment.   You have symptoms of spreading infection, such as white patches on the skin outside of the mouth.   You are nursing and you have redness, burning, or pain in the nipples that is not relieved with treatment.  MAKE SURE YOU:  Understand these instructions.  Will watch your condition.  Will get help right away if you are not doing well or get worse. Document Released: 06/03/2004 Document Revised: 06/29/2013 Document Reviewed: 04/11/2013 [facility] Patient Information 2015 [place], [place]. This information is not intended to replace advice given to you by your health care provider. Make sure you discuss any questions you have with your health care provider.  Diabetes Mellitus and Food It is important for you to manage your blood sugar (glucose) level. Your blood glucose level can be greatly affected by what you eat. Eating healthier foods in the appropriate amounts throughout the day at about the same time each day will help you control your blood glucose level. It can also help slow or prevent worsening of your diabetes mellitus. Healthy eating may even help you improve the level of your blood pressure and reach or maintain a healthy weight.  HOW CAN FOOD AFFECT ME? Carbohydrates Carbohydrates affect your blood glucose level more than any other type of food. Your dietitian will help you determine how many carbohydrates to eat at each meal and teach you how to count carbohydrates. Counting carbohydrates is important to keep your blood glucose at a healthy level, especially if you are using insulin or taking certain medicines for diabetes mellitus. Alcohol Alcohol can cause sudden decreases in blood glucose (hypoglycemia), especially if you use insulin or take certain medicines for diabetes mellitus. Hypoglycemia can be a life-threatening condition. Symptoms of hypoglycemia (sleepiness,  dizziness, and disorientation) are similar to symptoms of having too much alcohol.  If your health care provider has given you approval to drink alcohol, do so in moderation and use the following guidelines:  Women should not have more than one drink per day, and men should not have more than two drinks per day. One drink is equal to:  12 oz of beer.  5 oz of wine.  1 oz of hard liquor.  Do not drink on an empty stomach.  Keep yourself hydrated. Have water, diet soda, or unsweetened iced tea.  Regular soda, juice, and other mixers might contain a lot of carbohydrates and should be counted. WHAT FOODS ARE NOT RECOMMENDED? As you make food choices, it is important to remember that all foods are not the same. Some foods have fewer nutrients per serving than other foods, even though they might  have the same number of calories or carbohydrates. It is difficult to get your body what it needs when you eat foods with fewer nutrients. Examples of foods that you should avoid that are high in calories and carbohydrates but low in nutrients include:  Trans fats (most processed foods list trans fats on the Nutrition Facts label).  Regular soda.  Juice.  Candy.  Sweets, such as cake, pie, doughnuts, and cookies.  Fried foods. WHAT FOODS CAN I EAT? Have nutrient-rich foods, which will nourish your body and keep you healthy. The food you should eat also will depend on several factors, including:  The calories you need.  The medicines you take.  Your weight.  Your blood glucose level.  Your blood pressure level.  Your cholesterol level. You also should eat a variety of foods, including:  Protein, such as meat, poultry, fish, tofu, nuts, and seeds (lean animal proteins are best).  Fruits.  Vegetables.  Dairy products, such as milk, cheese, and yogurt (low fat is best).  Breads, grains, pasta, cereal, rice, and beans.  Fats such as olive oil, trans fat-free margarine, canola  oil, avocado, and olives. DOES EVERYONE WITH DIABETES MELLITUS HAVE THE SAME MEAL PLAN? Because every person with diabetes mellitus is different, there is not one meal plan that works for everyone. It is very important that you meet with a dietitian who will help you create a meal plan that is just right for you. Document Released: 06/05/2005 Document Revised: 09/13/2013 Document Reviewed: 08/05/2013 [facility] Patient Information 2015 [place], [place]. This information is not intended to replace advice given to you by your health care provider. Make sure you discuss any questions you have with your health care provider.  Liver Profile A liver profile is a battery of tests which helps your caregiver evaluate your liver function. The following tests are often included in the liver profile: Alanine aminotransferase (ALT or SGPT) This is an enzyme found primarily in the liver. Abnormalities may represent liver disease. This is found in cells of the liver so when it is elevated, it has been released by damaged cells. Albumin - The serum albumin is one of the major proteins in the blood and a reflection of the general state of nutrition. This is low when the liver is unable to do its job. It is also low when protein is lost in the urine. NORMAL FINDINGS Adult/Elderly  Total protein: 6.4-8.3 g/dL or 64-83 g/L (SI units)  Albumin: 3.5-5 g/dL or 35-50 g/L (SI units)  Globulin: 2.3-3.4 g/dL  Alpha1 globulin: 0.1-0.3 g/dL or 1-3 g/L (SI units)  Alpha2 globulin: 0.6-1 g/dL or 6-10 g/L (SI units)  Beta globulin: 0.7-1.1 g/dL or 7-11 g/L (SI units) Children  Total protein  Premature infant: 4.2-7.6 g/dL  Newborn: 4.6-7.4 g/dL  Infant: 6-6.7 g/dL  Child: 6.2-8 g/dL  Albumin  Premature infant: 3-4.2 g/dL  Newborn: 3.5-5.4 g/dL  Infant: 4.4-5.4 g/dL  Child: 4-5.9 g/dL Albumin/Globulin ratio - Calculated by dividing the albumin by the globulin. It is a measure of well being.  Alkaline  phosphatase - This is an enzyme which is important in diagnosing proper bone and liver functions. NORMAL FINDINGS Age / Normal Value (units/L)  0-5 days / 35-140  Less than 3 yr / 15-60  3-6 yr / 15-50  6-12 yr / 10-50  12-18 yr / 10-40  Adult / 0-35 units/L or 0-0.58 microKat/L (SI Units) (Females tend to have slightly lower levels than males)  Elderly / Slightly higher than  adults Aspartate aminotransferase (AST or SGOT) - an enzyme found in skeletal and heart muscle, liver and other organs. Abnormalities may represent liver disease. This is found in cells of the liver so when it is elevated, it has been released by damaged cells. Bilirubin, Total: A chemical involved with liver functions. High concentrations may result in jaundice. Jaundice is a yellowing of the skin and the whites of the eyes. NORMAL FINDINGS Blood  Adult/elderly/child  Total bilirubin: 0.3-1.0 mg/dL or 5.1-17 micromole/L (SI units)  Indirect bilirubin: 0.2-0.8 mg/dL or 3.4-12.0 micromole/L (SI units)  Direct bilirubin: 0.1-0.3 mg/dL or 1.7-5.1 micromole/L (SI units)  Newborn total bilirubin: 1.0-12.0 mg/dL or 17.1-205 micromole/L (SI units)  Urine0-0.02 mg/dL Ranges for normal findings may vary among different laboratories and hospitals. You should always check with your doctor after having lab work or other tests done to discuss the meaning of your test results and whether your values are considered within normal limits PREPARATION FOR TEST No preparation or fasting is necessary unless you have been informed otherwise. A blood sample is obtained by inserting a needle into a vein in the arm. MEANING OF TEST  Your caregiver will go over the test results with you and discuss the importance and meaning of your results, as well as treatment options and the need for additional tests if necessary. OBTAINING THE TEST RESULTS It is your responsibility to obtain your test results. Ask the lab or department  performing the test when and how you will get your results. Document Released: 10/11/2004 Document Revised: 12/01/2011 Document Reviewed: 01/10/2014 [facility] Patient Information 2015 [place], [place]. This information is not intended to replace advice given to you by your health care provider. Make sure you discuss any questions you have with your health care provider.

## 2014-08-07 ENCOUNTER — Encounter: Payer: Self-pay | Admitting: Family Medicine

## 2014-08-09 LAB — HEPATITIS B E ANTIBODY: HEPATITIS BE ANTIBODY: NONREACTIVE

## 2014-08-20 ENCOUNTER — Other Ambulatory Visit: Payer: Self-pay | Admitting: Family Medicine

## 2014-08-28 ENCOUNTER — Ambulatory Visit: Payer: PRIVATE HEALTH INSURANCE | Admitting: Neurology

## 2014-08-29 ENCOUNTER — Encounter: Payer: Self-pay | Admitting: Neurology

## 2014-08-30 ENCOUNTER — Ambulatory Visit (INDEPENDENT_AMBULATORY_CARE_PROVIDER_SITE_OTHER): Payer: 59 | Admitting: Neurology

## 2014-08-30 ENCOUNTER — Encounter: Payer: Self-pay | Admitting: Neurology

## 2014-08-30 VITALS — BP 123/80 | HR 75 | Temp 97.7°F | Ht 69.0 in | Wt 257.0 lb

## 2014-08-30 DIAGNOSIS — I7774 Dissection of vertebral artery: Secondary | ICD-10-CM

## 2014-08-30 DIAGNOSIS — G4733 Obstructive sleep apnea (adult) (pediatric): Secondary | ICD-10-CM

## 2014-08-30 DIAGNOSIS — Z9989 Dependence on other enabling machines and devices: Principal | ICD-10-CM

## 2014-08-30 NOTE — Patient Instructions (Addendum)
Please continue using your CPAP regularly. While your insurance requires that you use CPAP at least 4 hours each night on 70% of the nights, I recommend, that you not skip any nights and use it throughout the night if you can. Getting used to CPAP and staying with the treatment long term does take time and patience and discipline. Untreated obstructive sleep apnea when it is moderate to severe can have an adverse impact on cardiovascular health and raise her risk for heart disease, arrhythmias, hypertension, congestive heart failure, stroke and diabetes. Untreated obstructive sleep apnea causes sleep disruption, nonrestorative sleep, and sleep deprivation. This can have an impact on your day to day functioning and cause daytime sleepiness and impairment of cognitive function, memory loss, mood disturbance, and problems focussing. Using CPAP regularly can improve these symptoms.  Please make sure you rinse your CPAP mask every morning. Pls call your DME company, [employer], for advise and to get supplies regularly.

## 2014-08-30 NOTE — Progress Notes (Signed)
Subjective:    Patient ID: Kirk Oneal is a 63 y.o. male.  HPI     Interim history:  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 He is unaccompanied today. I last saw him on 03/23/2014, at which time he reported that he had difficulty adjusting to CPAP because of his PTSD. He was compliant after an initial adjustment time. He still had some anxiety issues with the mask. He was able to tolerate the nasal mask fairly well. He did endorse feeling better with his sleep since being on treatment. He denied any vertigo or dizziness.  Today, I reviewed his compliance data from 07/29/2014 through 08/27/2014 which is a total of 30 days during which time he used his CPAP only 3 nights with percent used days greater than 4 hours of 0%, indicating noncompliance. Average usage for all days was 11 minutes, he is on AutoPap. Residual AHI 1.5 per hour leak low.  Today he reports that he had a respiratory infection in July, for which she saw Dr. Marin Comment and was treated with Augmentin. This cleared up but last month he developed fever blisters and was seen by his primary care physician for this. He was not sure if his respiratory infection or fever blisters were due to CPAP. I explained to him that CPAP does not cause respiratory infection or fever blisters. He had not been rinsing his mask every day. He did not call in for any questions or advised in the interim. He has received recent supplies from his DME provider, [employer]. He does admit that with CPAP he has been sleeping better but also admits that he had not been using his machine recent for fear of causing other medical issues. Today I spent a long time explaining his sleep study findings and his compliance data again to him and explained to him that CPAP therapy is part of  his vascular disease prevention treatment. It helps him sleep better, ensures better oxygen saturation and as long as he follows instructions as to how to clean his mask properly and when to change his filters and his supplies he should not have any additional or new medical problems. His diabetes control is recently better. About a year ago his A1c was 8.8 and he was able to bring it down to as low as 6.8 recently.   I first met him on 05/17/2013 at which time he reported a recent emergency room visit on 04/29/2013 with a six-week history of dizziness. At the time of his first visit with me I suggested an evaluation with my colleague Dr. Leta Baptist and he saw him on 06/03/2013 with further imaging studies requested. I ordered a sleep study, as he reported snoring, and nonrestorative sleep. He had a split-night sleep study on 01/06/2014 and I went over his test results with him in detail today. His baseline sleep efficiency was reduced at 80% with a latency to sleep of 1.5 minutes and wake after sleep onset of 5.5 minutes with mild sleep fragmentation noted. He had an increased percentage of stage II sleep, absence of slow-wave sleep and 21.3% of REM sleep with a reduced REM latency of 3.5 minutes. He had no significant periodic leg movements or EKG changes. Mild to moderate snoring was noted. His total AHI was 33 per hour. Baseline oxygen saturation was 90%, nadir was 75% in  REM sleep. He was then titrated on CPAP. He had a very low sleep efficiency. He had 2 very long periods of wakefulness. He had an increased percentage of light stage sleep, absence of slow-wave sleep, and 17.3% of REM sleep. Average oxygen saturation was 93%, nadir was 83%. Time below 88% saturation was 43 seconds. Snoring was eliminated. He was started on CPAP of 5 cm and titrated to 7 cm. He did have a resolution of his sleep disordered breathing but very poor sleep consolidation and I felt this was not an adequate CPAP titration. I  therefore placed the patient on autoPAP.  I reviewed the patient's CPAP compliance data from 02/21/14 to 03/22/14, which is a total of 30 days, during which time the patient used CPAP on 21 nights, but consistently in the last 21 days. The average usage for all days was only 1 hour and 59 minutes but for the last couple of weeks when he used it consistently the average usage was 2 hours and 50 minutes. His AutoPap is set between 7 and 12 with CPR of 2 and 95th percentile pressure is 10.4 cm leak is low at 7.1 L per minute at the 95th percentile and AHI low at 1.4 per hour indicating adequate settings at this time.    He went to [facility] in July 2014, and was told he had a UTI. He went back and was given an Rx for meclizine, which did help in the beginning, but then stopped helping and he went to the ER on 04/29/13 he went to the ER and wanted to get checked out completely as he had recurrent Sx without definitive answer. He had been taking a baby aspirin for about 8 months per PCP. He has had prior episodes of dizziness about 3 times in the last 6 months and one time some 4 years ago, lasting for hours at a time. He reported no recurrent headaches, but does report a slight neck since July. He went to the chiropractor at the end of July and has had some chiropractic treatment including neck manipulation one time, however, his symptoms were present before and did not get worse after. 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 His vertiginous symptoms are made worse with head movements. He has no current vertigo, but does not feel completely right either he states. He had a CT angiogram head and neck on 04/30/2013 which showed right vertebral artery to be markedly attenuated in caliber, smaller than the size of the transverse foramen. There was suspicion for dissection. CTA head showed no significant abnormalities. MRI brain  without contrast showed abnormal appearance of the right vertebral artery which appears occluded and no acute infarct was seen. Neurology was consulted while the patient was in the emergency room and recommended close followup outpatient and addition of Plavix.    His Past Medical History Is Significant For: Past Medical History  Diagnosis Date  . Diabetes mellitus without complication   . Dissection of vertebral artery 05/17/2013    His Past Surgical History Is Significant For: History reviewed. No pertinent past surgical history.  His Family History Is Significant For: Family History  Problem Relation Age of Onset  . Stomach cancer Mother   . Rectal cancer Father     Her Social History Is Significant For: History   Social History  . Marital Status: Married    Spouse  Name: sharon    Number of Children: 2  . Years of Education: college   Occupational History  .  [employer]   Social History Main Topics  . Smoking status: Never Smoker   . Smokeless tobacco: Never Used  . Alcohol Use: Yes     Comment: occasional  . Drug Use: No  . Sexual Activity: None   Other Topics Concern  . None   Social History Narrative   Patient is right handed, resides with wife    His Allergies Are:  No Known Allergies:   His Current Medications Are:  Outpatient Encounter Prescriptions as of 08/30/2014  Medication Sig  . clopidogrel (PLAVIX) 75 MG tablet Take 1 tablet (75 mg total) by mouth daily.  . [facility] ASPIRIN CHILD 81 MG chewable tablet TAKE 1 TABLET BY MOUTH EVERY DAY  . glimepiride (AMARYL) 4 MG tablet TAKE 1 TABLET (4 MG TOTAL) BY MOUTH DAILY.  TAKE WITH FOOD. MONITOR FOR LOW SUGAR  "NEEDS OFFICE VISIT FOR ADDITIONAL REFILLS"  . ketoconazole (NIZORAL) 2 % cream Apply 1 application topically daily as needed for irritation. 2 weeks at a time.  Marland Kitchen ketoconazole (NIZORAL) 2 % shampoo Apply 1 application topically 2 (two) times a week.  . meloxicam (MOBIC) 15 MG tablet Take 1 tablet by  mouth daily.  . metFORMIN (GLUCOPHAGE) 1000 MG tablet Take 1,000 mg by mouth 2 (two) times daily with a meal.  . nystatin (MYCOSTATIN) 100000 UNIT/ML suspension Use as directed 10 mLs (1,000,000 Units total) in the mouth or throat 4 (four) times daily. Gargle, switch and spit  . ofloxacin (OCUFLOX) 0.3 % ophthalmic solution Place 1 drop into the right eye 4 (four) times daily.  . valACYclovir (VALTREX) 500 MG tablet Take 4 tabs po every 12 hours x 2 doses, then 1 tab daily until gone.  Marland Kitchen VIAGRA 100 MG tablet Take 1 tablet by mouth as needed.  Marland Kitchen ZETIA 10 MG tablet Take 1 tablet by mouth daily.  :  Review of Systems:  Out of a complete 14 point review of systems, all are reviewed and negative with the exception of these symptoms as listed below:   Review of Systems  All other systems reviewed and are negative.   Objective:  Neurologic Exam  Physical Exam Physical Examination:   Filed Vitals:   08/30/14 1146  BP: 123/80  Pulse: 75  Temp: 97.7 F (36.5 C)    General Examination: The patient is a very pleasant 63 y.o. male in no acute distress. He appears well-developed and well-nourished and well groomed. He is overweight.   HEENT: Normocephalic, atraumatic, pupils are equal, round and reactive to light and accommodation. Funduscopic exam is normal with sharp disc margins noted. Extraocular tracking is good without limitation to gaze excursion or nystagmus noted. Normal smooth pursuit is noted. Hearing is grossly intact. Face is symmetric with normal facial animation and normal facial sensation. Speech is clear with no dysarthria noted. There is no hypophonia. There is no lip, neck/head, jaw or voice tremor. Neck is supple with full range of passive and active motion. There are no carotid bruits on auscultation. Oropharynx exam reveals: mild mouth dryness, adequate dental hygiene and marked airway crowding, due to redundant soft palate and large her uvula, tonsillar size are 1+, large  tongue. Mallampati is class III. Tongue protrudes centrally and palate elevates symmetrically.  Chest: Clear to auscultation without wheezing, rhonchi or crackles noted.  Heart: S1+S2+0, regular and normal without murmurs, rubs or gallops  noted.   Abdomen: Soft, non-tender and non-distended with normal bowel sounds appreciated on auscultation.  Extremities: There is no pitting edema in the distal lower extremities bilaterally. Pedal pulses are intact.  Skin: Warm and dry without trophic changes noted. There are no varicose veins.  Musculoskeletal: exam reveals no obvious joint deformities, tenderness or joint swelling or erythema.   Neurologically:  Mental status: The patient is awake, alert and oriented in all 4 spheres. His memory, attention, language and knowledge are appropriate. There is no aphasia, agnosia, apraxia or anomia. Speech is clear with normal prosody and enunciation. Thought process is linear. Mood is congruent and affect is normal.  Cranial nerves are as described above under HEENT exam. In addition, shoulder shrug is normal with equal shoulder height noted. Motor exam: Normal bulk, strength and tone is noted. There is no drift, tremor or rebound. Romberg is negative, he has no vertigo. Reflexes are 2+ throughout. Toes are downgoing bilaterally. Fine motor skills are intact with normal finger taps, normal hand movements, normal rapid alternating patting, normal foot taps and normal foot agility.  Cerebellar testing shows no dysmetria or intention tremor on finger to nose testing. Heel to shin is unremarkable bilaterally. There is no truncal or gait ataxia.  Sensory exam is intact to light touch, pinprick, vibration, temperature sense in the upper and lower extremities.  Gait, station and balance are unremarkable. No veering to one side is noted. No leaning to one side is noted. Posture is age-appropriate and stance is narrow based. No problems turning are noted. He turns en  bloc. Tandem walk is unremarkable.             Assessment and Plan:   In summary, WALKER SITAR is a very pleasant 63 year old male with a history of diabetes, obesity, hyperlipidemia and vertebral artery dissection in 2014, who presents for follow-up consultation of his severe obstructive sleep apnea. He has lately not been compliant with treatment and today I spent a long time discussing his sleep study results from before and his recent compliance data. Physically he is doing very well at this time. He has no evidence or symptoms of diabetic neuropathy. He has had his diabetes eye exam. He has had no significant vertigo symptoms at this time. He feels well. He also endorses that he sleeps better when he was on CPAP. He is motivated to get back on track with that. I've asked him to follow instructions how to clean the mask regularly and when to change his supplies. He is furthermore advised to get in touch with his DME company for further instructions for this. I would like to see him back in 3 or 4 months from now, sooner if the need arises. He is encouraged to call us or email me with any interim questions, concerns, problems or updates. I answered all his questions today and the patient was in agreement with the above outlined plan.

## 2014-10-23 ENCOUNTER — Encounter: Payer: Self-pay | Admitting: Diagnostic Neuroimaging

## 2014-10-23 ENCOUNTER — Ambulatory Visit (INDEPENDENT_AMBULATORY_CARE_PROVIDER_SITE_OTHER): Payer: No Typology Code available for payment source | Admitting: Diagnostic Neuroimaging

## 2014-10-23 VITALS — BP 131/83 | HR 98 | Temp 97.8°F | Ht 68.0 in | Wt 256.6 lb

## 2014-10-23 DIAGNOSIS — I7774 Dissection of vertebral artery: Secondary | ICD-10-CM

## 2014-10-23 NOTE — Progress Notes (Signed)
PATIENT: Kirk Oneal DOB: 10-Jan-1951  REASON FOR VISIT: routine follow up for vertebral artery dissection HISTORY FROM: patient  Chief Complaint  Patient presents with  . Follow-up    post neck injury      HISTORY OF PRESENT ILLNESS:  UPDATE 10/23/61: Since last visit, doing well. No new neuro events. Notes some int tingling/pain in feet, wondering if it is diabetic neuropathy. Also using CPAP under direction of Dr. Rexene Alberts.   UPDATE 04/17/14 (LL): Since last visit patient has had no recurrent neurovascular symptoms, denies vertigo. His blood pressure is well controlled it is 119/71 in the office today. He had split-night sleep study which showed obstructive sleep apnea and is now on CPAP, seeing Dr. Rexene Alberts. He states his fasting blood sugars are between 110 and 120, and his last hemoglobin A1c was 8.4; he is now on 3 oral diabetic medications. His last LDL cholesterol was 92.  He is lost about 5 pounds in the last 6 months. He is tolerating Plavix well with no signs of significant bleeding or bruising. He has no complaints today.  UPDATE 10/10/13 (LL): Kirk Oneal comes to the office for follow up for vertebral artery dissection and to review test results. His CT Angiogram of head and neck was stable, showing unchanged diminutive right vertebral artery since August. His 30-day cardiac monitor showed NSR. 2D Echo was normal. Lab work was normal except elevated HgbA1c of 8.8 which he is working on. He has had no further vertigo or other neurological symptoms since last office visit. He is doing well.   PRIOR HPI (06/03/13, VRP): 64 year old right-handed male with diabetes, here for evaluation of vertebral artery dissection. 4 years ago patient had episode of vertigo with nausea and vomiting. In the past 6 months he has had 3 more episodes. Initially he felt that his left ear was "stopped up". He then had severe spinning, nausea and vomiting sensation. Patient went to medical Dr. for evaluation  initially. He then went to the hospital on 04/30/13 for another event. Patient had MRI of the brain which showed no acute stroke but raise the possibility of right vertebral artery occlusion. This is followed up with CT exam of the head and neck which showed thin caliber right vertebral artery, raising possibility for vertebral artery dissection versus atherosclerosis. Patient was on aspirin before this was discovered. He was then discharged on aspirin and Plavix. He saw my colleague Dr. Rexene Alberts on 05/17/13, who recommended continuing dual antiplatelet therapy. Patient is now referred to me for second opinion. Since last office visit, patient has had one more event of spinning and nausea and vomiting. No double vision, slurred speech, trouble swallowing, syncope. He has some intermittent neck pain in the right posterior region. He continues to have some problems with anxiety and balance difficulty.    REVIEW OF SYSTEMS: Full 14 system review of systems performed and notable only as per HPI. Otherwise negative.   ALLERGIES: No Known Allergies  HOME MEDICATIONS: Outpatient Prescriptions Prior to Visit  Medication Sig Dispense Refill  . clopidogrel (PLAVIX) 75 MG tablet Take 1 tablet (75 mg total) by mouth daily. 90 tablet 3  . ketoconazole (NIZORAL) 2 % cream Apply 1 application topically daily as needed for irritation. 2 weeks at a time. 30 g 0  . ketoconazole (NIZORAL) 2 % shampoo Apply 1 application topically 2 (two) times a week. 120 mL 0  . meloxicam (MOBIC) 15 MG tablet Take 1 tablet by mouth daily.    Marland Kitchen  metFORMIN (GLUCOPHAGE) 1000 MG tablet Take 1,000 mg by mouth 2 (two) times daily with a meal.    . VIAGRA 100 MG tablet Take 1 tablet by mouth as needed.    Marland Kitchen ZETIA 10 MG tablet Take 1 tablet by mouth daily.    . [facility] ASPIRIN CHILD 81 MG chewable tablet TAKE 1 TABLET BY MOUTH EVERY DAY (Patient not taking: Reported on 10/23/2014) 90 tablet 0  . glimepiride (AMARYL) 4 MG tablet TAKE 1 TABLET (4 MG  TOTAL) BY MOUTH DAILY.  TAKE WITH FOOD. MONITOR FOR LOW SUGAR  "NEEDS OFFICE VISIT FOR ADDITIONAL REFILLS" 30 tablet 0  . nystatin (MYCOSTATIN) 100000 UNIT/ML suspension Use as directed 10 mLs (1,000,000 Units total) in the mouth or throat 4 (four) times daily. Gargle, switch and spit 240 mL 0  . ofloxacin (OCUFLOX) 0.3 % ophthalmic solution Place 1 drop into the right eye 4 (four) times daily. 5 mL 0  . valACYclovir (VALTREX) 500 MG tablet Take 4 tabs po every 12 hours x 2 doses, then 1 tab daily until gone. 38 tablet 0   No facility-administered medications prior to visit.    PHYSICAL EXAM Filed Vitals:   10/23/14 1148  BP: 131/83  Pulse: 98  Temp: 97.8 F (36.6 C)  TempSrc: Oral  Height: 5\' 8"  (1.727 m)  Weight: 256 lb 9.6 oz (116.393 kg)   Body mass index is 39.02 kg/(m^2).  Generalized: Well developed, in no acute distress Neck: Supple, no carotid bruits  Cardiac: Regular rate rhythm, no murmur    Neurological examination  Mentation: Alert oriented to time, place, history taking. Follows all commands speech and language fluent  Cranial nerve II-XII: Pupils were equal round reactive to light extraocular movements were full, visual field were full on confrontational test. Facial sensation and strength were normal. hearing was intact to finger rubbing bilaterally. Uvula tongue midline. head turning and shoulder shrug and were normal and symmetric.Tongue protrusion into cheek strength was normal.  Motor: normal bulk and tone, full strength in the BUE, BLE, fine finger movements normal, no pronator drift. No focal weakness  Sensory: normal and symmetric to light touch, pinprick; VIB IN RIGHT TOE 8 SEC; VIB IN LEFT TOE 5 SEC.  Coordination: finger-nose-finger normal Reflexes: Deep tendon reflexes in the upper and lower extremities are present and symmetric; TRACE AT ANKLES Gait and Station: narrow based gait; romberg is negative   DIAGNOSTIC DATA (LABS, IMAGING, TESTING) - I  reviewed patient records, labs, notes, testing and imaging myself where available.  Lab Results  Component Value Date   WBC 7.5 08/06/2014   HGB 14.1 08/06/2014   HCT 43.6 08/06/2014   MCV 94.7 08/06/2014      Component Value Date/Time   NA 138 08/06/2014 1228   K 4.5 08/06/2014 1228   CL 101 08/06/2014 1228   CO2 28 08/06/2014 1228   GLUCOSE 168* 08/06/2014 1228   BUN 11 08/06/2014 1228   BUN 12 08/30/2013 0913   CREATININE 0.97 08/06/2014 1228   CREATININE 0.87 08/30/2013 0913   CALCIUM 9.5 08/06/2014 1228   PROT 7.1 08/06/2014 1228   ALBUMIN 4.2 08/06/2014 1228   AST 29 08/06/2014 1228   ALT 38 08/06/2014 1228   ALKPHOS 59 08/06/2014 1228   BILITOT 0.3 08/06/2014 1228   GFRNONAA 84 05/05/2014 1606   GFRNONAA 93 08/30/2013 0913   GFRAA >89 05/05/2014 1606   GFRAA 108 08/30/2013 0913   Lab Results  Component Value Date   CHOL 153 03/10/2014  HDL 38* 03/10/2014   LDLCALC 92 03/10/2014   TRIG 114 03/10/2014   CHOLHDL 4.0 03/10/2014   Lab Results  Component Value Date   HGBA1C 7.8 08/06/2014    I reviewed images myself and agree with interpretation. -VRP  09/05/13 CTA head/neck  1. Unchanged diminutive right vertebral artery since August. As before this could related either to chronic atherosclerotic stenosis or previous dissection. 2. Stable and otherwise negative intracranial CTA. 3. Stable and normal CT appearance of the brain.  04/30/13 MRI brain - Abnormal appearance of the right vertebral artery which appears occluded. Question if this is related to a dissection or atherosclerotic changes. CT angiogram or MR angiogram can be obtained for further delineation. No acute infarct.   ASSESSMENT:  64 y.o. 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 Patient has good contralateral vertebral artery flow and flow through the basilar artery. Patient  with no recurrent neurological symptoms.  Now being treated by Dr. Rexene Alberts for obstructive sleep apnea.   PLAN:  - Continue clopidogrel 75 mg orally every day for secondary stroke prevention and maintain strict control of hypertension with blood pressure goal below 130/90, diabetes with hemoglobin A1c goal below 7% and lipids with LDL cholesterol goal below 70 mg/dL; rsk factor mgmt (diabetes, cholesterol) per PCP.   - Advised patient to get regular cardiovascular exercise at least 30 minutes per day for 5 days per week to reduce weight, blood sugar and cholesterol. - Follow up with me as needed   Return if symptoms worsen or fail to improve, for return to PCP.    Penni Bombard, MD 05/26/5037, 88:28 PM Certified in Neurology, Neurophysiology and Neuroimaging  [facility] [address], [place], [place] [address] [phone]

## 2014-10-23 NOTE — Patient Instructions (Signed)
Continue clopidogrel.  Follow up with PCP about diabetes and high blood pressure.

## 2015-01-19 ENCOUNTER — Ambulatory Visit (INDEPENDENT_AMBULATORY_CARE_PROVIDER_SITE_OTHER): Payer: No Typology Code available for payment source | Admitting: Neurology

## 2015-01-19 ENCOUNTER — Encounter: Payer: Self-pay | Admitting: Neurology

## 2015-01-19 VITALS — BP 132/80 | HR 80 | Resp 18 | Ht 69.0 in | Wt 254.0 lb

## 2015-01-19 DIAGNOSIS — I7774 Dissection of vertebral artery: Secondary | ICD-10-CM

## 2015-01-19 DIAGNOSIS — G4733 Obstructive sleep apnea (adult) (pediatric): Secondary | ICD-10-CM

## 2015-01-19 DIAGNOSIS — Z9989 Dependence on other enabling machines and devices: Principal | ICD-10-CM

## 2015-01-19 DIAGNOSIS — L609 Nail disorder, unspecified: Secondary | ICD-10-CM

## 2015-01-19 DIAGNOSIS — E669 Obesity, unspecified: Secondary | ICD-10-CM

## 2015-01-19 DIAGNOSIS — L602 Onychogryphosis: Secondary | ICD-10-CM

## 2015-01-19 NOTE — Patient Instructions (Addendum)
Please continue using your CPAP regularly. While your insurance requires that you use CPAP at least 4 hours each night on 70% of the nights, I recommend, that you not skip any nights and use it throughout the night if you can. Getting used to CPAP and staying with the treatment long term does take time and patience and discipline. Untreated obstructive sleep apnea when it is moderate to severe can have an adverse impact on cardiovascular health and raise her risk for heart disease, arrhythmias, hypertension, congestive heart failure, stroke and diabetes. Untreated obstructive sleep apnea causes sleep disruption, nonrestorative sleep, and sleep deprivation. This can have an impact on your day to day functioning and cause daytime sleepiness and impairment of cognitive function, memory loss, mood disturbance, and problems focussing. Using CPAP regularly can improve these symptoms.  Please see a dermatologist, as you report moles on your skin.  Please trim your toe nails, as you can have problems with ingrown toe nails or toe nail fungus.

## 2015-01-19 NOTE — Progress Notes (Signed)
Subjective:    Patient ID: Kirk Oneal is a 64 y.o. male.  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 He is unaccompanied today. I last saw him on 08/30/2014, at which time he reported a upper respiratory infection in July 2015 for which he was treated with antibiotics. I explained to him that an upper respiratory infection was unlikely due to CPAP. It also did not cause fever blisters. He was asking whether his fever blisters work has been CPAP. He was not rinsing his mask every day however. He did admit that with CPAP he was sleeping better. I again spent a long time explaining sleep study findings to him and advised him to be compliant with treatment. In the interim, he was seen by my colleague Kirk Oneal in follow-up of his vertebral artery dissection and was advised to follow-up as needed as he was deemed stable. I reviewed Kirk Oneal office note from 10/23/2014 and agree with his recommendations. The patient was counseled on secondary stroke prevention.  Today, 01/19/2015: I reviewed his CPAP compliance data from 12/18/2014 through 01/16/2015 which is a total of 30 days during which time he used his machine only 14 days with percent used days greater than 4 hours at 0%, indicating noncompliance. Average AHI 1.5 per hour 95th percentile pressure at 10.3 cm, leak low. Auto set pressure of 7-12 cm with EPR of 2. Average usage for all days only 39 minutes.   Today, 01/19/2015: He reports that he works nights and uses it very little. When he does not work he does not use his CPAP for some reason. When he does work which is at night he comes home and falls asleep in the chair for at least 3 hours during which time he does not  use his CPAP. He then may take a nap before going into work and may use CPAP then. He does realize that he is not compliant with treatment. He also endorses that we have talked about this before. Thankfully, he has had no new symptoms neurologically or otherwise. He has some benign appearing moles on his neck area and was wondering if these could be looked at. I advised him to make an appointment with dermatology to make sure. He is up for an eye exam he states. He wears contacts. He may not drink enough water.  Previously:   I saw him on 03/23/2014, at which time he reported that he had difficulty adjusting to CPAP because of his PTSD. He was compliant after an initial adjustment time. He still had some anxiety issues with the mask. He was able to tolerate the nasal mask fairly well. He did endorse feeling better with his sleep since being on treatment. He denied any vertigo or dizziness.   I reviewed his compliance data from 07/29/2014 through 08/27/2014 which is a total of 30 days during which time he used his CPAP only 3 nights with percent used days greater than 4 hours of 0%, indicating noncompliance. Average usage for all days was 11 minutes, he is on AutoPap. Residual AHI 1.5 per hour leak low.  I first met him on 05/17/2013 at which time he reported a recent emergency room visit on 04/29/2013 with a six-week history of dizziness. At the time  of his first visit with me I suggested an evaluation with my colleague Kirk Oneal and he saw him on 06/03/2013 with further imaging studies requested. I ordered a sleep study, as he reported snoring, and nonrestorative sleep. He had a split-night sleep study on 01/06/2014 and I went over his test results with him in detail today. His baseline sleep efficiency was reduced at 80% with a latency to sleep of 1.5 minutes and wake after sleep onset of 5.5 minutes with mild sleep fragmentation noted. He had an increased percentage of stage II sleep, absence of  slow-wave sleep and 21.3% of REM sleep with a reduced REM latency of 3.5 minutes. He had no significant periodic leg movements or EKG changes. Mild to moderate snoring was noted. His total AHI was 33 per hour. Baseline oxygen saturation was 90%, nadir was 75% in REM sleep. He was then titrated on CPAP. He had a very low sleep efficiency. He had 2 very long periods of wakefulness. He had an increased percentage of light stage sleep, absence of slow-wave sleep, and 17.3% of REM sleep. Average oxygen saturation was 93%, nadir was 83%. Time below 88% saturation was 43 seconds. Snoring was eliminated. He was started on CPAP of 5 cm and titrated to 7 cm. He did have a resolution of his sleep disordered breathing but very poor sleep consolidation and I felt this was not an adequate CPAP titration. I therefore placed the patient on autoPAP.  I reviewed the patient's CPAP compliance data from 02/21/14 to 03/22/14, which is a total of 30 days, during which time the patient used CPAP on 21 nights, but consistently in the last 21 days. The average usage for all days was only 1 hour and 59 minutes but for the last couple of weeks when he used it consistently the average usage was 2 hours and 50 minutes. His AutoPap is set between 7 and 12 with CPR of 2 and 95th percentile pressure is 10.4 cm leak is low at 7.1 L per minute at the 95th percentile and AHI low at 1.4 per hour indicating adequate settings at this time.    He went to [facility] in July 2014, and was told he had a UTI. He went back and was given an Rx for meclizine, which did help in the beginning, but then stopped helping and he went to the ER on 04/29/13 he went to the ER and wanted to get checked out completely as he had recurrent Sx without definitive answer. He had been taking a baby aspirin for about 8 months per PCP. He has had prior episodes of dizziness about 3 times in the last 6 months and one time some 4 years ago, lasting for hours at a time. He reported no  recurrent headaches, but does report a slight neck since July. He went to the chiropractor at the end of July and has had some chiropractic treatment including neck manipulation one time, however, his symptoms were present before and did not get worse after. 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 His vertiginous symptoms are made worse with head movements. He has no current vertigo, but does not feel completely right either he states. He had a CT angiogram head and neck on 04/30/2013 which showed right vertebral artery to be markedly attenuated in caliber, smaller than the size of the transverse foramen. There was suspicion for  dissection. CTA head showed no significant abnormalities. MRI brain without contrast showed abnormal appearance of the right vertebral artery which appears occluded and no acute infarct was seen. Neurology was consulted while the patient was in the emergency room and recommended close followup outpatient and addition of Plavix.     His Past Medical History Is Significant For: Past Medical History  Diagnosis Date  . Diabetes mellitus without complication   . 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  His Family History Is Significant For: Family History  Problem Relation Age of Onset  . Stomach cancer Mother   . Rectal cancer Father     His Social History Is Significant For: History   Social History  . Marital Status: Married    Spouse Name: sharon  . Number of Children: 2  . Years of Education: college   Occupational History  .  [employer]   Social History Main Topics  . Smoking status: Never Smoker   . Smokeless tobacco: Never Used  . Alcohol Use: 0.0 oz/week    0 Standard drinks or equivalent per week     Comment: occasional  . Drug Use: No  . Sexual Activity: Not  on file   Other Topics Concern  . None   Social History Narrative   Patient is right handed, resides with wife   1cup of coffee a day     His Allergies Are:  No Known Allergies:   His Current Medications Are:  Outpatient Encounter Prescriptions as of 01/19/2015  Medication Sig  . clopidogrel (PLAVIX) 75 MG tablet Take 1 tablet (75 mg total) by mouth daily.  Marland Kitchen glimepiride (AMARYL) 4 MG tablet   . ketoconazole (NIZORAL) 2 % cream Apply 1 application topically daily as needed for irritation. 2 weeks at a time.  Marland Kitchen ketoconazole (NIZORAL) 2 % shampoo Apply 1 application topically 2 (two) times a week.  . meloxicam (MOBIC) 15 MG tablet Take 1 tablet by mouth daily.  . metFORMIN (GLUCOPHAGE) 1000 MG tablet Take 1,000 mg by mouth 2 (two) times daily with a meal.  . nystatin (MYCOSTATIN) 100000 UNIT/ML suspension   . VIAGRA 100 MG tablet Take 1 tablet by mouth as needed.  Marland Kitchen ZETIA 10 MG tablet Take 1 tablet by mouth daily.  . valACYclovir (VALTREX) 500 MG tablet   :  Review of Systems:  Out of a complete 14 point review of systems, all are reviewed and negative with the exception of these symptoms as listed below:   Review of Systems  All other systems reviewed and are negative.   Objective:  Neurologic Exam  Physical Exam Physical Examination:   Filed Vitals:   01/19/15 1057  BP: 132/80  Pulse: 80  Resp: 18    General Examination: The patient is a very pleasant 64 y.o. male in no acute distress. He appears well-developed and well-nourished and well groomed. He is overweight.   HEENT: Normocephalic, atraumatic, pupils are equal, round and reactive to light and accommodation. Funduscopic exam is normal with sharp disc margins noted. Extraocular tracking is good without limitation to gaze excursion or nystagmus noted. Normal smooth pursuit is noted. Hearing is grossly intact. Face is symmetric with normal facial animation and normal facial sensation. Speech is clear with no  dysarthria noted. There is no hypophonia. There is no lip, neck/head, jaw or voice tremor. Neck is supple with full range of passive and active  motion. There are no carotid bruits on auscultation. Oropharynx exam reveals: mild to moderate mouth dryness, adequate dental hygiene and marked airway crowding, due to redundant soft palate and large her uvula, tonsillar size are 1+, large tongue. Mallampati is class III. Tongue protrudes centrally and palate elevates symmetrically. He has some benign looking moles around of her right lateral neck area.  Chest: Clear to auscultation without wheezing, rhonchi or crackles noted.  Heart: S1+S2+0, regular and normal without murmurs, rubs or gallops noted.   Abdomen: Soft, non-tender and non-distended with normal bowel sounds appreciated on auscultation.  Extremities: There is no pitting edema in the distal lower extremities bilaterally. Pedal pulses are intact. He has very long toenails. There are some dystrophic changes in his big toenails. He is advised to trim his toenails or see a podiatrist to make sure.  Skin: Warm and dry without trophic changes noted. There are no varicose veins.  Musculoskeletal: exam reveals no obvious joint deformities, tenderness or joint swelling or erythema.   Neurologically:  Mental status: The patient is awake, alert and oriented in all 4 spheres. His memory, attention, language and knowledge are appropriate. There is no aphasia, agnosia, apraxia or anomia. Speech is clear with normal prosody and enunciation. Thought process is linear. Mood is congruent and affect is normal.  Cranial nerves are as described above under HEENT exam. In addition, shoulder shrug is normal with equal shoulder height noted. Motor exam: Normal bulk, strength and tone is noted. There is no drift, tremor or rebound. Romberg is negative, he has no vertigo. Reflexes are 2+ throughout. Toes are downgoing bilaterally. Fine motor skills are intact with  normal finger taps, normal hand movements, normal rapid alternating patting, normal foot taps and normal foot agility.  Cerebellar testing shows no dysmetria or intention tremor on finger to nose testing. Heel to shin is unremarkable bilaterally. There is no truncal or gait ataxia.  Sensory exam is intact to light touch, pinprick, vibration, temperature sense in the upper and lower extremities.  Gait, station and balance are unremarkable. No veering to one side is noted. No leaning to one side is noted. Posture is age-appropriate and stance is narrow based. No problems turning are noted. He turns en bloc. Tandem walk is unremarkable.             Assessment and Plan:   In summary, Kirk Oneal is a very pleasant 64 year old male with a history of diabetes, obesity, hyperlipidemia and vertebral artery dissection in 2014, who presents for follow-up consultation of his severe obstructive sleep apnea with history of noncompliance with CPAP. He says that he is not using CPAP when he does not work and he had a erratic work schedule. I told him using CPAP should have nothing to do with his work schedule. When he works at night he should be using CPAP when he sleeps during the day. We have talked about this at length before. I reiterated the importance of being compliant with treatment. He is advised to see a dermatologist and an eye doctor and consider seeing a podiatrist. He is advised that his long toenails may put him at risk for toenail fungus and problems with ingrown toenails. At this juncture, I would like to see him back in 6 months. We talked about CPAP treatment, OSA on CPAP compliance as well as his prior diagnosis of OSA again today. I advised him to be better hydrated with water and try to lose weight. I answered all his questions  today and the patient was in agreement with the above outlined plan. I spent 15 minutes in total face-to-face time with the patient, more than 50% of which was spent in  counseling and coordination of care, reviewing test results, reviewing medication and discussing or reviewing the diagnosis of OSA, its prognosis and treatment options.

## 2015-01-22 ENCOUNTER — Telehealth: Payer: Self-pay | Admitting: Neurology

## 2015-01-22 NOTE — Telephone Encounter (Signed)
Patient is calling as he needs letter stating that he has dry mouth (as discussed at last appointment) for the [place].  Please call.

## 2015-01-22 NOTE — Telephone Encounter (Signed)
OK to write. 

## 2015-01-23 NOTE — Telephone Encounter (Signed)
I spoke to patient. He just wants a letter that states that Dr. Rexene Alberts observed that the patient has dry mouth. He wants the letter for the purpose of giving to the [place] so that it can be noted in his chart. He will p/u the letter at our office today.

## 2015-01-23 NOTE — Telephone Encounter (Signed)
Please ask him, what he exactly needs in the letter. He has a dry mouth from likely not drinking enough water, I am not sure, why he needs that to say in a letter to the [place]?

## 2015-01-24 ENCOUNTER — Ambulatory Visit (INDEPENDENT_AMBULATORY_CARE_PROVIDER_SITE_OTHER): Payer: No Typology Code available for payment source | Admitting: Family Medicine

## 2015-01-24 VITALS — BP 140/80 | HR 86 | Temp 98.1°F | Resp 16 | Ht 69.0 in | Wt 259.0 lb

## 2015-01-24 DIAGNOSIS — E119 Type 2 diabetes mellitus without complications: Secondary | ICD-10-CM

## 2015-01-24 DIAGNOSIS — B009 Herpesviral infection, unspecified: Secondary | ICD-10-CM

## 2015-01-24 DIAGNOSIS — B360 Pityriasis versicolor: Secondary | ICD-10-CM | POA: Diagnosis not present

## 2015-01-24 DIAGNOSIS — R030 Elevated blood-pressure reading, without diagnosis of hypertension: Secondary | ICD-10-CM

## 2015-01-24 DIAGNOSIS — IMO0001 Reserved for inherently not codable concepts without codable children: Secondary | ICD-10-CM

## 2015-01-24 MED ORDER — KETOCONAZOLE 2 % EX CREA: 1.0000 "application " | TOPICAL_CREAM | Freq: Every day | CUTANEOUS | Status: DC | PRN

## 2015-01-24 MED ORDER — VALACYCLOVIR HCL 500 MG PO TABS: ORAL_TABLET | ORAL | Status: DC

## 2015-01-24 MED ORDER — KETOCONAZOLE 2 % EX SHAM: 1.0000 "application " | MEDICATED_SHAMPOO | CUTANEOUS | Status: DC

## 2015-01-24 NOTE — Progress Notes (Signed)
Chief Complaint:  Chief Complaint  Patient presents with  . Rash    chest, moles on shoulder  . Mouth Lesions    x 2 weeks    HPI: Kirk Oneal is a 64 y.o. male who is here for: 1. Tinea versicolor recurrence he was doing well with the medication of Nizoral shampoo and cream,until he ran out. Marland Kitchen He wants to know if it i smore than 20% of his body.  2. Moles on back and chest and patient wants to know if it is related to scleroderma. He is afraid that he has been exposed all the chemicals that can cause cutaneous symptoms from scleroderma, he has been watching TV and getting brochures.He denies any sausage fingers, finger ulcerations, raynauds, SOB, but he does have ED, joint pain, and tinea.  3. As previously treated for cold sores and they have not come back but it feels they are under his mouth at times and his mouth feels sore. He ran out of his Valtrex. 4. He wants to if he should use Viagra for his Erectile dysfucntion, and wants to know if the scleroderma can cause erectile dysfunction as well.  5. Does not take BP at home   BP Readings from Last 3 Encounters:  01/24/15 140/80  01/19/15 132/80  10/23/14 131/83     Past Medical History  Diagnosis Date  . Diabetes mellitus without complication   . Dissection of vertebral artery 05/17/2013   History reviewed. No pertinent past surgical history. History   Social History  . Marital Status: Married    Spouse Name: sharon  . Number of Children: 2  . Years of Education: college   Occupational History  .  [employer]   Social History Main Topics  . Smoking status: Never Smoker   . Smokeless tobacco: Never Used  . Alcohol Use: 0.0 oz/week    0 Standard drinks or equivalent per week     Comment: occasional  . Drug Use: No  . Sexual Activity: Not on file   Other Topics Concern  . None   Social History Narrative   Patient is right handed, resides with wife   1cup of coffee a day    Family History    Problem Relation Age of Onset  . Stomach cancer Mother   . Rectal cancer Father    No Known Allergies Prior to Admission medications   Medication Sig Start Date End Date Taking? Authorizing Provider  clopidogrel (PLAVIX) 75 MG tablet Take 1 tablet (75 mg total) by mouth daily. 04/17/14  Yes Philmore Pali, NP  glimepiride (AMARYL) 4 MG tablet  08/06/14  Yes Historical Provider, MD  ketoconazole (NIZORAL) 2 % cream Apply 1 application topically daily as needed for irritation. 2 weeks at a time. 01/20/14  Yes Areesha Dehaven P Kaydence Baba, DO  ketoconazole (NIZORAL) 2 % shampoo Apply 1 application topically 2 (two) times a week. 01/20/14  Yes Darly Massi P Meena Barrantes, DO  meloxicam (MOBIC) 15 MG tablet Take 1 tablet by mouth daily. 02/07/14  Yes Historical Provider, MD  metFORMIN (GLUCOPHAGE) 1000 MG tablet Take 1,000 mg by mouth 2 (two) times daily with a meal.   Yes Historical Provider, MD  valACYclovir (VALTREX) 500 MG tablet  08/06/14  Yes Historical Provider, MD  VIAGRA 100 MG tablet Take 1 tablet by mouth as needed. 02/07/14  Yes Historical Provider, MD  nystatin (MYCOSTATIN) 100000 UNIT/ML suspension  08/06/14   Historical Provider, MD  ZETIA 10  MG tablet Take 1 tablet by mouth daily. 03/18/13   Historical Provider, MD     ROS: The patient denies fevers, chills, night sweats, unintentional weight loss, chest pain, palpitations, wheezing, dyspnea on exertion, nausea, vomiting, abdominal pain, dysuria, hematuria, melena, numbness, weakness, or tingling.   All other systems have been reviewed and were otherwise negative with the exception of those mentioned in the HPI and as above.    PHYSICAL EXAM: Filed Vitals:   01/24/15 1413  BP: 140/80  Pulse: 86  Temp: 98.1 F (36.7 C)  Resp: 16   Filed Vitals:   01/24/15 1413  Height: 5\' 9"  (1.753 m)  Weight: 259 lb (117.482 kg)   Body mass index is 38.23 kg/(m^2).  General: Alert, no acute distress HEENT:  Normocephalic, atraumatic, oropharynx patent. EOMI,  PERRLA Cardiovascular:  Regular rate and rhythm, no rubs murmurs or gallops.  No Carotid bruits, radial pulse intact. No pedal edema.  Respiratory: Clear to auscultation bilaterally.  No wheezes, rales, or rhonchi.  No cyanosis, no use of accessory musculature GI: No organomegaly, abdomen is soft and non-tender, positive bowel sounds.  No masses. Skin: + tinea rash on chest and back, not greater than 10% of body; fingers are normal. Cap refill good, no e/o Raynauds Neurologic: Facial musculature symmetric. Psychiatric: Patient is appropriate throughout our interaction. Lymphatic: No cervical lymphadenopathy Musculoskeletal: Gait intact.   LABS: Results for orders placed or performed in visit on 08/06/14  Comprehensive metabolic panel  Result Value Ref Range   Sodium 138 135 - 145 mEq/L   Potassium 4.5 3.5 - 5.3 mEq/L   Chloride 101 96 - 112 mEq/L   CO2 28 19 - 32 mEq/L   Glucose, Bld 168 (H) 70 - 99 mg/dL   BUN 11 6 - 23 mg/dL   Creat 0.97 0.50 - 1.35 mg/dL   Total Bilirubin 0.3 0.2 - 1.2 mg/dL   Alkaline Phosphatase 59 39 - 117 U/L   AST 29 0 - 37 U/L   ALT 38 0 - 53 U/L   Total Protein 7.1 6.0 - 8.3 g/dL   Albumin 4.2 3.5 - 5.2 g/dL   Calcium 9.5 8.4 - 10.5 mg/dL  HIV antibody  Result Value Ref Range   HIV 1&2 Ab, 4th Generation NONREACTIVE NONREACTIVE  Hepatitis A antibody, total  Result Value Ref Range   Hep A Total Ab NON REACTIVE NON REACTIVE  Hepatitis B core antibody, total  Result Value Ref Range   Hep B Core Total Ab NON REACTIVE NON REACTIVE  Hepatitis B e antibody  Result Value Ref Range   Hepatitis Be Antibody NON-REACTIVE NON-REACTIVE  Hepatitis B surface antibody  Result Value Ref Range   Hep B S Ab NEG NEGATIVE  Hepatitis C antibody  Result Value Ref Range   HCV Ab NEGATIVE NEGATIVE  POCT CBC  Result Value Ref Range   WBC 7.5 4.6 - 10.2 K/uL   Lymph, poc 2.9 0.6 - 3.4   POC LYMPH PERCENT 38.3 10 - 50 %L   MID (cbc) 0.4 0 - 0.9   POC MID % 5.7 0 -  12 %M   POC Granulocyte 4.2 2 - 6.9   Granulocyte percent 56.0 37 - 80 %G   RBC 4.60 (A) 4.69 - 6.13 M/uL   Hemoglobin 14.1 14.1 - 18.1 g/dL   HCT, POC 43.6 43.5 - 53.7 %   MCV 94.7 80 - 97 fL   MCH, POC 30.6 27 - 31.2 pg   MCHC  32.3 31.8 - 35.4 g/dL   RDW, POC 14.1 %   Platelet Count, POC 382 142 - 424 K/uL   MPV 6.6 0 - 99.8 fL  POCT glucose (manual entry)  Result Value Ref Range   POC Glucose 162 (A) 70 - 99 mg/dl  POCT glycosylated hemoglobin (Hb A1C)  Result Value Ref Range   Hemoglobin A1C 7.8   POCT SEDIMENTATION RATE  Result Value Ref Range   POCT SED RATE 74 (A) 0 - 22 mm/hr     EKG/XRAY:   Primary read interpreted by Dr. Marin Comment at [facility].   ASSESSMENT/PLAN: Encounter Diagnoses  Name Primary?  . Tinea versicolor Yes  . Recurrent HSV (herpes simplex virus)   . Type 2 diabetes mellitus without complication    This is a 47 AA male with a history of HTN, DM, vertebral dissection, ED . He is being followed by neurology. His PCPs at the [place]. The PCP takes care of his diabetes and is monitoring his elevated blood pressure. The neurologist would like his blood pressure to be less than 130/90 and his LDL to be less than 70  Today he Would Like me to check on the lab Prices for scleroderma workup which he is slightly fixated on: CBC, CK,anti Scl70, Antitopisomerase antibody, anti RNA polymerase III antibody, RF, anti Tamala Julian He will get prior basic labs that he recently had done  and the phone number his [place] doctor to me and I will call them I have reassurred him that he does not have in my opinion systemic scleroderma sxs. He would still like to get tested but wants to know the out of pocket cost first.  He will continue with Nizoral cream and shampoo for recurrence of tinea. I will refill these medicines.  I will give him a prescription for Valtrex for HSV 1 as needed He was advised that his blood pressure needs to be below 130/90 and also LDL needs to be less than 70 due to his  history of vertebral dissection More than 30 min spent face to face time with patient counseling him on all his medical concerns.  Gross sideeffects, risk and benefits, and alternatives of medications d/w patient. Patient is aware that all medications have potential sideeffects and we are unable to predict every sideeffect or drug-drug interaction that may occur.  Robb Sibal, [place], DO 01/24/2015 2:52 PM

## 2015-01-26 ENCOUNTER — Other Ambulatory Visit: Payer: Self-pay | Admitting: Family Medicine

## 2015-02-23 ENCOUNTER — Encounter: Payer: Self-pay | Admitting: Family Medicine

## 2015-06-06 ENCOUNTER — Encounter (HOSPITAL_COMMUNITY): Payer: Self-pay | Admitting: *Deleted

## 2015-06-13 ENCOUNTER — Other Ambulatory Visit: Payer: Self-pay | Admitting: Family Medicine

## 2015-06-13 ENCOUNTER — Encounter: Payer: Self-pay | Admitting: Neurology

## 2015-06-14 NOTE — Telephone Encounter (Signed)
Dr Marin Comment, you saw pt in May, and did Rx this for pt in the past. Do you want to give RFs now?

## 2015-07-06 ENCOUNTER — Telehealth: Payer: Self-pay | Admitting: Family Medicine

## 2015-07-06 NOTE — Telephone Encounter (Signed)
Spoke with patient about coming in for a follow up on diabetes.  He is going to come into the walk in clinic the week of October 17th and see Dr. Truman Hayward to get an A1c and a Urine to check for protein per insurance requirement.s

## 2015-07-10 ENCOUNTER — Ambulatory Visit (INDEPENDENT_AMBULATORY_CARE_PROVIDER_SITE_OTHER): Payer: No Typology Code available for payment source | Admitting: Family Medicine

## 2015-07-10 VITALS — BP 130/70 | HR 70 | Temp 98.5°F | Resp 16 | Ht 69.0 in | Wt 257.0 lb

## 2015-07-10 DIAGNOSIS — I7774 Dissection of vertebral artery: Secondary | ICD-10-CM

## 2015-07-10 DIAGNOSIS — E119 Type 2 diabetes mellitus without complications: Secondary | ICD-10-CM | POA: Diagnosis not present

## 2015-07-10 DIAGNOSIS — E785 Hyperlipidemia, unspecified: Secondary | ICD-10-CM

## 2015-07-10 LAB — LIPID PANEL
Cholesterol: 169 mg/dL (ref 125–200)
HDL: 38 mg/dL — AB (ref >=40)
LDL CALC: 100 mg/dL (ref <130)
TRIGLYCERIDES: 156 mg/dL — AB (ref <150)
Total CHOL/HDL Ratio: 4.4 Ratio (ref <=5.0)
VLDL: 31 mg/dL — ABNORMAL HIGH (ref <30)

## 2015-07-10 LAB — COMPLETE METABOLIC PANEL WITH GFR
ALT: 61 U/L — ABNORMAL HIGH (ref 9–46)
AST: 39 U/L — ABNORMAL HIGH (ref 10–35)
Albumin: 4.7 g/dL (ref 3.6–5.1)
Alkaline Phosphatase: 58 U/L (ref 40–115)
BILIRUBIN TOTAL: 0.4 mg/dL (ref 0.2–1.2)
BUN: 10 mg/dL (ref 7–25)
CHLORIDE: 102 mmol/L (ref 98–110)
CO2: 26 mmol/L (ref 20–31)
Calcium: 9.7 mg/dL (ref 8.6–10.3)
Creat: 0.96 mg/dL (ref 0.70–1.25)
GFR, EST NON AFRICAN AMERICAN: 84 mL/min (ref >=60)
Glucose, Bld: 96 mg/dL (ref 65–99)
Potassium: 4.5 mmol/L (ref 3.5–5.3)
Sodium: 140 mmol/L (ref 135–146)
TOTAL PROTEIN: 7.4 g/dL (ref 6.1–8.1)

## 2015-07-10 LAB — GLUCOSE, POCT (MANUAL RESULT ENTRY): POC Glucose: 102 mg/dl — AB (ref 70–99)

## 2015-07-10 LAB — HEMOGLOBIN A1C: HEMOGLOBIN A1C: 7.5 % — AB (ref 4.0–6.0)

## 2015-07-10 LAB — POCT GLYCOSYLATED HEMOGLOBIN (HGB A1C): HEMOGLOBIN A1C: 7.5

## 2015-07-10 MED ORDER — EZETIMIBE 10 MG PO TABS: 10.0000 mg | ORAL_TABLET | Freq: Every day | ORAL | Status: DC

## 2015-07-10 MED ORDER — GLIMEPIRIDE 4 MG PO TABS: ORAL_TABLET | ORAL | Status: DC

## 2015-07-10 NOTE — Progress Notes (Signed)
Subjective:    Patient ID: Kirk Oneal, male    DOB: Jan 08, 1951, 64 y.o.   MRN: [id number] This chart was scribed for [name], MD by Marti Sleigh, Medical Scribe. This patient was seen in Room 8 and the patient's care was started a 2:31 PM.  Chief Complaint  Patient presents with  . Medication Refill    zetia, glimepiride  . Diabetes    pt would like diabetes to be checked  . Hyperlipidemia    pt would like to get his cholesterol checked    HPI HPI Comments: Kirk Oneal is a 64 y.o. male with a past hx of DM and HLD who presents to [facility] for a medication refill. The pt is normally followed by Dr. Marin Comment, and has also been seen by Dr. Brigitte Pulse. Dr. Delfina Redwood was previously his PCP but he is not seeing him anymore. He is also seen by Dr. Waldemar Dickens at the [facility], and his last visit with him was six months ago. 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 His physicain at the [place] only fills his metformin medication. He states he is not checking his blood sugar at home regularly. He denies missing any doses of any of his medications. He states his blood sugar has dropped to 80 one or two times which has made him feel weak. His normal range is more often in the 130s. He ate last at 9:00 AM this morning. He denies SOB, abdominal pain, blood in stool, chest pain, light headedness, or HA.   Pt stopped taking his Zedia one year ago after taking a cholesterol class, and getting his cholesterol into a normal range.  DM: Lab Results  Component Value Date   HGBA1C 7.8 08/06/2014  He is on metformin 1000mg  BID as well as Amaryl 4mg  QD. He states he had a urine micro albumen at the [place], which was normal at his last visit six months ago.    HLD: Lab Results  Component Value Date   CHOL 153 03/10/2014   HDL 38* 03/10/2014   LDLCALC 92 03/10/2014   TRIG 114 03/10/2014   CHOLHDL 4.0 03/10/2014    Lab Results  Component Value Date   ALT 38 08/06/2014   AST 29 08/06/2014   ALKPHOS 59 08/06/2014   BILITOT 0.3 08/06/2014   He takes Zedia, 10mg  QD. Goal LDL less than 70 as he has a hx of vertebral dissection.   Patient Active Problem List   Diagnosis Date Noted  . Atherosclerosis 04/17/2014  . 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 Diabetes mellitus without complication ([place])   . 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 No pertinent past surgical history. No Known Allergies Prior to Admission medications   Medication Sig Start Date End Date Taking? Authorizing Provider  clopidogrel (PLAVIX) 75 MG tablet TAKE 1 TABLET (75 MG TOTAL) BY MOUTH DAILY. 06/14/15  Yes Thao P Le, DO  glimepiride (AMARYL) 4 MG tablet Take 1 tablet every day. Take with food. Monitor for low sugars. 01/29/15  Yes Thao P Le, DO  ketoconazole (NIZORAL) 2 % cream Apply 1 application topically daily as needed for irritation. 2 weeks at a time. 01/24/15  Yes Thao P Le, DO  ketoconazole (NIZORAL) 2 % shampoo Apply 1 application topically 2 (two) times a week. 01/24/15  Yes Thao P Le, DO  meloxicam (MOBIC) 15 MG tablet  Take 1 tablet by mouth daily. 02/07/14  Yes Historical Provider, MD  metFORMIN (GLUCOPHAGE) 1000 MG tablet Take 1,000 mg by mouth 2 (two) times daily with a meal.   Yes Historical Provider, MD  VIAGRA 100 MG tablet Take 1 tablet by mouth as needed. 02/07/14  Yes Historical Provider, MD  ZETIA 10 MG tablet Take 1 tablet by mouth daily. 03/18/13  Yes Historical Provider, MD  glimepiride (AMARYL) 4 MG tablet  08/06/14   Historical Provider, MD  nystatin (MYCOSTATIN) 100000 UNIT/ML suspension  08/06/14   Historical Provider, MD  valACYclovir (VALTREX) 500 MG tablet Take 4 tabs po every 12 hours x 2 doses, then prn Patient not taking: Reported on 07/10/2015 01/24/15   Thao Susanne Borders, DO   Social History   Social History  . Marital Status: Married    Spouse Name: sharon  . Number of Children: 2  . Years of Education:  college   Occupational History  .  [employer]   Social History Main Topics  . Smoking status: Never Smoker   . Smokeless tobacco: Never Used  . Alcohol Use: 0.0 oz/week    0 Standard drinks or equivalent per week     Comment: occasional  . Drug Use: No  . Sexual Activity: Not on file   Other Topics Concern  . Not on file   Social History Narrative   Patient is right handed, resides with wife   1cup of coffee a day       Review of Systems  Constitutional: Negative for fever, chills, fatigue and unexpected weight change.  Eyes: Negative for visual disturbance.  Respiratory: Negative for cough, chest tightness and shortness of breath.   Cardiovascular: Negative for chest pain, palpitations and leg swelling.  Gastrointestinal: Negative for abdominal pain and blood in stool.  Neurological: Negative for dizziness, light-headedness and headaches.       Objective:   Physical Exam  Constitutional: He is oriented to person, place, and time. He appears well-developed and well-nourished. No distress.  HENT:  Head: Normocephalic and atraumatic.  Eyes: Pupils are equal, round, and reactive to light.  Neck: Neck supple.  Cardiovascular: Normal rate.   Pulmonary/Chest: Effort normal. No respiratory distress.  Musculoskeletal: Normal range of motion.  Neurological: He is alert and oriented to person, place, and time. Coordination normal.  Skin: Skin is warm and dry. He is not diaphoretic.  Psychiatric: He has a normal mood and affect. His behavior is normal.  Nursing note and vitals reviewed.  Results for orders placed or performed in visit on 07/10/15  POCT glycosylated hemoglobin (Hb A1C)  Result Value Ref Range   Hemoglobin A1C 7.5   POCT glucose (manual entry)  Result Value Ref Range   POC Glucose 102 (A) 70 - 99 mg/dl    Filed Vitals:   07/10/15 1305  BP: 130/70  Pulse: 70  Temp: 98.5 F (36.9 C)  TempSrc: Oral  Resp: 16  Height: 5\' 9"  (1.753 m)  Weight: 257  lb (116.574 kg)  SpO2: 99%      Assessment & Plan:   [name] is a 64 y.o. male Controlled type 2 diabetes mellitus without complication, without long-term current use of insulin ([name]) - Plan: POCT glycosylated hemoglobin (Hb A1C), POCT glucose (manual entry), glimepiride (AMARYL) 4 MG tablet  - as few possible borderline lows, will not change regimen at this time, but diet adherence and keeping a record of readings for follow up with PCP discussed.  Hyperlipidemia - Plan: COMPLETE METABOLIC PANEL WITH GFR, Lipid panel, ezetimibe (ZETIA) 10 MG tablet  -unsure of lipid class he was in, but will check lipids as off meds. Base on ASCVD risk assessment, may need to be on statin instead of Zetia, but unsure if he has had a problem with statins prior. Labs pending, refilled Zetia for now, but to discuss statin with his PCP at [facility] hospital or follow up with Dr. Marin Comment.   Dissection, vertebral artery [facility])  Continue plavix - has refills available.   Meds ordered this encounter  Medications  . glimepiride (AMARYL) 4 MG tablet    Sig: Take 1 tablet every day. Take with food. Monitor for low sugars.    Dispense:  30 tablet    Refill:  3  . ezetimibe (ZETIA) 10 MG tablet    Sig: Take 1 tablet (10 mg total) by mouth daily.    Dispense:  90 tablet    Refill:  1   Patient Instructions  I suspect you will need to restart the Zetia. You should receive a call or letter about your lab results within the next week to 10 days.  I would also recommend a statin such as Lipitor or Crestor instead of Zetia since you have diabetes, but this can be discussed with Dr. Marin Comment or your doctor st the [facility] hospital.  You should still have refills left of Plavix.   Keep a record of your blood sugars and bring them to next appointment. Your overall 3 month average is slightly above goal, but with readings in 80's, I will keep meds same for now.  Follow up with Dr. Marin Comment or primary care at [facility] hospital in next 3 months.    Return to the clinic or go to the nearest emergency room if any of your symptoms worsen or new symptoms occur.       I personally performed the services described in this documentation, which was scribed in my presence. The recorded information has been reviewed and considered, and addended by me as needed.   By signing my name below, I, Judithe Modest, attest that this documentation has been prepared under the direction and in the presence of [name], MD. Electronically Signed: Judithe Modest, ER Scribe. 07/10/2015. 2:30 PM.

## 2015-07-10 NOTE — Patient Instructions (Addendum)
I suspect you will need to restart the Zetia. You should receive a call or letter about your lab results within the next week to 10 days.  I would also recommend a statin such as Lipitor or Crestor instead of Zetia since you have diabetes, but this can be discussed with Dr. Marin Comment or your doctor st the [facility] hospital.  You should still have refills left of Plavix.   Keep a record of your blood sugars and bring them to next appointment. Your overall 3 month average is slightly above goal, but with readings in 80's, I will keep meds same for now.  Follow up with Dr. Marin Comment or primary care at [facility] hospital in next 3 months.   Return to the clinic or go to the nearest emergency room if any of your symptoms worsen or new symptoms occur.

## 2015-07-20 ENCOUNTER — Encounter: Payer: Self-pay | Admitting: Family Medicine

## 2015-07-27 ENCOUNTER — Ambulatory Visit: Payer: No Typology Code available for payment source | Admitting: Neurology

## 2015-07-31 ENCOUNTER — Other Ambulatory Visit: Payer: Self-pay

## 2015-07-31 ENCOUNTER — Telehealth: Payer: Self-pay

## 2015-07-31 DIAGNOSIS — E119 Type 2 diabetes mellitus without complications: Secondary | ICD-10-CM

## 2015-07-31 MED ORDER — GLIMEPIRIDE 4 MG PO TABS: ORAL_TABLET | ORAL | Status: DC

## 2015-07-31 MED ORDER — CLOPIDOGREL BISULFATE 75 MG PO TABS: ORAL_TABLET | ORAL | Status: DC

## 2015-07-31 NOTE — Telephone Encounter (Signed)
Patient is calling because the insurance will only cover his medication if he get a 90 day supply. Patient needs a 90 day supply of amaryl and plavix sent to [facility] on [address].

## 2015-07-31 NOTE — Telephone Encounter (Signed)
90day supply sent.

## 2015-08-13 ENCOUNTER — Ambulatory Visit: Payer: No Typology Code available for payment source | Admitting: Neurology

## 2015-08-21 ENCOUNTER — Encounter: Payer: Self-pay | Admitting: Family Medicine

## 2015-09-16 ENCOUNTER — Emergency Department (HOSPITAL_COMMUNITY): Payer: No Typology Code available for payment source

## 2015-09-16 ENCOUNTER — Encounter (HOSPITAL_COMMUNITY): Payer: Self-pay | Admitting: Oncology

## 2015-09-16 ENCOUNTER — Emergency Department (HOSPITAL_COMMUNITY)
Admission: EM | Admit: 2015-09-16 | Discharge: 2015-09-17 | Disposition: A | Discharge status: Home / Self Care | Payer: No Typology Code available for payment source | Attending: Emergency Medicine | Admitting: Emergency Medicine

## 2015-09-16 DIAGNOSIS — Z8679 Personal history of other diseases of the circulatory system: Secondary | ICD-10-CM | POA: Diagnosis not present

## 2015-09-16 DIAGNOSIS — R61 Generalized hyperhidrosis: Secondary | ICD-10-CM | POA: Insufficient documentation

## 2015-09-16 DIAGNOSIS — Z791 Long term (current) use of non-steroidal anti-inflammatories (NSAID): Secondary | ICD-10-CM | POA: Diagnosis not present

## 2015-09-16 DIAGNOSIS — E785 Hyperlipidemia, unspecified: Secondary | ICD-10-CM | POA: Diagnosis not present

## 2015-09-16 DIAGNOSIS — E119 Type 2 diabetes mellitus without complications: Secondary | ICD-10-CM | POA: Insufficient documentation

## 2015-09-16 DIAGNOSIS — R42 Dizziness and giddiness: Secondary | ICD-10-CM | POA: Diagnosis present

## 2015-09-16 DIAGNOSIS — Z79899 Other long term (current) drug therapy: Secondary | ICD-10-CM | POA: Insufficient documentation

## 2015-09-16 DIAGNOSIS — M542 Cervicalgia: Secondary | ICD-10-CM | POA: Diagnosis not present

## 2015-09-16 DIAGNOSIS — Z7982 Long term (current) use of aspirin: Secondary | ICD-10-CM | POA: Diagnosis not present

## 2015-09-16 DIAGNOSIS — H81391 Other peripheral vertigo, right ear: Secondary | ICD-10-CM | POA: Diagnosis not present

## 2015-09-16 DIAGNOSIS — Z7902 Long term (current) use of antithrombotics/antiplatelets: Secondary | ICD-10-CM | POA: Diagnosis not present

## 2015-09-16 DIAGNOSIS — Z7984 Long term (current) use of oral hypoglycemic drugs: Secondary | ICD-10-CM | POA: Insufficient documentation

## 2015-09-16 LAB — BASIC METABOLIC PANEL
Anion gap: 10 (ref 5–15)
BUN: 13 mg/dL (ref 6–20)
CO2: 25 mmol/L (ref 22–32)
Calcium: 9.6 mg/dL (ref 8.9–10.3)
Chloride: 105 mmol/L (ref 101–111)
Creatinine, Ser: 1.04 mg/dL (ref 0.61–1.24)
GFR calc Af Amer: >60 mL/min (ref >60)
GFR calc non Af Amer: >60 mL/min (ref >60)
Glucose, Bld: 197 mg/dL — ABNORMAL HIGH (ref 65–99)
Potassium: 3.2 mmol/L — ABNORMAL LOW (ref 3.5–5.1)
Sodium: 140 mmol/L (ref 135–145)

## 2015-09-16 LAB — CBC
HEMATOCRIT: 38.3 % — AB (ref 39.0–52.0)
HEMOGLOBIN: 13.2 g/dL (ref 13.0–17.0)
MCH: 32 pg (ref 26.0–34.0)
MCHC: 34.5 g/dL (ref 30.0–36.0)
MCV: 93 fL (ref 78.0–100.0)
Platelets: 301 10*3/uL (ref 150–400)
RBC: 4.12 MIL/uL — ABNORMAL LOW (ref 4.22–5.81)
RDW: 13.1 % (ref 11.5–15.5)
WBC: 7.5 10*3/uL (ref 4.0–10.5)

## 2015-09-16 LAB — CBG MONITORING, ED: Glucose-Capillary: 171 mg/dL — ABNORMAL HIGH (ref 65–99)

## 2015-09-16 MED ORDER — MECLIZINE HCL 25 MG PO TABS
50.0000 mg | ORAL_TABLET | Freq: Once | ORAL | Status: AC
  Administered 2015-09-16: 50 mg via ORAL
  Filled 2015-09-16: qty 2

## 2015-09-16 MED ORDER — ONDANSETRON HCL 4 MG/2ML IJ SOLN
4.0000 mg | Freq: Once | INTRAMUSCULAR | Status: AC
  Administered 2015-09-16: 4 mg via INTRAVENOUS
  Filled 2015-09-16: qty 2

## 2015-09-16 MED ORDER — SODIUM CHLORIDE 0.9 % IV BOLUS (SEPSIS)
1000.0000 mL | Freq: Once | INTRAVENOUS | Status: AC
  Administered 2015-09-16: 1000 mL via INTRAVENOUS

## 2015-09-16 NOTE — ED Provider Notes (Signed)
By signing my name below, I, Stephania Fragmin, attest that this documentation has been prepared under the direction and in the presence of [place], DO. Electronically Signed: Stephania Fragmin, ED Scribe. 09/16/2015. 2:40 AM.  TIME SEEN: 11:20 PM  CHIEF COMPLAINT: Dizziness  HPI: Kirk Oneal is a 64 y.o. male with a history of vertebral artery dissection and DM, currently anticoagulated on Plavix, who presents to the Emergency Department complaining of room-spinning dizziness worsened by neck rotation to the right. He states his dizziness began 2.5 hours ago and was preceded by a sensation of bilateral ear congestion, since a fullness. Associated symptoms include nausea, vomiting, and diaphoresis. He reports he has been having issues right-sided neck pain and at one point had been seeing a chiropractor but is no longer seeing one. He denies head or neck injury. He states he is not currently taking a daily aspirin. He denies a history of CVA or hypertension. He reports a history of hyperlipidemia but reports he is noncompliant with his medications for it. He denies hearing loss, tinnitus, ear pain, numbness, tingling, weakness, chest pain, or SOB. He denies a history of smoking.   States he had previous vertiginous symptoms in 2014 after seeing a chiropractor. 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 Was started on Plavix and aspirin. States he is still on Plavix but was told he could stop his aspirin. States he is not seeing a chiropractor recently and is not having any headache or neck pain.  ROS: See HPI Constitutional: no fever  Eyes: no drainage  ENT: no runny nose   Cardiovascular:  no chest pain  Resp: no SOB  GI: vomiting GU: no dysuria Integumentary: no rash  Allergy: no hives  Musculoskeletal: no leg swelling  Neurological: no slurred speech ROS otherwise negative  PAST MEDICAL HISTORY/PAST SURGICAL HISTORY:  Past Medical History   Diagnosis Date  . Diabetes mellitus without complication ([place])   . 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 Authorizing Provider  clopidogrel (PLAVIX) 75 MG tablet TAKE 1 TABLET (75 MG TOTAL) BY MOUTH DAILY. 07/31/15   Chelle Jeffery, PA-C  ezetimibe (ZETIA) 10 MG tablet Take 1 tablet (10 mg total) by mouth daily. 07/10/15   Wendie Agreste, MD  glimepiride (AMARYL) 4 MG tablet Take 1 tablet every day. Take with food. Monitor for low sugars. 07/31/15   Wendie Agreste, MD  ketoconazole (NIZORAL) 2 % cream Apply 1 application topically daily as needed for irritation. 2 weeks at a time. 01/24/15   Thao P Le, DO  ketoconazole (NIZORAL) 2 % shampoo Apply 1 application topically 2 (two) times a week. 01/24/15   Thao P Le, DO  meloxicam (MOBIC) 15 MG tablet Take 1 tablet by mouth daily. 02/07/14   Historical Provider, MD  metFORMIN (GLUCOPHAGE) 1000 MG tablet Take 1,000 mg by mouth 2 (two) times daily with a meal.    Historical Provider, MD  nystatin (MYCOSTATIN) 100000 UNIT/ML suspension  08/06/14   Historical Provider, MD  valACYclovir (VALTREX) 500 MG tablet Take 4 tabs po every 12 hours x 2 doses, then prn Patient not taking: Reported on 07/10/2015 01/24/15   Thao P Le, DO  VIAGRA 100 MG tablet Take 1 tablet by mouth as needed. 02/07/14   Historical Provider, MD    ALLERGIES:  No Known Allergies  SOCIAL HISTORY:  Social History  Substance Use Topics  . Smoking status: Never Smoker   . Smokeless tobacco: Never Used  . Alcohol Use: 0.0 oz/week    0 Standard drinks or equivalent per week     Comment: occasional    FAMILY HISTORY: Family History  Problem Relation Age of Onset  . Stomach cancer Mother   . Rectal cancer Father     EXAM: BP 147/74 mmHg  Pulse 87  Temp(Src) 97.7 F (36.5 C) (Oral)  Resp 16  Ht 5\' 9"  (1.753 m)  Wt 245 lb (111.131 kg)  BMI 36.16 kg/m2  SpO2  100% CONSTITUTIONAL: Alert and oriented and responds appropriately to questions. Well-appearing; well-nourished HEAD: Normocephalic EYES: Conjunctivae clear, PERRL ENT: normal nose; no rhinorrhea; moist mucous membranes; pharynx without lesions noted; TMs clear bilaterally without erythema, bulging, purulence, or perforation; no impacted cerumen NECK: Supple, no meningismus, no LAD  CARD: RRR; S1 and S2 appreciated; no murmurs, no clicks, no rubs, no gallops RESP: Normal chest excursion without splinting or tachypnea; breath sounds clear and equal bilaterally; no wheezes, no rhonchi, no rales, no hypoxia or respiratory distress, speaking full sentences ABD/GI: Normal bowel sounds; non-distended; soft, non-tender, no rebound, no guarding, no peritoneal signs BACK:  The back appears normal and is non-tender to palpation, there is no CVA tenderness EXT: Normal ROM in all joints; non-tender to palpation; no edema; normal capillary refill; no cyanosis, no calf tenderness or swelling    SKIN: Normal color for age and race; warm NEURO: Moves all extremities equally, sensation to light touch intact diffusely, cranial nerves II through XII intact; no nystagmus; no dysmetria to finger-to-nose testing; normal heel-to-shin testing PSYCH: The patient's mood and manner are appropriate. Grooming and personal hygiene are appropriate.  MEDICAL DECISION MAKING: Patient here with likely peripheral vertigo. He states his vertigo is completely gone when he is staying still and present mostly when he turns his head to the right. No other focal neurologic deficits on exam. Given his history of vertebral artery dissection however will proceed with CT of his head and CTA of his neck. We'll give IV fluids, Zofran and meclizine. Labs ordered in triage are unremarkable.  ED PROGRESS:   2:27 AM - On reassessment, pt is completely asymptomatic. Discussed pt's case with Dr. Janann Colonel of neuro hospitalist service, who agrees this  is likely peripheral vertigo and agrees that pt does not need any further emergent workup, and can be discharged from ED with PCP follow-up. Discussed return precautions with pt, who verbalized understanding and agreed to plan. Discussed this with patient and his wife. They verbalize understanding and are comfortable with this plan. We'll discharge with prescriptions for meclizine and Zofran. Discussed at length return precautions. He has been able to ambulate without difficulty.  No further vomiting in the emergency department. He appears very comfortable. They verbalize understanding and are comfortable with this plan.     EKG Interpretation  Date/Time:  Sunday September 16 2015 22:33:33 EST Ventricular Rate:  74 PR Interval:  156 QRS Duration: 88 QT Interval:  383 QTC Calculation: 425 R Axis:   16 Text Interpretation:  Sinus rhythm Abnormal R-wave progression, early transition Borderline T abnormalities, diffuse leads ED PHYSICIAN INTERPRETATION AVAILABLE IN [facility] Confirmed by TEST, Record ([id number]) on 09/17/2015 7:08:11 AM        I personally performed the services described in this documentation, which was scribed in my presence. The recorded information has been reviewed and is accurate.     Delice Bison  Dannette Kinkaid, DO 09/17/15 [id number]

## 2015-09-16 NOTE — ED Notes (Signed)
RN will draw blood work from IV line 

## 2015-09-16 NOTE — ED Notes (Signed)
Pt presents w/ dizziness, N/V and diaphoresis.  Denies pain.  300 ml emesis in triage.  States it began earlier in the week however became much worse approximately 1 hour ago.

## 2015-09-17 ENCOUNTER — Encounter (HOSPITAL_COMMUNITY): Payer: Self-pay

## 2015-09-17 LAB — URINALYSIS, ROUTINE W REFLEX MICROSCOPIC
Bilirubin Urine: NEGATIVE
GLUCOSE, UA: NEGATIVE mg/dL
HGB URINE DIPSTICK: NEGATIVE
Ketones, ur: NEGATIVE mg/dL
LEUKOCYTES UA: NEGATIVE
Nitrite: NEGATIVE
PH: 5 (ref 5.0–8.0)
PROTEIN: NEGATIVE mg/dL
Specific Gravity, Urine: 1.022 (ref 1.005–1.030)

## 2015-09-17 MED ORDER — IOHEXOL 350 MG/ML SOLN
100.0000 mL | Freq: Once | INTRAVENOUS | Status: AC | PRN
  Administered 2015-09-17: 100 mL via INTRAVENOUS

## 2015-09-17 MED ORDER — MECLIZINE HCL 50 MG PO TABS: 50.0000 mg | ORAL_TABLET | Freq: Three times a day (TID) | ORAL | Status: DC | PRN

## 2015-09-17 MED ORDER — ONDANSETRON 4 MG PO TBDP: 4.0000 mg | ORAL_TABLET | Freq: Three times a day (TID) | ORAL | Status: DC | PRN

## 2015-09-17 NOTE — Discharge Instructions (Signed)
Benign Positional Vertigo Vertigo is the feeling that you or your surroundings are moving when they are not. Benign positional vertigo is the most common form of vertigo. The cause of this condition is not serious (is benign). This condition is triggered by certain movements and positions (is positional). This condition can be dangerous if it occurs while you are doing something that could endanger you or others, such as driving.  CAUSES In many cases, the cause of this condition is not known. It may be caused by a disturbance in an area of the inner ear that helps your brain to sense movement and balance. This disturbance can be caused by a viral infection (labyrinthitis), head injury, or repetitive motion. RISK FACTORS This condition is more likely to develop in:  Women.  People who are 50 years of age or older. SYMPTOMS Symptoms of this condition usually happen when you move your head or your eyes in different directions. Symptoms may start suddenly, and they usually last for less than a minute. Symptoms may include:  Loss of balance and falling.  Feeling like you are spinning or moving.  Feeling like your surroundings are spinning or moving.  Nausea and vomiting.  Blurred vision.  Dizziness.  Involuntary eye movement (nystagmus). Symptoms can be mild and cause only slight annoyance, or they can be severe and interfere with daily life. Episodes of benign positional vertigo may return (recur) over time, and they may be triggered by certain movements. Symptoms may improve over time. DIAGNOSIS This condition is usually diagnosed by medical history and a physical exam of the head, neck, and ears. You may be referred to a health care provider who specializes in ear, nose, and throat (ENT) problems (otolaryngologist) or a provider who specializes in disorders of the nervous system (neurologist). You may have additional testing, including:  MRI.  A CT scan.  Eye movement tests. Your  health care provider may ask you to change positions quickly while he or she watches you for symptoms of benign positional vertigo, such as nystagmus. Eye movement may be tested with an electronystagmogram (ENG), caloric stimulation, the Dix-Hallpike test, or the roll test.  An electroencephalogram (EEG). This records electrical activity in your brain.  Hearing tests. TREATMENT Usually, your health care provider will treat this by moving your head in specific positions to adjust your inner ear back to normal. Surgery may be needed in severe cases, but this is rare. In some cases, benign positional vertigo may resolve on its own in 2-4 weeks. HOME CARE INSTRUCTIONS Safety  Move slowly.Avoid sudden body or head movements.  Avoid driving.  Avoid operating heavy machinery.  Avoid doing any tasks that would be dangerous to you or others if a vertigo episode would occur.  If you have trouble walking or keeping your balance, try using a cane for stability. If you feel dizzy or unstable, sit down right away.  Return to your normal activities as told by your health care provider. Ask your health care provider what activities are safe for you. General Instructions  Take over-the-counter and prescription medicines only as told by your health care provider.  Avoid certain positions or movements as told by your health care provider.  Drink enough fluid to keep your urine clear or pale yellow.  Keep all follow-up visits as told by your health care provider. This is important. SEEK MEDICAL CARE IF:  You have a fever.  Your condition gets worse or you develop new symptoms.  Your family or friends   notice any behavioral changes.  Your nausea or vomiting gets worse.  You have numbness or a "pins and needles" sensation. SEEK IMMEDIATE MEDICAL CARE IF:  You have difficulty speaking or moving.  You are always dizzy.  You faint.  You develop severe headaches.  You have weakness in your  legs or arms.  You have changes in your hearing or vision.  You develop a stiff neck.  You develop sensitivity to light.   This information is not intended to replace advice given to you by your health care provider. Make sure you discuss any questions you have with your health care provider.   Document Released: 06/16/2006 Document Revised: 05/30/2015 Document Reviewed: 01/01/2015 Elsevier Interactive Patient Education 2016 [employer]  

## 2016-01-18 ENCOUNTER — Other Ambulatory Visit: Payer: Self-pay | Admitting: Family Medicine

## 2016-01-18 ENCOUNTER — Other Ambulatory Visit: Payer: Self-pay | Admitting: Physician Assistant

## 2016-01-19 NOTE — Telephone Encounter (Signed)
Spoke with patient 30 day supply will be called in he will RTC for follow.  Patient understood

## 2016-02-16 ENCOUNTER — Other Ambulatory Visit: Payer: Self-pay | Admitting: Family Medicine

## 2016-02-22 ENCOUNTER — Ambulatory Visit (INDEPENDENT_AMBULATORY_CARE_PROVIDER_SITE_OTHER): Payer: No Typology Code available for payment source | Admitting: Family Medicine

## 2016-02-22 VITALS — BP 124/88 | HR 82 | Temp 98.1°F | Resp 16 | Ht 69.0 in | Wt 255.0 lb

## 2016-02-22 DIAGNOSIS — I7774 Dissection of vertebral artery: Secondary | ICD-10-CM

## 2016-02-22 DIAGNOSIS — R740 Nonspecific elevation of levels of transaminase and lactic acid dehydrogenase [LDH]: Secondary | ICD-10-CM | POA: Diagnosis not present

## 2016-02-22 DIAGNOSIS — E785 Hyperlipidemia, unspecified: Secondary | ICD-10-CM

## 2016-02-22 DIAGNOSIS — M2141 Flat foot [pes planus] (acquired), right foot: Secondary | ICD-10-CM

## 2016-02-22 DIAGNOSIS — R7401 Elevation of levels of liver transaminase levels: Secondary | ICD-10-CM

## 2016-02-22 DIAGNOSIS — E11618 Type 2 diabetes mellitus with other diabetic arthropathy: Secondary | ICD-10-CM | POA: Diagnosis not present

## 2016-02-22 LAB — GLUCOSE, POCT (MANUAL RESULT ENTRY): POC Glucose: 100 mg/dl — AB (ref 70–99)

## 2016-02-22 LAB — POCT GLYCOSYLATED HEMOGLOBIN (HGB A1C): Hemoglobin A1C: 8.5

## 2016-02-22 MED ORDER — CLOPIDOGREL BISULFATE 75 MG PO TABS: 75.0000 mg | ORAL_TABLET | Freq: Every day | ORAL | Status: DC

## 2016-02-22 MED ORDER — GLIMEPIRIDE 4 MG PO TABS: ORAL_TABLET | ORAL | Status: DC

## 2016-02-22 MED ORDER — METFORMIN HCL 1000 MG PO TABS: 1000.0000 mg | ORAL_TABLET | Freq: Two times a day (BID) | ORAL | Status: DC

## 2016-02-22 MED ORDER — ONDANSETRON 4 MG PO TBDP: 4.0000 mg | ORAL_TABLET | Freq: Three times a day (TID) | ORAL | Status: DC | PRN

## 2016-02-22 NOTE — Patient Instructions (Addendum)
IF you received an x-ray today, you will receive an invoice from [facility]. Please contact [facility] at [phone] with questions or concerns regarding your invoice.   IF you received labwork today, you will receive an invoice from [employer]. Please contact [facility] at [phone] with questions or concerns regarding your invoice.   Our billing staff will not be able to assist you with questions regarding bills from these companies.  You will be contacted with the lab results as soon as they are available. The fastest way to get your results is to activate your My Chart account. Instructions are located on the last page of this paperwork. If you have not heard from [place] regarding the results in 2 weeks, please contact this office.     Tips for Eating Away From Home If You Have Diabetes Controlling your level of blood glucose, also known as blood sugar, can be challenging. It can be even more difficult when you do not prepare your own meals. The following tips can help you manage your diabetes when you eat away from home. PLANNING AHEAD Plan ahead if you know you will be eating away from home:  Ask your health care provider how to time meals and medicine if you are taking insulin.  Make a list of restaurants near you that offer healthy choices. If they have a carry-out menu, take it home and plan what you will order ahead of time.  Look up the restaurant you want to eat at online. Many chain and fast-food restaurants list nutritional information online. Use this information to choose the healthiest options and to calculate how many carbohydrates will be in your meal.  Use a carbohydrate-counting book or mobile app to look up the carbohydrate content and serving size of the foods you want to eat.  Become familiar with serving sizes and learn to recognize how many servings are in a portion. This will allow you to estimate how many  carbohydrates you can eat. FREE FOODS A "free food" is any food or drink that has less than 5 g of carbohydrates per serving. Free foods include:  Many vegetables.  Hard boiled eggs.  Nuts or seeds.  Olives.  Cheeses.  Meats. These types of foods make good appetizer choices and are often available at salad bars. Lemon juice, vinegar, or a low-calorie salad dressing of fewer than 20 calories per serving can be used as a "free" salad dressing.  CHOICES TO REDUCE CARBOHYDRATES  Substitute nonfat sweetened yogurt with a sugar-free yogurt. Yogurt made from soy milk may also be used, but you will still want a sugar-free or plain option to choose a lower carbohydrate amount.  Ask your server to take away the bread basket or chips from your table.  Order fresh fruit. A salad bar often offers fresh fruit choices. Avoid canned fruit because it is usually packed in sugar or syrup.  Order a salad, and eat it without dressing. Or, create a "free" salad dressing.  Ask for substitutions. For example, instead of [place] fries, request an order of a vegetable such as salad, green beans, or broccoli. OTHER TIPS   If you take insulin, take the insulin once your food arrives to your table. This will ensure your insulin and food are timed correctly.  Ask your server about the portion size before your order, and ask for a take-out box if the portion has more servings than you should have. When your food comes, leave the  amount you should have on the plate, and put the rest in the take-out box.  Consider splitting an entree with someone and ordering a side salad.   This information is not intended to replace advice given to you by your health care provider. Make sure you discuss any questions you have with your health care provider.   Document Released: 09/08/2005 Document Revised: 05/30/2015 Document Reviewed: 12/06/2013 Elsevier Interactive Patient Education [employer].

## 2016-02-22 NOTE — Progress Notes (Signed)
By signing my name below I, Tereasa Coop, attest that this documentation has been prepared under the direction and in the presence of [name], MD. Electonically Signed. Tereasa Coop, Scribe 02/22/2016 at 6:18 PM   Subjective:    Patient ID: Kirk Oneal, male    DOB: 04-Mar-1951, 65 y.o.   MRN: [id number]  Chief Complaint  Patient presents with  . Medication Refill    All that are listed     HPI Kirk Oneal is a 65 y.o. male who presents to the [facility] for follow up visit to have medication prescription refilled. Pt was recently evaluated for DM. Pt reports taking his medications faithfully. Pt starts feeling nervous when his blood sugar dips below 100. Pt states his sugar is usually measured in the 140s. Pt has been walking once a week for exercise. Pt is eating appropriately during the week. Pt drinks wine and beer on the weekend.  Pt is c/o rt distal, dorsum foot pain that is an ache and is improved with walking. Pain does not interfere with his sleep.   Pt is on plavix due to vertebral artery problem.   Pt uses his CPAP machine faithfully.   Pt denies history of taking cholesterol medication.  Pt requests PSA test.    Patient Active Problem List   Diagnosis Date Noted  . Atherosclerosis 04/17/2014  . 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 clopidogrel (PLAVIX) 75 MG tablet TAKE 1 TABLET (75 MG TOTAL) BY MOUTH DAILY. 30 tablet 0  . glimepiride (AMARYL) 4 MG tablet TAKE 1 TABLET EVERY DAY. TAKE WITH FOOD. MONITOR FOR LOW SUGARS. 30 tablet 0  . meclizine (ANTIVERT) 50 MG tablet Take 1 tablet (50 mg total) by mouth 3 (three) times daily as needed for dizziness. 30 tablet 0  . metFORMIN (GLUCOPHAGE) 1000 MG tablet Take 1,000 mg by mouth 2 (two) times daily with a meal.    . ondansetron (ZOFRAN ODT) 4 MG disintegrating tablet Take 1 tablet (4 mg  total) by mouth every 8 (eight) hours as needed for nausea or vomiting. 20 tablet 0  . [DISCONTINUED] ezetimibe (ZETIA) 10 MG tablet Take 1 tablet (10 mg total) by mouth daily. (Patient not taking: Reported on 09/17/2015) 90 tablet 1   No current facility-administered medications on file prior to visit.    No Known Allergies  Depression screen [facility] 2/9 02/22/2016 07/10/2015 07/10/2015 01/24/2015 03/10/2014  Decreased Interest 0 0 0 1 -  Down, Depressed, Hopeless 0 0 0 1 0  PHQ - 2 Score 0 0 0 2 0       Review of Systems  Constitutional: Negative for fever.  HENT: Negative for congestion.   Eyes: Negative for visual disturbance.  Respiratory: Negative for choking.   Cardiovascular: Negative for chest pain.  Gastrointestinal: Negative for abdominal pain.  Genitourinary: Negative for dysuria.  Musculoskeletal:       Pt is positive for rt foot pain  Skin: Negative for rash.  Neurological: Negative for headaches.  Psychiatric/Behavioral: Negative for sleep disturbance.       Objective:  BP 124/88 mmHg  Pulse 82  Temp(Src) 98.1 F (36.7 C) (Oral)  Resp 16  Ht 5\' 9"  (1.753 m)  Wt 255 lb (115.667 kg)  BMI 37.64 kg/m2  SpO2 97%  Physical Exam  Constitutional: He is oriented to person, place, and time. He appears  well-developed and well-nourished. No distress.  HENT:  Head: Normocephalic and atraumatic.  Eyes: Conjunctivae are normal. Pupils are equal, round, and reactive to light.  Neck: Neck supple. No thyromegaly present.  Cardiovascular: Normal rate, regular rhythm, S1 normal, S2 normal and normal heart sounds.  Exam reveals no gallop and no friction rub.   No murmur heard. Pulmonary/Chest: Effort normal and breath sounds normal. No respiratory distress. He has no decreased breath sounds. He has no wheezes. He has no rales.  Musculoskeletal: Normal range of motion.  Lymphadenopathy:    He has no cervical adenopathy.  Neurological: He is alert and oriented to person, place,  and time.  Skin: Skin is warm and dry.  Psychiatric: He has a normal mood and affect. His behavior is normal.  Nursing note and vitals reviewed.  Results for orders placed or performed in visit on 02/22/16  POCT glycosylated hemoglobin (Hb A1C)  Result Value Ref Range   Hemoglobin A1C 8.5   POCT glucose (manual entry)  Result Value Ref Range   POC Glucose 100 (A) 70 - 99 mg/dl          Assessment & Plan:  Pt is motivated to try diet and exercise to improve A1C and will follow up in 4 months.  1. Type 2 diabetes mellitus with other diabetic arthropathy, without long-term current use of insulin ([place])   2. Hyperlipidemia   3. 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 Transaminitis   5. Acquired right flat foot     Orders Placed This Encounter  Procedures  . Comprehensive metabolic panel  . TSH  . Microalbumin/Creatinine Ratio, Urine  . Ambulatory referral to Podiatry    Referral Priority:  Routine    Referral Type:  Consultation    Referral Reason:  Specialty Services Required    Requested Specialty:  Podiatry    Number of Visits Requested:  1  . POCT glycosylated hemoglobin (Hb A1C)  . POCT glucose (manual entry)  . HM DIABETES FOOT EXAM    Meds ordered this encounter  Medications  . metFORMIN (GLUCOPHAGE) 1000 MG tablet    Sig: Take 1 tablet (1,000 mg total) by mouth 2 (two) times daily with a meal.    Dispense:  180 tablet    Refill:  3  . glimepiride (AMARYL) 4 MG tablet    Sig: TAKE 1 TABLET EVERY DAY. TAKE WITH FOOD.    Dispense:  90 tablet    Refill:  1  . clopidogrel (PLAVIX) 75 MG tablet    Sig: Take 1 tablet (75 mg total) by mouth daily.    Dispense:  90 tablet    Refill:  1  . ondansetron (ZOFRAN ODT) 4 MG disintegrating tablet    Sig: Take 1 tablet (4 mg total) by mouth every 8 (eight) hours as needed for nausea or vomiting.    Dispense:  20 tablet    Refill:  1    I personally performed the services described in this documentation, which was  scribed in my presence. The recorded information has been reviewed and considered, and addended by me as needed.   [name], M.D.  [facility] [address] [place], [place] [address] [phone] phone [phone] fax  02/26/2016 10:40 PM

## 2016-02-23 LAB — MICROALBUMIN / CREATININE URINE RATIO
Creatinine, Urine: 204 mg/dL (ref 20–370)
Microalb Creat Ratio: 5 mcg/mg creat (ref <30)
Microalb, Ur: 1 mg/dL

## 2016-02-23 LAB — COMPREHENSIVE METABOLIC PANEL
ALK PHOS: 57 U/L (ref 40–115)
ALT: 49 U/L — AB (ref 9–46)
AST: 33 U/L (ref 10–35)
Albumin: 4.4 g/dL (ref 3.6–5.1)
BILIRUBIN TOTAL: 0.3 mg/dL (ref 0.2–1.2)
BUN: 13 mg/dL (ref 7–25)
CO2: 22 mmol/L (ref 20–31)
Calcium: 9.7 mg/dL (ref 8.6–10.3)
Chloride: 106 mmol/L (ref 98–110)
Creat: 0.93 mg/dL (ref 0.70–1.25)
GLUCOSE: 101 mg/dL — AB (ref 65–99)
Potassium: 4.3 mmol/L (ref 3.5–5.3)
SODIUM: 141 mmol/L (ref 135–146)
Total Protein: 6.8 g/dL (ref 6.1–8.1)

## 2016-02-23 LAB — TSH: TSH: 4.34 mIU/L (ref 0.40–4.50)

## 2016-03-02 ENCOUNTER — Other Ambulatory Visit: Payer: Self-pay | Admitting: Family Medicine

## 2016-03-13 ENCOUNTER — Ambulatory Visit: Payer: No Typology Code available for payment source | Admitting: Podiatry

## 2016-03-17 ENCOUNTER — Encounter: Payer: Self-pay | Admitting: Sports Medicine

## 2016-03-17 ENCOUNTER — Ambulatory Visit (INDEPENDENT_AMBULATORY_CARE_PROVIDER_SITE_OTHER): Payer: No Typology Code available for payment source | Admitting: Sports Medicine

## 2016-03-17 ENCOUNTER — Ambulatory Visit (INDEPENDENT_AMBULATORY_CARE_PROVIDER_SITE_OTHER): Payer: No Typology Code available for payment source

## 2016-03-17 DIAGNOSIS — M79671 Pain in right foot: Secondary | ICD-10-CM

## 2016-03-17 DIAGNOSIS — M2142 Flat foot [pes planus] (acquired), left foot: Secondary | ICD-10-CM

## 2016-03-17 DIAGNOSIS — E1141 Type 2 diabetes mellitus with diabetic mononeuropathy: Secondary | ICD-10-CM | POA: Diagnosis not present

## 2016-03-17 DIAGNOSIS — M19079 Primary osteoarthritis, unspecified ankle and foot: Secondary | ICD-10-CM | POA: Diagnosis not present

## 2016-03-17 DIAGNOSIS — M2141 Flat foot [pes planus] (acquired), right foot: Secondary | ICD-10-CM | POA: Diagnosis not present

## 2016-03-17 MED ORDER — CAPSAICIN-MENTHOL-METHYL SAL 0.025-1-12 % EX CREA: TOPICAL_CREAM | CUTANEOUS | Status: DC

## 2016-03-17 NOTE — Progress Notes (Signed)
Patient ID: Kirk Oneal, male   DOB: [birth year], 65 y.o.   MRN: [id number] Subjective: Kirk Oneal is a 65 y.o. male patient who presents to office for evaluation of Right and occassional left foot pain. Patient complains of progressive pain especially over the last year in the right>left foot at toes and across the top of the foot that is sharp with burning and some numbness. Patient is diabetic and sates that he was diagnosed >5 years ago, sugars are around 130. Admits to swelling sometimes at the end of the day.  Patient denies any other pedal complaints. Denies injury/trip/fall/sprain/any causative factors.   Patient Active Problem List   Diagnosis Date Noted  . Atherosclerosis 04/17/2014  . 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 clopidogrel (PLAVIX) 75 MG tablet Take 1 tablet (75 mg total) by mouth daily. 90 tablet 1  . glimepiride (AMARYL) 4 MG tablet TAKE 1 TABLET EVERY DAY. TAKE WITH FOOD. 90 tablet 1  . meclizine (ANTIVERT) 50 MG tablet Take 1 tablet (50 mg total) by mouth 3 (three) times daily as needed for dizziness. 30 tablet 0  . metFORMIN (GLUCOPHAGE) 1000 MG tablet Take 1 tablet (1,000 mg total) by mouth 2 (two) times daily with a meal. 180 tablet 3  . ondansetron (ZOFRAN ODT) 4 MG disintegrating tablet Take 1 tablet (4 mg total) by mouth every 8 (eight) hours as needed for nausea or vomiting. 20 tablet 1  . [DISCONTINUED] ezetimibe (ZETIA) 10 MG tablet Take 1 tablet (10 mg total) by mouth daily. (Patient not taking: Reported on 09/17/2015) 90 tablet 1   No current facility-administered medications on file prior to visit.    No Known Allergies  Objective:  General: Alert and oriented x3 in no acute distress  Dermatology: No open lesions bilateral lower extremities, no webspace macerations, no ecchymosis bilateral, all nails x 10 are short and asymptomatic at this  time.   Vascular: Dorsalis Pedis and Posterior Tibial pedal pulses palpable, Capillary Fill Time 3 seconds,(+) pedal hair growth bilateral, no edema bilateral lower extremities, Temperature gradient within normal limits.  Neurology: Gross sensation intact via light touch bilateral, Protective sensation intact  with Semmes Weinstein Monofilament to all pedal sites, Position sense intact, vibratory intact bilateral, Deep tendon reflexes within normal limits bilateral, No babinski sign present bilateral. (- )Tinels sign bilateral. Subjective burning R>L foot.  Musculoskeletal: No distinct area of tenderness with palpation the right or left foot, there was minimal pain with end range of dorsiflexion at right 1st MTPJ,No pain with calf compression bilateral. There is decreased ankle rom with knee extending  vs flexed resembling gastroc equnius bilateral, Subtalar joint range of motion is within normal limits, there is no 1st ray hypermobility noted bilateral, decreased 1st MPJ rom Right>Left with functional limitus/rigidus, hammertoe, and Pes planus noted on weightbearing exam. Strength within normal limits in all groups bilateral.   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[place])        Relevant  Medications    Capsaicin-Menthol-Methyl Sal (CAPSAICIN-METHYL SAL-MENTHOL) 0.025-1-12 % CREA    Osteoarthritis of ankle and foot, unspecified laterality        Pes planus of both feet            -Complete examination performed -Xrays reviewed -Discussed treatement options for likely neuritis secondary to diabetes and arthritis secondary to foot type -Rx  Capsaicin to use daily -Recommend patient to take mobic as needed for foot pain of which he already has -Recommend good supportive shoes and inserts daily -Patient to return to office in 8 weeks or sooner if condition worsens.  [name], DPM

## 2016-03-18 ENCOUNTER — Telehealth: Payer: Self-pay | Admitting: *Deleted

## 2016-03-18 NOTE — Telephone Encounter (Signed)
Pt states he didn't tell Dr. Cannon Kettle all the problems he was having yesterday, and would like another appt.  Transferred to schedulers.

## 2016-04-07 ENCOUNTER — Ambulatory Visit (INDEPENDENT_AMBULATORY_CARE_PROVIDER_SITE_OTHER): Payer: No Typology Code available for payment source | Admitting: Family Medicine

## 2016-04-07 ENCOUNTER — Ambulatory Visit (INDEPENDENT_AMBULATORY_CARE_PROVIDER_SITE_OTHER): Payer: No Typology Code available for payment source

## 2016-04-07 ENCOUNTER — Ambulatory Visit: Payer: No Typology Code available for payment source

## 2016-04-07 VITALS — BP 126/82 | HR 90 | Temp 98.2°F | Resp 18 | Ht 69.0 in | Wt 255.2 lb

## 2016-04-07 DIAGNOSIS — M79641 Pain in right hand: Secondary | ICD-10-CM | POA: Diagnosis not present

## 2016-04-07 DIAGNOSIS — M189 Osteoarthritis of first carpometacarpal joint, unspecified: Secondary | ICD-10-CM | POA: Diagnosis not present

## 2016-04-07 DIAGNOSIS — M79642 Pain in left hand: Principal | ICD-10-CM

## 2016-04-07 DIAGNOSIS — M19049 Primary osteoarthritis, unspecified hand: Secondary | ICD-10-CM

## 2016-04-07 DIAGNOSIS — M1990 Unspecified osteoarthritis, unspecified site: Secondary | ICD-10-CM | POA: Diagnosis not present

## 2016-04-07 DIAGNOSIS — M180 Bilateral primary osteoarthritis of first carpometacarpal joints: Secondary | ICD-10-CM

## 2016-04-07 NOTE — Patient Instructions (Addendum)
Consider trying high dose glucosamine-condroiton and or omega-3 supplements like fish oil.  Consider trying tumeric or curcumin.  Some products like osteobiflex are made with combinations of these. Try using to topical cream from the podiatrist and the meloxicam. Call if you need a refill on these.   IF you received an x-ray today, you will receive an invoice from [facility]. Please contact [facility]iology at [phone] with questions or concerns regarding your invoice.   IF you received labwork today, you will receive an invoice from [employer]. Please contact [facility] at [phone] with questions or concerns regarding your invoice.   Our billing staff will not be able to assist you with questions regarding bills from these companies.  You will be contacted with the lab results as soon as they are available. The fastest way to get your results is to activate your My Chart account. Instructions are located on the last page of this paperwork. If you have not heard from [place] regarding the results in 2 weeks, please contact this office.     Arthritis Arthritis is a term that is commonly used to refer to joint pain or joint disease. There are more than 100 types of arthritis. CAUSES The most common cause of this condition is wear and tear of a joint. Other causes include:  Gout.  Inflammation of a joint.  An infection of a joint.  Sprains and other injuries near the joint.  A drug reaction or allergic reaction. In some cases, the cause may not be known. SYMPTOMS The main symptom of this condition is pain in the joint with movement. Other symptoms include:  Redness, swelling, or stiffness at a joint.  Warmth coming from the joint.  Fever.  Overall feeling of illness. DIAGNOSIS This condition may be diagnosed with a physical exam and tests, including:  Blood tests.  Urine tests.  Imaging tests, such as MRI, X-rays, or a CT  scan. Sometimes, fluid is removed from a joint for testing. TREATMENT Treatment for this condition may involve:  Treatment of the cause, if it is known.  Rest.  Raising (elevating) the joint.  Applying cold or hot packs to the joint.  Medicines to improve symptoms and reduce inflammation.  Injections of a steroid such as cortisone into the joint to help reduce pain and inflammation. Depending on the cause of your arthritis, you may need to make lifestyle changes to reduce stress on your joint. These changes may include exercising more and losing weight. HOME CARE INSTRUCTIONS Medicines  Take over-the-counter and prescription medicines only as told by your health care provider.  Do not take aspirin to relieve pain if gout is suspected. Activities  Rest your joint if told by your health care provider. Rest is important when your disease is active and your joint feels painful, swollen, or stiff.  Avoid activities that make the pain worse. It is important to balance activity with rest.  Exercise your joint regularly with range-of-motion exercises as told by your health care provider. Try doing low-impact exercise, such as:  Swimming.  Water aerobics.  Biking.  Walking. Joint Care  If your joint is swollen, keep it elevated if told by your health care provider.  If your joint feels stiff in the morning, try taking a warm shower.  If directed, apply heat to the joint. If you have diabetes, do not apply heat without permission from your health care provider.  Put a towel between the joint and the hot pack or  heating pad.  Leave the heat on the area for 20-30 minutes.  If directed, apply ice to the joint:  Put ice in a plastic bag.  Place a towel between your skin and the bag.  Leave the ice on for 20 minutes, 2-3 times per day.  Keep all follow-up visits as told by your health care provider. This is important. SEEK MEDICAL CARE IF:  The pain gets worse.  You  have a fever. SEEK IMMEDIATE MEDICAL CARE IF:  You develop severe joint pain, swelling, or redness.  Many joints become painful and swollen.  You develop severe back pain.  You develop severe weakness in your leg.  You cannot control your bladder or bowels.   This information is not intended to replace advice given to you by your health care provider. Make sure you discuss any questions you have with your health care provider.   Document Released: 10/16/2004 Document Revised: 05/30/2015 Document Reviewed: 12/04/2014 Elsevier Interactive Patient Education [employer].

## 2016-04-07 NOTE — Progress Notes (Signed)
Subjective:  By signing my name below, I, Kirk Oneal, attest that this documentation has been prepared under the direction and in the presence of [name], MD. Electronically Signed: Moises Oneal, [place]. 04/07/2016 , 2:02 PM .  Patient was seen in Room 13 .   Patient ID: Kirk Oneal, male    DOB: Nov 21, 1950, 65 y.o.   MRN: [id number] Chief Complaint  Patient presents with  . Arthritis    In both thumbs x2-3 months   HPI Kirk Oneal is a 65 y.o. male who presents to [facility] complaining of joint pain with some swelling in both thumbs (located at the first [facility] joint both sides) that started about past 2-3 months ago. He mentions the pain is worse on the left than his right thumb. He also reports pain worsening when he grasps something with his thumbs. He notes some pain when he's sleeping, but more so when active during the day. He's gone to the [place] and was prescribed meloxicam qd for some mild relief. He denies having any films done at the [place]. He denies trying any topical creams. He denies redness or warmth in the affected areas. He denies any weakness. He's right hand dominant.   Patient was seen by podiatry 3 weeks ago for severe arthritis over his first MTP. He was prescribed topical capsaicin cream and to continue mobic as needed. Patient also had a sed rate that was high at 74 almost 2 years ago. 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 He has not had any other lab evaluations for rheumatic etiology.   Past Medical History  Diagnosis Date  . Diabetes mellitus without complication ([name])   . 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 Authorizing Provider  Capsaicin-Menthol-Methyl Sal (CAPSAICIN-METHYL SAL-MENTHOL) 0.025-1-12 % CREA Apply a small amount to the burning areas to both feet as need. Wear gloves and wash hands after use 03/17/16   [name], DPM  clopidogrel (PLAVIX) 75  MG tablet Take 1 tablet (75 mg total) by mouth daily. 02/22/16   [name], MD  glimepiride (AMARYL) 4 MG tablet TAKE 1 TABLET EVERY DAY. TAKE WITH FOOD. 02/22/16   [name], MD  meclizine (ANTIVERT) 50 MG tablet Take 1 tablet (50 mg total) by mouth 3 (three) times daily as needed for dizziness. 09/17/15   [name], DO  metFORMIN (GLUCOPHAGE) 1000 MG tablet Take 1 tablet (1,000 mg total) by mouth 2 (two) times daily with a meal. 02/22/16   [name], MD  ondansetron (ZOFRAN ODT) 4 MG disintegrating tablet Take 1 tablet (4 mg total) by mouth every 8 (eight) hours as needed for nausea or vomiting. 02/22/16   [name], MD   No Known Allergies  Review of Systems  Constitutional: Negative for fever, chills and fatigue.  Gastrointestinal: Negative for nausea, vomiting and diarrhea.  Musculoskeletal: Positive for arthralgias. Negative for myalgias, joint swelling and neck pain.  Skin: Negative for rash and wound.  Neurological: Negative for weakness and numbness.       Objective:   Physical Exam  Constitutional: He is oriented to person, place, and time. He appears well-developed and well-nourished. No distress.  HENT:  Head: Normocephalic and atraumatic.  Eyes: EOM are normal. Pupils are equal, round, and reactive to light.  Neck: Neck supple.  Cardiovascular: Normal rate.   Pulmonary/Chest: Effort normal. No respiratory distress.  Musculoskeletal: Normal  range of motion.  Right hand: positive pain over the radial aspect of the wrist first [place] joint, no pain over first extensor tendon, no pain over anatomic snuff box Left hand: is having increased pain over the first extensor tendon, but no pain over anatomic snuff box Full rom of the thumbs and strength 5/5 bilaterally  Neurological: He is alert and oriented to person, place, and time.  Skin: Skin is warm and dry.  Psychiatric: He has a normal mood and affect. His behavior is normal.  Nursing note and vitals reviewed.   BP 126/82  mmHg  Pulse 90  Temp(Src) 98.2 F (36.8 C) (Oral)  Resp 18  Ht '5\' 9"'$  (1.753 m)  Wt 255 lb 3.2 oz (115.758 kg)  BMI 37.67 kg/m2  SpO2 97%   First CMC joint arthritis worse on left than right.   Dg Hand 2 View Right  04/07/2016  CLINICAL DATA:  First carpometacarpal joint pain for months. No reported injury. EXAM: RIGHT HAND - 2 VIEW COMPARISON:  None. FINDINGS: Mild osteoarthritis at the first carpometacarpal joint, second and third distal interphalangeal joints and second metacarpophalangeal joint. No evidence of erosive arthropathy. No fracture, dislocation or suspicious focal osseous lesion. Well corticated bone fragment at the ulnar styloid is probably the sequela of remote injury. 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 Electronically Signed   By: [name] M.D.   On: 04/07/2016 14:10   Dg Hand 2 View Left  04/07/2016  CLINICAL DATA:  First CMC joint pain for months. EXAM: LEFT HAND - 2 VIEW COMPARISON:  None. FINDINGS: The mineralization and alignment are normal. There is no evidence of acute fracture or dislocation. There are moderate to advanced degenerative changes at the first [facility] joint with fragmented osteophytes and mild subluxation. Mild interphalangeal degenerative changes are seen. No evidence of inflammatory arthropathy. 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 Electronically Signed   By: [name] M.D.   On: 04/07/2016 14:10      Results for orders placed or performed in visit on 02/22/16  Comprehensive metabolic panel  Result Value Ref Range   Sodium 141 135 - 146 mmol/L   Potassium 4.3 3.5 - 5.3 mmol/L   Chloride 106 98 - 110 mmol/L   CO2 22 20 - 31 mmol/L   Glucose, Bld 101 (H) 65 - 99 mg/dL   BUN 13 7 - 25 mg/dL   Creat 0.93 0.70 - 1.25 mg/dL   Total Bilirubin 0.3 0.2 - 1.2 mg/dL   Alkaline Phosphatase 57 40 - 115 U/L   AST 33 10 - 35 U/L   ALT 49 (H) 9 - 46 U/L   Total Protein 6.8 6.1 - 8.1 g/dL    Albumin 4.4 3.6 - 5.1 g/dL   Calcium 9.7 8.6 - 10.3 mg/dL  TSH  Result Value Ref Range   TSH 4.34 0.40 - 4.50 mIU/L  Microalbumin/Creatinine Ratio, Urine  Result Value Ref Range   Creatinine, Urine 204 20 - 370 mg/dL   Microalb, Ur 1.0 Not estab mg/dL   Microalb Creat Ratio 5 <30 mcg/mg creat  POCT glycosylated hemoglobin (Hb A1C)  Result Value Ref Range   Hemoglobin A1C 8.5   POCT glucose (manual entry)  Result Value Ref Range   POC Glucose 100 (A) 70 - 99 mg/dl    Assessment & Plan:   1. 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 Pain in both hands   3.  Arthritis of carpometacarpal joint   Ice, try joint supps such as fish oil and/or glucosamine-condroidon. Try the top capsaicin cream rx'd by podiatry as has helped his 1st MTP OA. Call if needs refill. Cont mobic. Pt seen at [facility] for some of care. Prior elev esr of 53 sev yrs ago which was attributed to acute illness at the time but recheck today to r/o inflammatory and autoimmune etiology. If + -> refer to rheum.  If neg refer to hand surg if sxs cont to worsen.   Orders Placed This Encounter  Procedures  . DG Hand 2 View Left    Standing Status: Future     Number of Occurrences: 1     Standing Expiration Date: 04/07/2017    Order Specific Question:  Reason for Exam (SYMPTOM  OR DIAGNOSIS REQUIRED)    Answer:  first cmc joint pain for months    Order Specific Question:  Preferred imaging location?    Answer:  External  . DG Hand 2 View Right    Standing Status: Future     Number of Occurrences: 1     Standing Expiration Date: 04/07/2017    Order Specific Question:  Reason for Exam (SYMPTOM  OR DIAGNOSIS REQUIRED)    Answer:  first cmc joint pain for months    Order Specific Question:  Preferred imaging location?    Answer:  External  . Sedimentation Rate  . C-reactive protein  . Rheumatoid factor  . Cyclic Citrul Peptide Antibody, IGG  . Uric acid  . Care  order/instruction    AVS printed - let patient go!    Meds ordered this encounter  Medications  . meloxicam (MOBIC) 15 MG tablet    Sig:     I personally performed the services described in this documentation, which was scribed in my presence. The recorded information has been reviewed and considered, and addended by me as needed.   [name], M.D.  [facility] [address] [place], [place] [address] [phone] phone [phone] fax  04/07/2016 9:17 PM

## 2016-04-08 LAB — CYCLIC CITRUL PEPTIDE ANTIBODY, IGG: Cyclic Citrullin Peptide Ab: <16 Units

## 2016-04-08 LAB — RHEUMATOID FACTOR

## 2016-04-08 LAB — SEDIMENTATION RATE: Sed Rate: 1 mm/hr (ref 0–20)

## 2016-04-08 LAB — URIC ACID: URIC ACID, SERUM: 4.8 mg/dL (ref 4.0–8.0)

## 2016-04-08 LAB — C-REACTIVE PROTEIN

## 2016-04-14 ENCOUNTER — Encounter: Payer: Self-pay | Admitting: Sports Medicine

## 2016-04-14 ENCOUNTER — Ambulatory Visit (INDEPENDENT_AMBULATORY_CARE_PROVIDER_SITE_OTHER): Payer: No Typology Code available for payment source | Admitting: Sports Medicine

## 2016-04-14 ENCOUNTER — Encounter: Payer: Self-pay | Admitting: Family Medicine

## 2016-04-14 DIAGNOSIS — M79676 Pain in unspecified toe(s): Secondary | ICD-10-CM

## 2016-04-14 DIAGNOSIS — B351 Tinea unguium: Secondary | ICD-10-CM

## 2016-04-14 DIAGNOSIS — M79671 Pain in right foot: Secondary | ICD-10-CM

## 2016-04-14 DIAGNOSIS — M2142 Flat foot [pes planus] (acquired), left foot: Secondary | ICD-10-CM

## 2016-04-14 DIAGNOSIS — E1141 Type 2 diabetes mellitus with diabetic mononeuropathy: Secondary | ICD-10-CM

## 2016-04-14 DIAGNOSIS — M2141 Flat foot [pes planus] (acquired), right foot: Secondary | ICD-10-CM

## 2016-04-14 DIAGNOSIS — M19079 Primary osteoarthritis, unspecified ankle and foot: Secondary | ICD-10-CM

## 2016-04-14 MED ORDER — CAPSAICIN-MENTHOL-METHYL SAL 0.025-1-12 % EX CREA: TOPICAL_CREAM | CUTANEOUS | 2 refills | Status: DC

## 2016-04-14 NOTE — Progress Notes (Signed)
Patient ID: Kirk Oneal, male   DOB: [birth year], 66 y.o.   MRN: [id number] Subjective: Kirk Oneal is a 65 y.o. male patient who returns to office for follow up  evaluation of Right and occassional left foot pain. Patient states that the sharp, burning, and some numbness is better with topical Capsaicin would like refill. Reports exercise and weight-loss. Patient is diabetic sugars are around 115. Patient would also like nails to be trimmed. No other pedal complaints.  Patient Active Problem List   Diagnosis Date Noted  . Atherosclerosis 04/17/2014  . 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 clopidogrel (PLAVIX) 75 MG tablet Take 1 tablet (75 mg total) by mouth daily. 90 tablet 1  . glimepiride (AMARYL) 4 MG tablet TAKE 1 TABLET EVERY DAY. TAKE WITH FOOD. 90 tablet 1  . meclizine (ANTIVERT) 50 MG tablet Take 1 tablet (50 mg total) by mouth 3 (three) times daily as needed for dizziness. 30 tablet 0  . meloxicam (MOBIC) 15 MG tablet     . metFORMIN (GLUCOPHAGE) 1000 MG tablet Take 1 tablet (1,000 mg total) by mouth 2 (two) times daily with a meal. 180 tablet 3  . ondansetron (ZOFRAN ODT) 4 MG disintegrating tablet Take 1 tablet (4 mg total) by mouth every 8 (eight) hours as needed for nausea or vomiting. 20 tablet 1  . [DISCONTINUED] ezetimibe (ZETIA) 10 MG tablet Take 1 tablet (10 mg total) by mouth daily. (Patient not taking: Reported on 09/17/2015) 90 tablet 1   No current facility-administered medications on file prior to visit.     No Known Allergies  Objective:  General: Alert and oriented x3 in no acute distress  Dermatology: No open lesions bilateral lower extremities, no webspace macerations, no ecchymosis bilateral, all nails x 10 are mildly elongated, thickened, and subungal debris consistent with mycosis.  Vascular: Dorsalis Pedis and Posterior Tibial pedal pulses  palpable, Capillary Fill Time 3 seconds,(+) pedal hair growth bilateral, no edema bilateral lower extremities, Temperature gradient within normal limits.  Neurology: Gross sensation intact via light touch bilateral, Protective sensation intact with Semmes Weinstein Monofilament to all pedal sites, Position sense intact, vibratory intact bilateral, Deep tendon reflexes within normal limits bilateral, No babinski sign present bilateral. (- )Tinels sign bilateral. Subjective burning R>L foot improving in nature.  Musculoskeletal: No distinct area of tenderness with palpation the right or left foot, there was minimal pain with end range of dorsiflexion at right 1st MTPJ,No pain with calf compression bilateral. There is decreased ankle rom with knee extending  vs flexed resembling gastroc equnius bilateral, Subtalar joint range of motion is within normal limits, there is no 1st ray hypermobility noted bilateral, decreased 1st MPJ rom Right>Left with functional limitus/rigidus, hammertoe, and Pes planus noted on weightbearing exam. Strength within normal limits in all groups bilateral.   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)       Relevant Medications   Capsaicin-Menthol-Methyl Sal (CAPSAICIN-METHYL SAL-MENTHOL) 0.025-1-12 % CREA   Osteoarthritis of ankle and foot, unspecified laterality       Pes planus of both feet       Right foot pain           -Complete examination performed -Mechanically debrided  nails x 10 using sterile nipper without incident -Refilled Capsaicin to use daily for neuritis -Recommend patient tocontinue take mobic as needed for foot pain of which he already has -Recommend good supportive shoes and inserts daily -Patient to return to office in 12 weeks for diabetic foot exam/nail care or sooner if condition worsens.  [name],  DPM

## 2016-05-12 ENCOUNTER — Ambulatory Visit: Payer: No Typology Code available for payment source | Admitting: Sports Medicine

## 2016-07-15 ENCOUNTER — Encounter: Payer: Self-pay | Admitting: Sports Medicine

## 2016-07-15 ENCOUNTER — Ambulatory Visit (INDEPENDENT_AMBULATORY_CARE_PROVIDER_SITE_OTHER): Payer: No Typology Code available for payment source | Admitting: Sports Medicine

## 2016-07-15 DIAGNOSIS — M79672 Pain in left foot: Secondary | ICD-10-CM

## 2016-07-15 DIAGNOSIS — E1141 Type 2 diabetes mellitus with diabetic mononeuropathy: Secondary | ICD-10-CM

## 2016-07-15 DIAGNOSIS — M2141 Flat foot [pes planus] (acquired), right foot: Secondary | ICD-10-CM

## 2016-07-15 DIAGNOSIS — B351 Tinea unguium: Secondary | ICD-10-CM | POA: Diagnosis not present

## 2016-07-15 DIAGNOSIS — M19079 Primary osteoarthritis, unspecified ankle and foot: Secondary | ICD-10-CM

## 2016-07-15 DIAGNOSIS — M2142 Flat foot [pes planus] (acquired), left foot: Secondary | ICD-10-CM

## 2016-07-15 DIAGNOSIS — M79676 Pain in unspecified toe(s): Secondary | ICD-10-CM | POA: Diagnosis not present

## 2016-07-15 DIAGNOSIS — M2020 Hallux rigidus, unspecified foot: Secondary | ICD-10-CM

## 2016-07-15 DIAGNOSIS — M79671 Pain in right foot: Secondary | ICD-10-CM

## 2016-07-15 NOTE — Progress Notes (Signed)
Patient ID: Kirk Oneal, male   DOB: 1951/04/22, 65 y.o.   MRN: [id number] Subjective: Kirk Oneal is a 65 y.o. male patient who returns to office for follow up  evaluation of Right and occassional left foot pain. Patient states that the sharp, burning, and some numbness is better with topical Capsaicin. Reports trying exercise and weight-loss. Patient is diabetic sugars are "not on top of it" per patient. Patient would also like nails to be trimmed. No other pedal complaints.  Patient Active Problem List   Diagnosis Date Noted  . Atherosclerosis 04/17/2014  . 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 Capsaicin-Menthol-Methyl Sal (CAPSAICIN-METHYL SAL-MENTHOL) 0.025-1-12 % CREA Apply a small amount to the burning areas to both feet as need. Wear gloves and wash hands after use 55.6 g 2  . clopidogrel (PLAVIX) 75 MG tablet Take 1 tablet (75 mg total) by mouth daily. 90 tablet 1  . glimepiride (AMARYL) 4 MG tablet TAKE 1 TABLET EVERY DAY. TAKE WITH FOOD. 90 tablet 1  . meclizine (ANTIVERT) 50 MG tablet Take 1 tablet (50 mg total) by mouth 3 (three) times daily as needed for dizziness. 30 tablet 0  . meloxicam (MOBIC) 15 MG tablet     . metFORMIN (GLUCOPHAGE) 1000 MG tablet Take 1 tablet (1,000 mg total) by mouth 2 (two) times daily with a meal. 180 tablet 3  . ondansetron (ZOFRAN ODT) 4 MG disintegrating tablet Take 1 tablet (4 mg total) by mouth every 8 (eight) hours as needed for nausea or vomiting. 20 tablet 1  . [DISCONTINUED] ezetimibe (ZETIA) 10 MG tablet Take 1 tablet (10 mg total) by mouth daily. (Patient not taking: Reported on 09/17/2015) 90 tablet 1   No current facility-administered medications on file prior to visit.     No Known Allergies  Objective:  General: Alert and oriented x3 in no acute distress  Dermatology: No open lesions bilateral lower extremities, no  webspace macerations, no ecchymosis bilateral, all nails x 10 are mildly elongated, thickened, and subungal debris consistent with mycosis.  Vascular: Dorsalis Pedis and Posterior Tibial pedal pulses palpable, Capillary Fill Time 3 seconds,(+) pedal hair growth bilateral, no edema bilateral lower extremities, Temperature gradient within normal limits.  Neurology: Gross sensation intact via light touch bilateral, Protective sensation intact with Semmes Weinstein Monofilament to all pedal sites, Position sense intact, vibratory intact bilateral, Deep tendon reflexes within normal limits bilateral, No babinski sign present bilateral. (- )Tinels sign bilateral. Subjective burning R>L foot improving in nature.  Musculoskeletal: No distinct area of tenderness with palpation the right or left foot, there was minimal pain with end range of dorsiflexion at right 1st MTPJ,No pain with calf compression bilateral. There is decreased ankle rom with knee extending  vs flexed resembling gastroc equnius bilateral, Subtalar joint range of motion is within normal limits, there is no 1st ray hypermobility noted bilateral, decreased 1st MPJ rom Right>Left with functional limitus/rigidus, hammertoe, and Pes planus noted on weightbearing exam. Strength within normal limits in all groups bilateral.   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)       Osteoarthritis of ankle and foot, unspecified laterality       Hallux rigidus, unspecified laterality  Pes planus of both feet       Foot pain, bilateral         -Complete examination performed -Mechanically debrided nails x 10 using sterile nipper without incident -Continue with Capsaicin to use daily for neuritis -Recommend patient tocontinue take mobic as needed for foot pain of which he already has -[facility] completed on patient's  behalf for pes planus and arthritis -Recommend good supportive shoes and inserts daily -Patient to return to office in 12 weeks for diabetic foot exam/nail care or sooner if condition worsens.  Kirk Oneal, DPM

## 2016-07-16 ENCOUNTER — Telehealth: Payer: Self-pay | Admitting: Sports Medicine

## 2016-07-16 NOTE — Telephone Encounter (Signed)
Kirk Oneal has it

## 2016-07-16 NOTE — Telephone Encounter (Signed)
Pt called asking if his paper (DB[facility]for the [facility] was filled out yesterday. He said he was told he could pick it yesterday. I do not see it in the box.

## 2016-09-14 ENCOUNTER — Other Ambulatory Visit: Payer: Self-pay | Admitting: Family Medicine

## 2016-10-07 ENCOUNTER — Ambulatory Visit: Payer: No Typology Code available for payment source | Admitting: Sports Medicine

## 2016-10-17 ENCOUNTER — Other Ambulatory Visit: Payer: Self-pay

## 2016-10-17 ENCOUNTER — Other Ambulatory Visit: Payer: Self-pay | Admitting: Family Medicine

## 2016-10-17 MED ORDER — CLOPIDOGREL BISULFATE 75 MG PO TABS: 75.0000 mg | ORAL_TABLET | Freq: Every day | ORAL | 0 refills | Status: DC

## 2016-10-17 NOTE — Telephone Encounter (Signed)
Fax req  [facility] [address] forClopidogrel 75mg  Discussed with Chelle - she approved.  Pt just rec another refill and she talked to him about coming in for appt.  Sent

## 2016-10-17 NOTE — Telephone Encounter (Signed)
Meds ordered this encounter  Medications  . glimepiride (AMARYL) 4 MG tablet    Sig: TAKE 1 TABLET BY MOUTH EVERY DAY. TAKE WITH FOOD    Dispense:  30 tablet    Refill:  0    Patient advised of refill and need to follow-up before this supply exhausted.

## 2016-10-21 ENCOUNTER — Ambulatory Visit (INDEPENDENT_AMBULATORY_CARE_PROVIDER_SITE_OTHER): Payer: No Typology Code available for payment source | Admitting: Sports Medicine

## 2016-10-21 DIAGNOSIS — M79672 Pain in left foot: Secondary | ICD-10-CM | POA: Diagnosis not present

## 2016-10-21 DIAGNOSIS — E1141 Type 2 diabetes mellitus with diabetic mononeuropathy: Secondary | ICD-10-CM

## 2016-10-21 DIAGNOSIS — B351 Tinea unguium: Secondary | ICD-10-CM | POA: Diagnosis not present

## 2016-10-21 DIAGNOSIS — M19079 Primary osteoarthritis, unspecified ankle and foot: Secondary | ICD-10-CM

## 2016-10-21 DIAGNOSIS — M79671 Pain in right foot: Secondary | ICD-10-CM

## 2016-10-21 NOTE — Progress Notes (Signed)
Patient ID: Kirk Oneal, male   DOB: [birth year], 66 y.o.   MRN: [id number] Subjective: Kirk Oneal is a 66 y.o. male patient who returns to office for follow up  evaluation of Right and occassional left foot pain. Patient states that the sharp, burning, and some numbness is better with topical Capsaicin however states that he needs help with service connection with letter to [facility] about his environmential exposures.  Patient would also like nails to be trimmed. No other pedal complaints.  Patient Active Problem List   Diagnosis Date Noted  . Atherosclerosis 04/17/2014  . 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 Capsaicin-Menthol-Methyl Sal (CAPSAICIN-METHYL SAL-MENTHOL) 0.025-1-12 % CREA Apply a small amount to the burning areas to both feet as need. Wear gloves and wash hands after use 55.6 g 2  . clopidogrel (PLAVIX) 75 MG tablet Take 1 tablet (75 mg total) by mouth daily. 30 tablet 0  . glimepiride (AMARYL) 4 MG tablet TAKE 1 TABLET BY MOUTH EVERY DAY. TAKE WITH FOOD 30 tablet 0  . meclizine (ANTIVERT) 50 MG tablet Take 1 tablet (50 mg total) by mouth 3 (three) times daily as needed for dizziness. 30 tablet 0  . meloxicam (MOBIC) 15 MG tablet     . metFORMIN (GLUCOPHAGE) 1000 MG tablet Take 1 tablet (1,000 mg total) by mouth 2 (two) times daily with a meal. 180 tablet 3  . ondansetron (ZOFRAN ODT) 4 MG disintegrating tablet Take 1 tablet (4 mg total) by mouth every 8 (eight) hours as needed for nausea or vomiting. 20 tablet 1  . [DISCONTINUED] ezetimibe (ZETIA) 10 MG tablet Take 1 tablet (10 mg total) by mouth daily. (Patient not taking: Reported on 09/17/2015) 90 tablet 1   No current facility-administered medications on file prior to visit.     No Known Allergies  Objective:  General: Alert and oriented x3 in no acute distress  Dermatology: No open lesions bilateral lower  extremities, no webspace macerations, no ecchymosis bilateral, all nails x 10 are mildly elongated, thickened, and subungal debris consistent with mycosis.  Vascular: Dorsalis Pedis and Posterior Tibial pedal pulses palpable, Capillary Fill Time 3 seconds,(+) pedal hair growth bilateral, no edema bilateral lower extremities, Temperature gradient within normal limits.  Neurology: Gross sensation intact via light touch bilateral, Protective sensation intact with Semmes Weinstein Monofilament to all pedal sites, Position sense intact, vibratory intact bilateral, Deep tendon reflexes within normal limits bilateral, No babinski sign present bilateral. (- )Tinels sign bilateral. Subjective burning R>L foot.   Musculoskeletal: No distinct area of tenderness with palpation the right or left foot, there was minimal pain with end range of dorsiflexion at right 1st MTPJ,No pain with calf compression bilateral. There is decreased ankle rom with knee extending  vs flexed resembling gastroc equnius bilateral, Subtalar joint range of motion is within normal limits, there is no 1st ray hypermobility noted bilateral, decreased 1st MPJ rom Right>Left with functional limitus/rigidus, hammertoe, and Pes planus noted on weightbearing exam. Strength within normal limits in all groups bilateral.   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)       Osteoarthritis of ankle and foot, unspecified laterality       Foot pain, bilateral         -  Complete examination performed -Mechanically debrided nails x 10 using sterile nipper without incident -Continue with Capsaicin to use daily for neuritis -Recommend patient to continue take mobic as needed for foot pain of which he already has -[place] letter completed for neuropathy service connection; office will call patient when letter is ready for pick  up -Recommend good supportive shoes and inserts daily -Patient to return to office in 10-12 weeks for diabetic foot exam/nail care or sooner if condition worsens.  Kirk Oneal, DPM

## 2016-10-22 ENCOUNTER — Telehealth: Payer: Self-pay | Admitting: *Deleted

## 2016-10-22 NOTE — Telephone Encounter (Signed)
Dr. Cannon Kettle completed letter to [facility]. Left message on mobile phone informing pt of the letter and to call me to give instructions for pick up or delivery.left message on home phone to call me.

## 2016-10-25 ENCOUNTER — Ambulatory Visit: Payer: No Typology Code available for payment source

## 2016-11-01 ENCOUNTER — Ambulatory Visit (INDEPENDENT_AMBULATORY_CARE_PROVIDER_SITE_OTHER): Payer: No Typology Code available for payment source | Admitting: Family Medicine

## 2016-11-01 VITALS — BP 136/70 | HR 89 | Temp 97.5°F | Resp 16 | Ht 68.0 in | Wt 251.8 lb

## 2016-11-01 DIAGNOSIS — Z125 Encounter for screening for malignant neoplasm of prostate: Secondary | ICD-10-CM

## 2016-11-01 DIAGNOSIS — Z5181 Encounter for therapeutic drug level monitoring: Secondary | ICD-10-CM

## 2016-11-01 DIAGNOSIS — E119 Type 2 diabetes mellitus without complications: Secondary | ICD-10-CM | POA: Insufficient documentation

## 2016-11-01 DIAGNOSIS — E1165 Type 2 diabetes mellitus with hyperglycemia: Secondary | ICD-10-CM | POA: Diagnosis not present

## 2016-11-01 DIAGNOSIS — M791 Myalgia: Secondary | ICD-10-CM

## 2016-11-01 DIAGNOSIS — E785 Hyperlipidemia, unspecified: Secondary | ICD-10-CM

## 2016-11-01 DIAGNOSIS — M7918 Myalgia, other site: Secondary | ICD-10-CM

## 2016-11-01 DIAGNOSIS — N4289 Other specified disorders of prostate: Secondary | ICD-10-CM | POA: Diagnosis not present

## 2016-11-01 LAB — POCT GLYCOSYLATED HEMOGLOBIN (HGB A1C): Hemoglobin A1C: 7.9

## 2016-11-01 MED ORDER — GLIMEPIRIDE 4 MG PO TABS: ORAL_TABLET | ORAL | 1 refills | Status: DC

## 2016-11-01 MED ORDER — CLOPIDOGREL BISULFATE 75 MG PO TABS: 75.0000 mg | ORAL_TABLET | Freq: Every day | ORAL | 3 refills | Status: DC

## 2016-11-01 MED ORDER — MECLIZINE HCL 25 MG PO TABS: 25.0000 mg | ORAL_TABLET | Freq: Three times a day (TID) | ORAL | 0 refills | Status: DC | PRN

## 2016-11-01 MED ORDER — MELOXICAM 15 MG PO TABS: 15.0000 mg | ORAL_TABLET | Freq: Every day | ORAL | 3 refills | Status: DC | PRN

## 2016-11-01 NOTE — Progress Notes (Signed)
Subjective:  By signing my name below, I, Kirk Oneal, attest that this documentation has been prepared under the direction and in the presence of [name], MD Electronically Signed: [name], ED Scribe 11/01/2016 at 2:09 PM.   Patient ID: Kirk Oneal, male    DOB: 07-28-1951, 66 y.o.   MRN: [id number]  Chief Complaint  Patient presents with  . Diabetes  . Medication Refill    Plavix 75 mg, Anitveert 50 mg, Metformin 1000mg , Ondansetron Tab 4 mg   HPI Kirk Oneal is a 66 y.o. male who presents to Primary Care at [facility] for a diabetic check. Seen 6 months prior. Followed at the [facility] as well for medical problems. Follows with podiatry closely for a nail fungus. Hgb A1C last checked 8 months prior was 8.5. Normal UA mircoalbumin June 2017. Last lipid panel uncontrolled October 2016, LDL 100, non-HDL 131. LFTs were borderline with AST 39, ALT of 61 but had improved on recheck 6 months ago to 49, normal ALT (UL 46).   Pt admits that he does not check blood glucose regularly at home. Pt states that he is compliant with Metformin and Amaryl. He states that he has noticed that his blood glucose will occasionally drop when follows a healthy diet. He report feeling "nervous" when this happens and occasional polydipsia that does not persist. Pt denies HA, visual disturbances, urinary frequency. Pt does not want to start glimepiride at this visit. States that he started out on glyburide but it made him "nervous". Pt states that he has been walking for exercise but plans to join a gym and would rather see how this goes before starting another medication.   Vertebral Artery Pt was initially seen by Dr. Leta Baptist in September 2014. He presented with vertigo but denies any occasional vertigo since. 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 He states that he has never been on cholesterol medication.   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 He states that he had a colonoscopy done ~ 3 years ago and was told that he had a polyp exactly in the area that he has pain.   Wt Readings from Last 3 Encounters:  11/01/16 251 lb 12.8 oz (114.2 kg)  04/07/16 255 lb 3.2 oz (115.8 kg)  02/22/16 255 lb (115.7 kg)   Diabetic Foot Exam - Simple   Simple Foot Form Diabetic Foot exam was performed with the following findings:  Yes 11/01/2016  2:02 PM  Visual Inspection No deformities, no ulcerations, no other skin breakdown bilaterally:  Yes Sensation Testing Intact to touch and monofilament testing bilaterally:  Yes Pulse Check Comments Pedal pulses felt on foot exam    Past Medical History:  Diagnosis Date  . Diabetes mellitus without complication ([place])   . 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 Capsaicin-Menthol-Methyl Sal (CAPSAICIN-METHYL SAL-MENTHOL) 0.025-1-12 % CREA Apply a small amount to the burning areas to both feet as need. Wear gloves and wash hands after use 55.6 g 2  . clopidogrel (PLAVIX) 75 MG tablet Take 1 tablet (75 mg total) by mouth daily. 30 tablet 0  . glimepiride (AMARYL) 4 MG tablet TAKE 1 TABLET BY MOUTH EVERY DAY. TAKE WITH FOOD 30 tablet 0  . meclizine (ANTIVERT) 50 MG tablet Take 1 tablet (50 mg  total) by mouth 3 (three) times daily as needed for dizziness. 30 tablet 0  . meloxicam (MOBIC) 15 MG tablet     . metFORMIN (GLUCOPHAGE) 1000 MG tablet Take 1 tablet (1,000 mg total) by mouth 2 (two) times daily with a meal. 180 tablet 3  . ondansetron (ZOFRAN ODT) 4 MG disintegrating tablet Take 1 tablet (4 mg total) by mouth every 8 (eight) hours as needed for nausea or vomiting. 20 tablet 1  . [DISCONTINUED] ezetimibe (ZETIA) 10 MG tablet Take 1 tablet (10 mg total) by mouth daily. (Patient not taking: Reported on 09/17/2015) 90 tablet 1   No current  facility-administered medications on file prior to visit.    No Known Allergies   Depression screen [facility] 2/9 11/01/2016 11/01/2016 04/07/2016 02/22/2016 07/10/2015  Decreased Interest 0 0 0 0 0  Down, Depressed, Hopeless 0 0 0 0 0  PHQ - 2 Score 0 0 0 0 0    Review of Systems  Constitutional: Negative for activity change, appetite change, chills and fever.  Eyes: Negative for visual disturbance.  Endocrine: Positive for polydipsia. Negative for polyphagia and polyuria.  Genitourinary: Negative for decreased urine volume, difficulty urinating, dysuria, flank pain, frequency, hematuria and urgency.  Musculoskeletal: Positive for back pain.  Neurological: Positive for dizziness. Negative for facial asymmetry, weakness, numbness and headaches.  Psychiatric/Behavioral: Negative for dysphoric mood. The patient is not nervous/anxious.       Objective:   Physical Exam  Constitutional: He is oriented to person, place, and time. He appears well-developed and well-nourished. No distress.  HENT:  Head: Normocephalic and atraumatic.  Eyes: Conjunctivae and EOM are normal.  Neck: Neck supple. No tracheal deviation present.  Cardiovascular: Normal rate.   Pulmonary/Chest: Effort normal. No respiratory distress.  Genitourinary:  Genitourinary Comments: R lobe of prostate is mildly to moderately enlarged compared to L lobe. No tenderness. Wonder if R is slightly more firm than L. No pain with palpation of coccyx externally but pain with palpation internally.   Musculoskeletal: Normal range of motion.  Neurological: He is alert and oriented to person, place, and time.  Skin: Skin is warm and dry.  Psychiatric: He has a normal mood and affect. His behavior is normal.  Nursing note and vitals reviewed.  BP 136/70   Pulse 89   Temp 97.5 F (36.4 C) (Oral)   Resp 16   Ht 5\' 8"  (1.727 m)   Wt 251 lb 12.8 oz (114.2 kg)   SpO2 98%   BMI 38.29 kg/m     Results for orders placed or performed in visit  on 11/01/16  POCT glycosylated hemoglobin (Hb A1C)  Result Value Ref Range   Hemoglobin A1C 7.9    Assessment & Plan:   1. Type 2 diabetes mellitus with hyperglycemia, without long-term current use of insulin (HCC) - cont amaryl, start metformin.  2. Medication monitoring encounter   3. Hyperlipidemia LDL goal <70 - pt not fasting today, above goal. Start pravastatin 40  4. Screening for prostate cancer   5. Gluteal pain   6. Prostate asymmetry - noted on exam, pt asymptomatic, refer to urology for further eval   Has had elevated LFTs - ALT 49 - assume fatting liver but no liver imaging. Watch closely and cons RUQ [place].   Orders Placed This Encounter  Procedures  . Comprehensive metabolic panel  . LDL Cholesterol, Direct  . Microalbumin/Creatinine Ratio, Urine  . PSA  . LDL cholesterol, direct  . PSA  . Specimen  status report  . Ambulatory referral to Urology    Referral Priority:   Routine    Referral Type:   Consultation    Referral Reason:   Specialty Services Required    Requested Specialty:   Urology    Number of Visits Requested:   1  . POCT glycosylated hemoglobin (Hb A1C)  . HM Diabetes Foot Exam    Meds ordered this encounter  Medications  . glimepiride (AMARYL) 4 MG tablet    Sig: TAKE 1 TABLET BY MOUTH EVERY DAY. TAKE WITH FOOD    Dispense:  90 tablet    Refill:  1  . clopidogrel (PLAVIX) 75 MG tablet    Sig: Take 1 tablet (75 mg total) by mouth daily.    Dispense:  90 tablet    Refill:  3  . meloxicam (MOBIC) 15 MG tablet    Sig: Take 1 tablet (15 mg total) by mouth daily as needed for pain.    Dispense:  30 tablet    Refill:  3  . meclizine (ANTIVERT) 25 MG tablet    Sig: Take 1-2 tablets (25-50 mg total) by mouth 3 (three) times daily as needed for dizziness.    Dispense:  30 tablet    Refill:  0    I personally performed the services described in this documentation, which was scribed in my presence. The recorded information has been reviewed and  considered, and addended by me as needed.   [name], M.D.  Primary Care at [facility] [address] [place], [place] [address] [phone] phone [phone] fax  11/16/16 1:23 AM

## 2016-11-01 NOTE — Patient Instructions (Addendum)
   IF you received an x-ray today, you will receive an invoice from [facility]. Please contact  Radiology at [phone] with questions or concerns regarding your invoice.   IF you received labwork today, you will receive an invoice from [facility]. Please contact [facility] at [phone] with questions or concerns regarding your invoice.   Our billing staff will not be able to assist you with questions regarding bills from these companies.  You will be contacted with the lab results as soon as they are available. The fastest way to get your results is to activate your My Chart account. Instructions are located on the last page of this paperwork. If you have not heard from us regarding the results in 2 weeks, please contact this office.     Diabetes Mellitus and Standards of Medical Care Managing diabetes (diabetes mellitus) can be complicated. Your diabetes treatment may be managed by a team of health care providers, including:  A diet and nutrition specialist (registered dietitian).  A nurse.  A certified diabetes educator (CDE).  A diabetes specialist (endocrinologist).  An eye doctor.  A primary care provider.  A dentist. Your health care providers follow a schedule in order to help you get the best quality of care. The following schedule is a general guideline for your diabetes management plan. Your health care providers may also give you more specific instructions. HbA1c ( hemoglobin A1c) test This test provides information about blood sugar (glucose) control over the previous 2-3 months. It is used to check whether your diabetes management plan needs to be adjusted.  If you are meeting your treatment goals, this test is done at least 2 times a year.  If you are not meeting treatment goals or if your treatment goals have changed, this test is done 4 times a year. Blood pressure test  This test is done at every routine medical visit. For most people,  the goal is less than 140/90. In some cases, your goal blood pressure may be 130/80 or less. Ask your health care provider what your goal blood pressure should be. Dental and eye exams  Visit your dentist two times a year.  If you have type 1 diabetes, get an eye exam 3-5 years after you are diagnosed, and then once a year after your first exam.  If you were diagnosed with type 1 diabetes as a child, get an eye exam when you are age 10 or older and have had diabetes for 3-5 years. After the first exam, you should get an eye exam once a year.  If you have type 2 diabetes, have an eye exam as soon as you are diagnosed, and then once a year after your first exam. Foot care exam  Visual foot exams are done at every routine medical visit. The exams check for cuts, bruises, redness, blisters, sores, or other problems with the feet.  A complete foot exam is done by your health care provider once a year. This exam includes an inspection of the structure and skin of your feet, and a check of the pulses and sensation in your feet.  Type 1 diabetes: Get your first exam 3-5 years after diagnosis.  Type 2 diabetes: Get your first exam as soon as you are diagnosed.  Check your feet every day for cuts, bruises, redness, blisters, or sores. If you have any of these or other problems that are not healing, contact your health care provider. Kidney function test ( urine microalbumin)  This test   is done once a year.  Type 1 diabetes: Get your first test 5 years after diagnosis.  Type 2 diabetes: Get your first test as soon as you are diagnosed.  If you have chronic kidney disease (CKD), get a serum creatinine and estimated glomerular filtration rate (eGFR) test once a year. Lipid profile (cholesterol, HDL, LDL, triglycerides)  This test should be done when you are diagnosed with diabetes, and every 5 years after the first test. If you are on medicines to lower your cholesterol, you may need to get this  test done every year.  The goal for LDL is less than 100 mg/dL (5.5 mmol/L). If you are at high risk, the goal is less than 70 mg/dL (3.9 mmol/L).  The goal for HDL is 40 mg/dL (2.2 mmol/L) for men and 50 mg/dL(2.8 mmol/L) for women. An HDL cholesterol of 60 mg/dL (3.3 mmol/L) or higher gives some protection against heart disease.  The goal for triglycerides is less than 150 mg/dL (8.3 mmol/L). Immunizations  The yearly flu (influenza) vaccine is recommended for everyone 6 months or older who has diabetes.  The pneumonia (pneumococcal) vaccine is recommended for everyone 2 years or older who has diabetes. If you are 86 or older, you may get the pneumonia vaccine as a series of two separate shots.  The hepatitis B vaccine is recommended for adults shortly after they have been diagnosed with diabetes.  The Tdap (tetanus, diphtheria, and pertussis) vaccine should be given:  According to normal childhood vaccination schedules, for children.  Every 10 years, for adults who have diabetes.  The shingles vaccine is recommended for people who have had chicken pox and are 50 years or older. Mental and emotional health  Screening for symptoms of eating disorders, anxiety, and depression is recommended at the time of diagnosis and afterward as needed. If your screening shows that you have symptoms (you have a positive screening result), you may need further evaluation and be referred to a mental health care provider. Diabetes self-management education  Education about how to manage your diabetes is recommended at diagnosis and ongoing as needed. Treatment plan  Your treatment plan will be reviewed at every medical visit. Summary  Managing diabetes (diabetes mellitus) can be complicated. Your diabetes treatment may be managed by a team of health care providers.  Your health care providers follow a schedule in order to help you get the best quality of care.  Standards of care including having  regular physical exams, blood tests, blood pressure monitoring, immunizations, screening tests, and education about how to manage your diabetes.  Your health care providers may also give you more specific instructions based on your individual health. This information is not intended to replace advice given to you by your health care provider. Make sure you discuss any questions you have with your health care provider. Document Released: 07/06/2009 Document Revised: 06/06/2016 Document Reviewed: 06/06/2016 Elsevier Interactive Patient Education  2017 [employer].

## 2016-11-02 LAB — COMPREHENSIVE METABOLIC PANEL
ALK PHOS: 62 IU/L (ref 39–117)
ALT: 49 IU/L — ABNORMAL HIGH (ref 0–44)
AST: 32 IU/L (ref 0–40)
Albumin/Globulin Ratio: 1.9 (ref 1.2–2.2)
Albumin: 4.5 g/dL (ref 3.6–4.8)
BUN/Creatinine Ratio: 12 (ref 10–24)
BUN: 11 mg/dL (ref 8–27)
Bilirubin Total: 0.3 mg/dL (ref 0.0–1.2)
CO2: 23 mmol/L (ref 18–29)
CREATININE: 0.92 mg/dL (ref 0.76–1.27)
Calcium: 9.9 mg/dL (ref 8.6–10.2)
Chloride: 97 mmol/L (ref 96–106)
GFR calc Af Amer: 101 mL/min/{1.73_m2} (ref >59)
GFR calc non Af Amer: 88 mL/min/{1.73_m2} (ref >59)
GLUCOSE: 201 mg/dL — AB (ref 65–99)
Globulin, Total: 2.4 g/dL (ref 1.5–4.5)
Potassium: 4.3 mmol/L (ref 3.5–5.2)
SODIUM: 138 mmol/L (ref 134–144)
Total Protein: 6.9 g/dL (ref 6.0–8.5)

## 2016-11-02 LAB — MICROALBUMIN / CREATININE URINE RATIO
CREATININE, UR: 124.9 mg/dL
Microalb/Creat Ratio: <2.4 mg/g creat (ref 0.0–30.0)

## 2016-11-03 LAB — PSA

## 2016-11-03 LAB — LDL CHOLESTEROL, DIRECT

## 2016-11-04 LAB — PSA: PROSTATE SPECIFIC AG, SERUM: 1.2 ng/mL (ref 0.0–4.0)

## 2016-11-04 LAB — LDL CHOLESTEROL, DIRECT: LDL Direct: 99 mg/dL (ref 0–99)

## 2016-11-04 LAB — SPECIMEN STATUS REPORT

## 2016-11-16 MED ORDER — PRAVASTATIN SODIUM 40 MG PO TABS: 40.0000 mg | ORAL_TABLET | Freq: Every day | ORAL | 1 refills | Status: DC

## 2016-11-22 ENCOUNTER — Telehealth: Payer: Self-pay | Admitting: Family Medicine

## 2016-11-22 NOTE — Telephone Encounter (Signed)
PATIENT SAW DR. SHAW ABOUT 3 WEEKS AGO TO HAVE HIS DIABETES CHECKED. SHE DID SOME LAB WORK ON HIM. HE RECEIVED HIS RESULTS IN THE MAIL, HOWEVER, HE NEEDS SOMEONE TO GO OVER IT WITH HIM. BEST PHONE [phone] (CELL) PHARMACY CHOICE IS [facility] ON [address]. [place]

## 2016-11-24 NOTE — Telephone Encounter (Signed)
Pt advised of lab and questions answered

## 2017-01-20 ENCOUNTER — Encounter: Payer: Self-pay | Admitting: Sports Medicine

## 2017-01-20 ENCOUNTER — Ambulatory Visit (INDEPENDENT_AMBULATORY_CARE_PROVIDER_SITE_OTHER): Payer: No Typology Code available for payment source | Admitting: Sports Medicine

## 2017-01-20 DIAGNOSIS — M79672 Pain in left foot: Secondary | ICD-10-CM

## 2017-01-20 DIAGNOSIS — B351 Tinea unguium: Secondary | ICD-10-CM

## 2017-01-20 DIAGNOSIS — E1141 Type 2 diabetes mellitus with diabetic mononeuropathy: Secondary | ICD-10-CM

## 2017-01-20 DIAGNOSIS — M79671 Pain in right foot: Secondary | ICD-10-CM

## 2017-01-20 NOTE — Progress Notes (Signed)
Patient ID: Kirk Oneal, male   DOB: 08/08/1951, 66 y.o.   MRN: [id number] Subjective: Kirk Oneal is a 65 y.o. male patient who returns to office for follow up evaluation of long thick nails and for follow up of neuritis. No other pedal complaints.  Patient Active Problem List   Diagnosis Date Noted  . Diabetes mellitus without complication ([place]) 95/05/3266  . Atherosclerosis 04/17/2014  . 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 Capsaicin-Menthol-Methyl Sal (CAPSAICIN-METHYL SAL-MENTHOL) 0.025-1-12 % CREA Apply a small amount to the burning areas to both feet as need. Wear gloves and wash hands after use 55.6 g 2  . clopidogrel (PLAVIX) 75 MG tablet Take 1 tablet (75 mg total) by mouth daily. 90 tablet 3  . glimepiride (AMARYL) 4 MG tablet TAKE 1 TABLET BY MOUTH EVERY DAY. TAKE WITH FOOD 90 tablet 1  . meclizine (ANTIVERT) 25 MG tablet Take 1-2 tablets (25-50 mg total) by mouth 3 (three) times daily as needed for dizziness. 30 tablet 0  . meloxicam (MOBIC) 15 MG tablet Take 1 tablet (15 mg total) by mouth daily as needed for pain. 30 tablet 3  . metFORMIN (GLUCOPHAGE) 1000 MG tablet Take 1 tablet (1,000 mg total) by mouth 2 (two) times daily with a meal. 180 tablet 3  . ondansetron (ZOFRAN ODT) 4 MG disintegrating tablet Take 1 tablet (4 mg total) by mouth every 8 (eight) hours as needed for nausea or vomiting. 20 tablet 1  . pravastatin (PRAVACHOL) 40 MG tablet Take 1 tablet (40 mg total) by mouth daily. 90 tablet 1  . [DISCONTINUED] ezetimibe (ZETIA) 10 MG tablet Take 1 tablet (10 mg total) by mouth daily. (Patient not taking: Reported on 09/17/2015) 90 tablet 1   No current facility-administered medications on file prior to visit.     No Known Allergies  Objective:  General: Alert and oriented x3 in no acute distress  Dermatology: No open lesions bilateral lower extremities,  no webspace macerations, no ecchymosis bilateral, all nails x 10 are mildly elongated, thickened, and subungal debris consistent with mycosis.  Vascular: Dorsalis Pedis and Posterior Tibial pedal pulses palpable, Capillary Fill Time 3 seconds,(+) pedal hair growth bilateral, no edema bilateral lower extremities, Temperature gradient within normal limits.  Neurology: Gross sensation intact via light touch bilateral, Protective sensation intact with Semmes Weinstein Monofilament to all pedal sites, Position sense intact, vibratory intact bilateral, Deep tendon reflexes within normal limits bilateral, No babinski sign present bilateral. (- )Tinels sign bilateral. Subjective burning R>L foot.   Musculoskeletal: No distinct area of tenderness with palpation the right or left foot, No pain with calf compression bilateral, decreased 1st MPJ rom Right>Left with functional limitus/rigidus, hammertoe, and Pes planus noted on weightbearing exam. Strength within normal limits in all groups bilateral.   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  [name], DPM

## 2017-01-22 IMAGING — DX DG HAND 2V*R*
2 series · 2 of 2 positions shown · non-contrast
Comparison: None.

CLINICAL DATA: First carpometacarpal joint pain for months. No
reported injury.

EXAM:
RIGHT HAND - 2 VIEW

[hand pa]
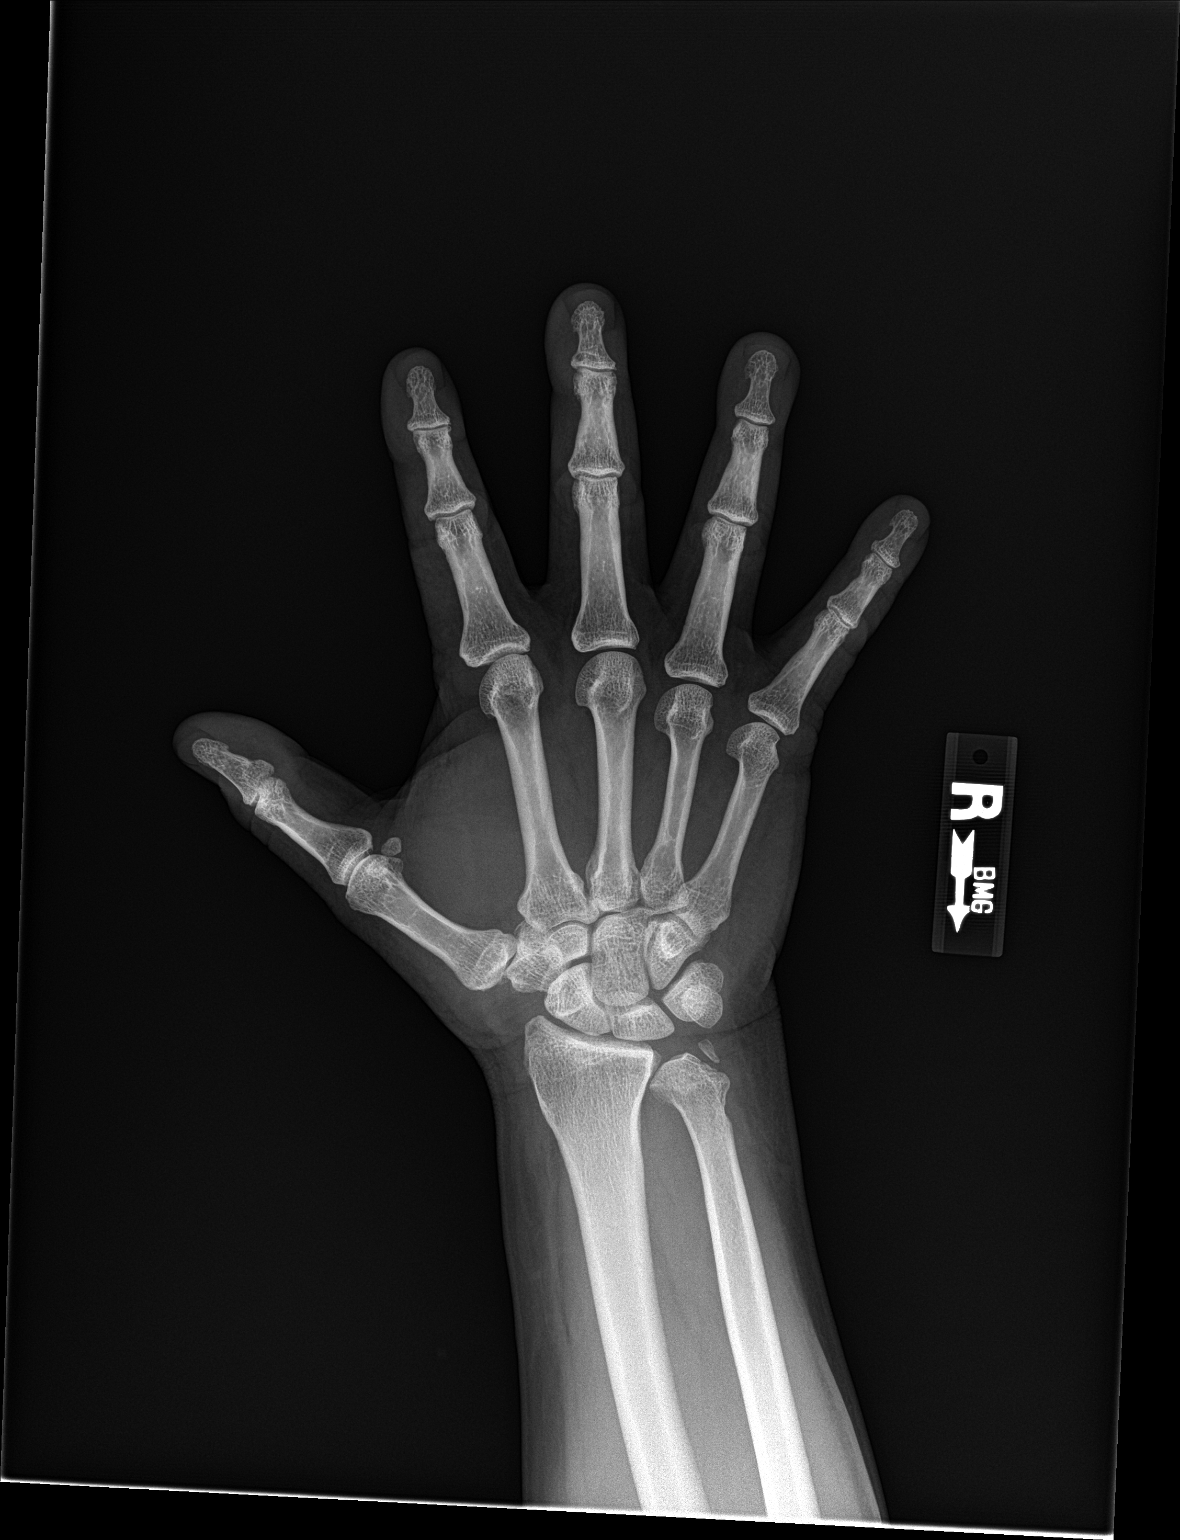

[hand lat]
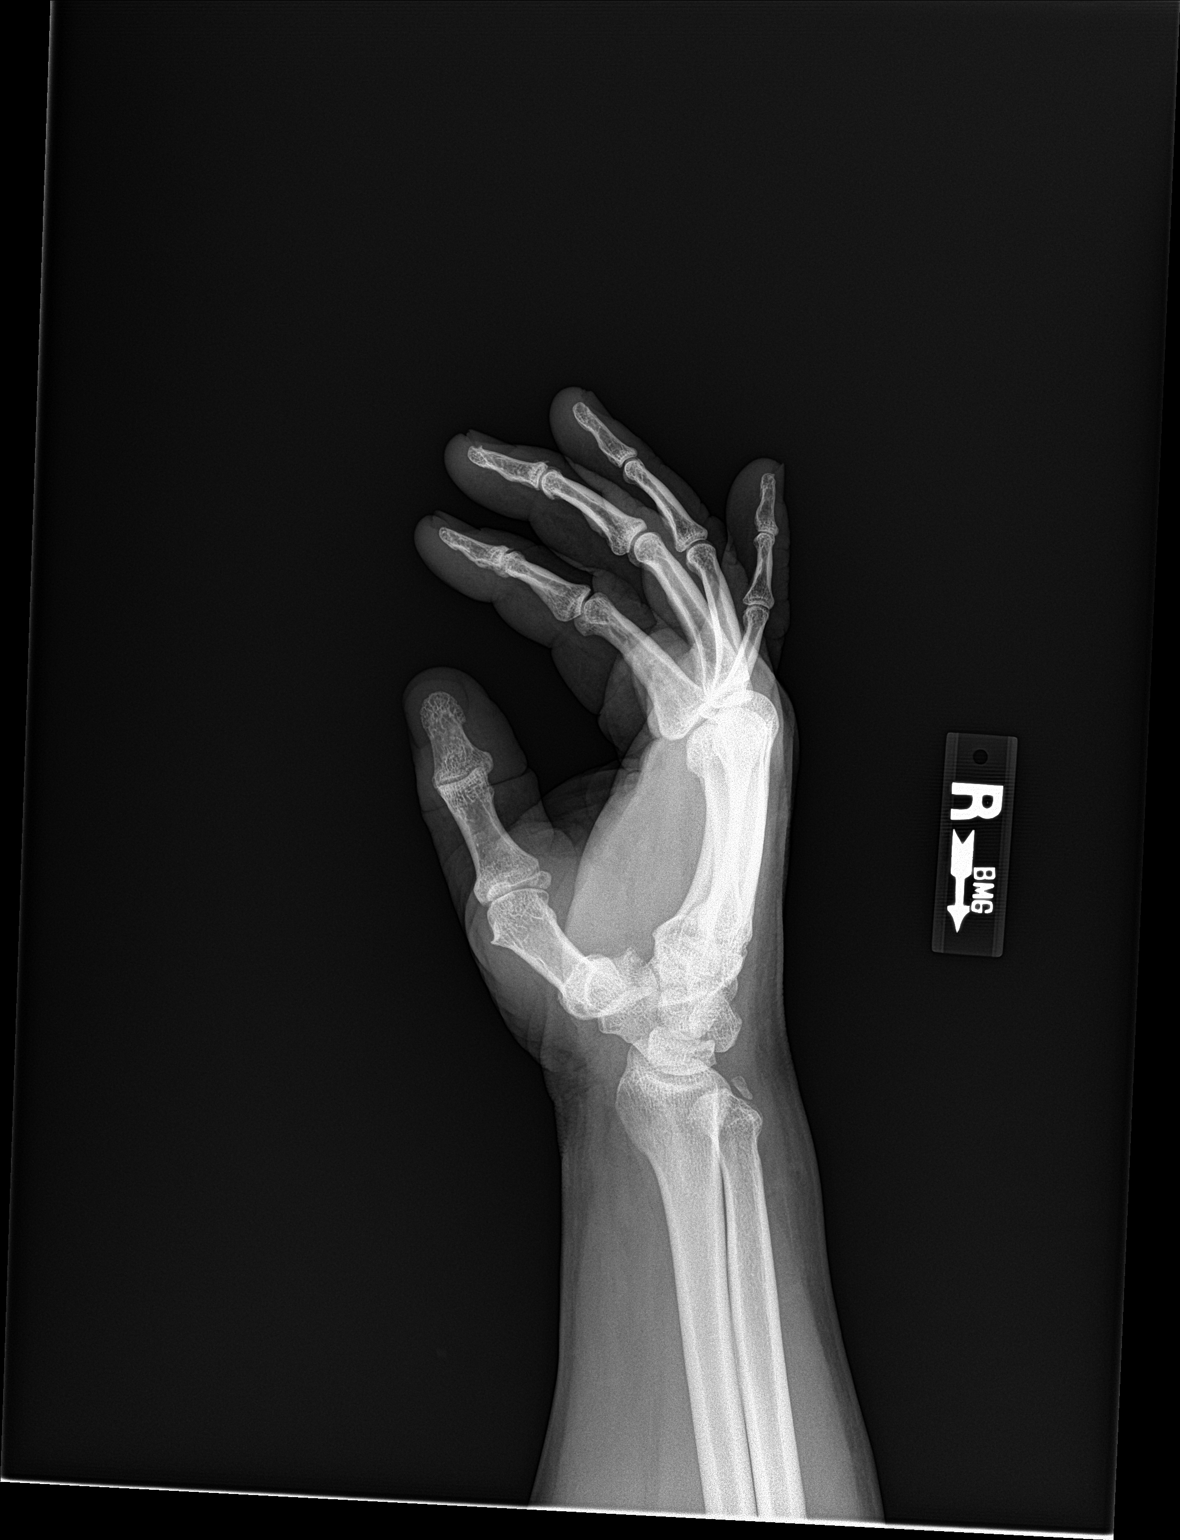

[2 of 2 positions shown; findings below may reference images not displayed]

FINDINGS: Mild osteoarthritis at the first carpometacarpal joint, second and
third distal interphalangeal joints and second metacarpophalangeal
joint. No evidence of erosive arthropathy. No fracture, dislocation
or suspicious focal osseous lesion. Well corticated bone fragment at
the ulnar styloid is probably the sequela of remote injury.
IMPRESSION: Mild polyarticular osteoarthritis in the right hand as described.

## 2017-01-22 IMAGING — DX DG HAND 2V*L*
2 series · 2 of 2 positions shown · non-contrast
Comparison: None.

CLINICAL DATA: First CMC joint pain for months.

EXAM:
LEFT HAND - 2 VIEW

[hand pa]
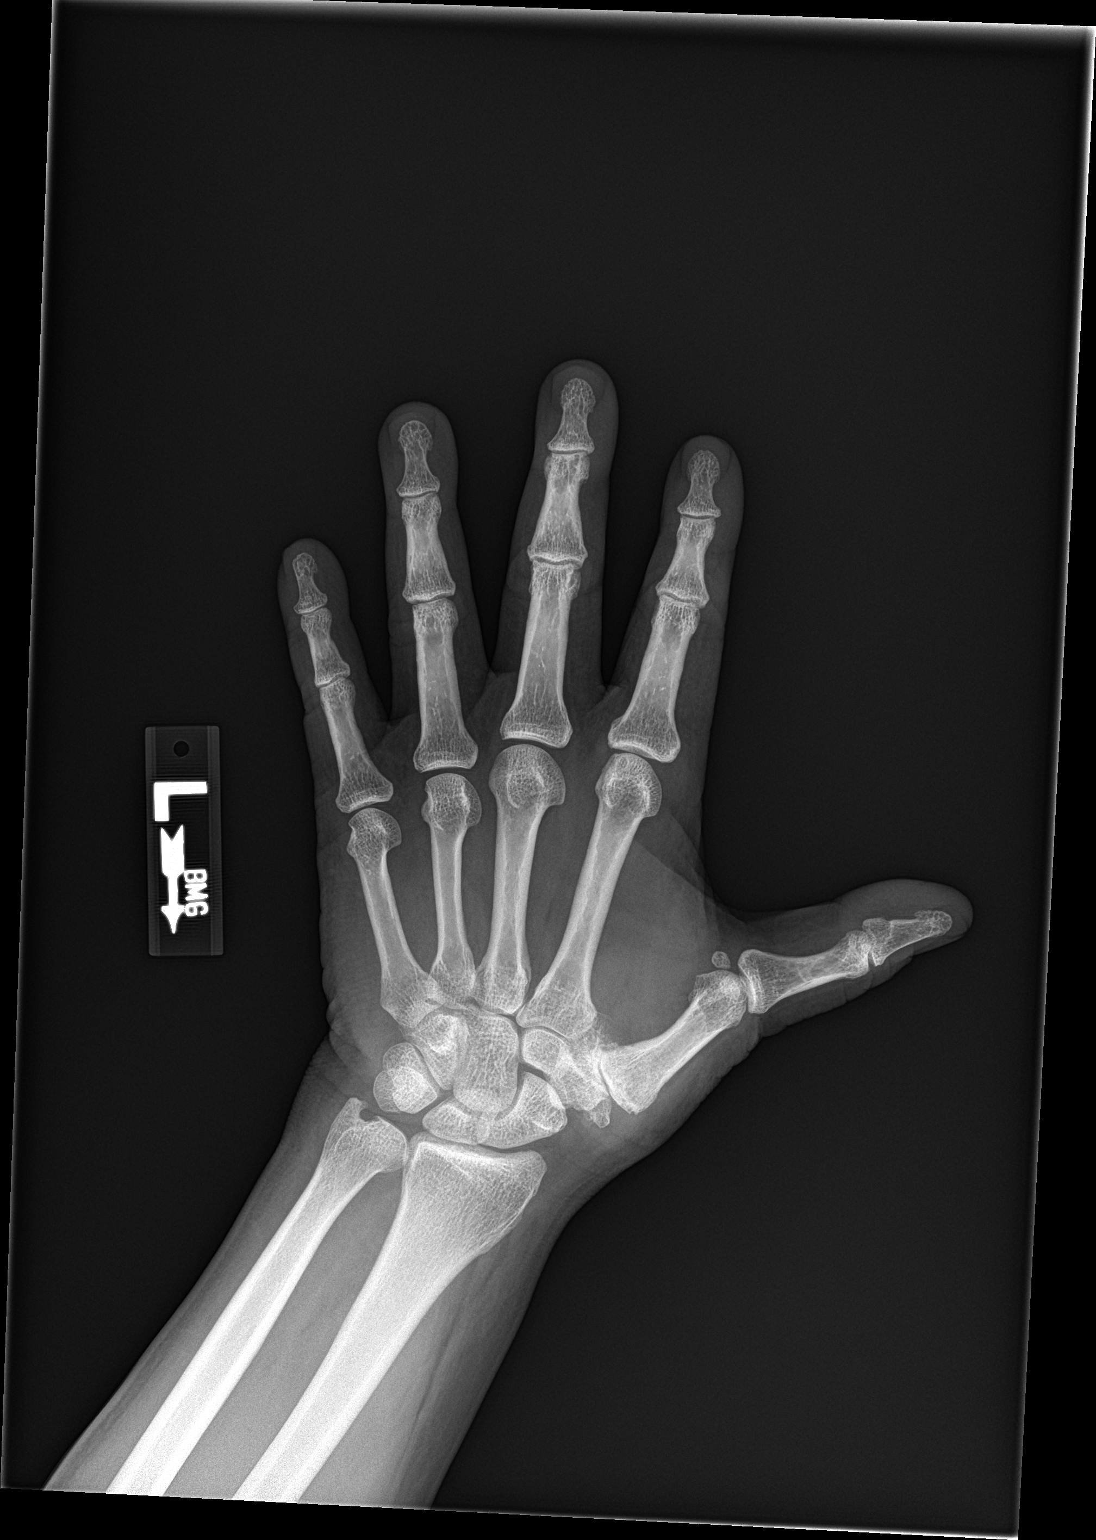

[hand lat]
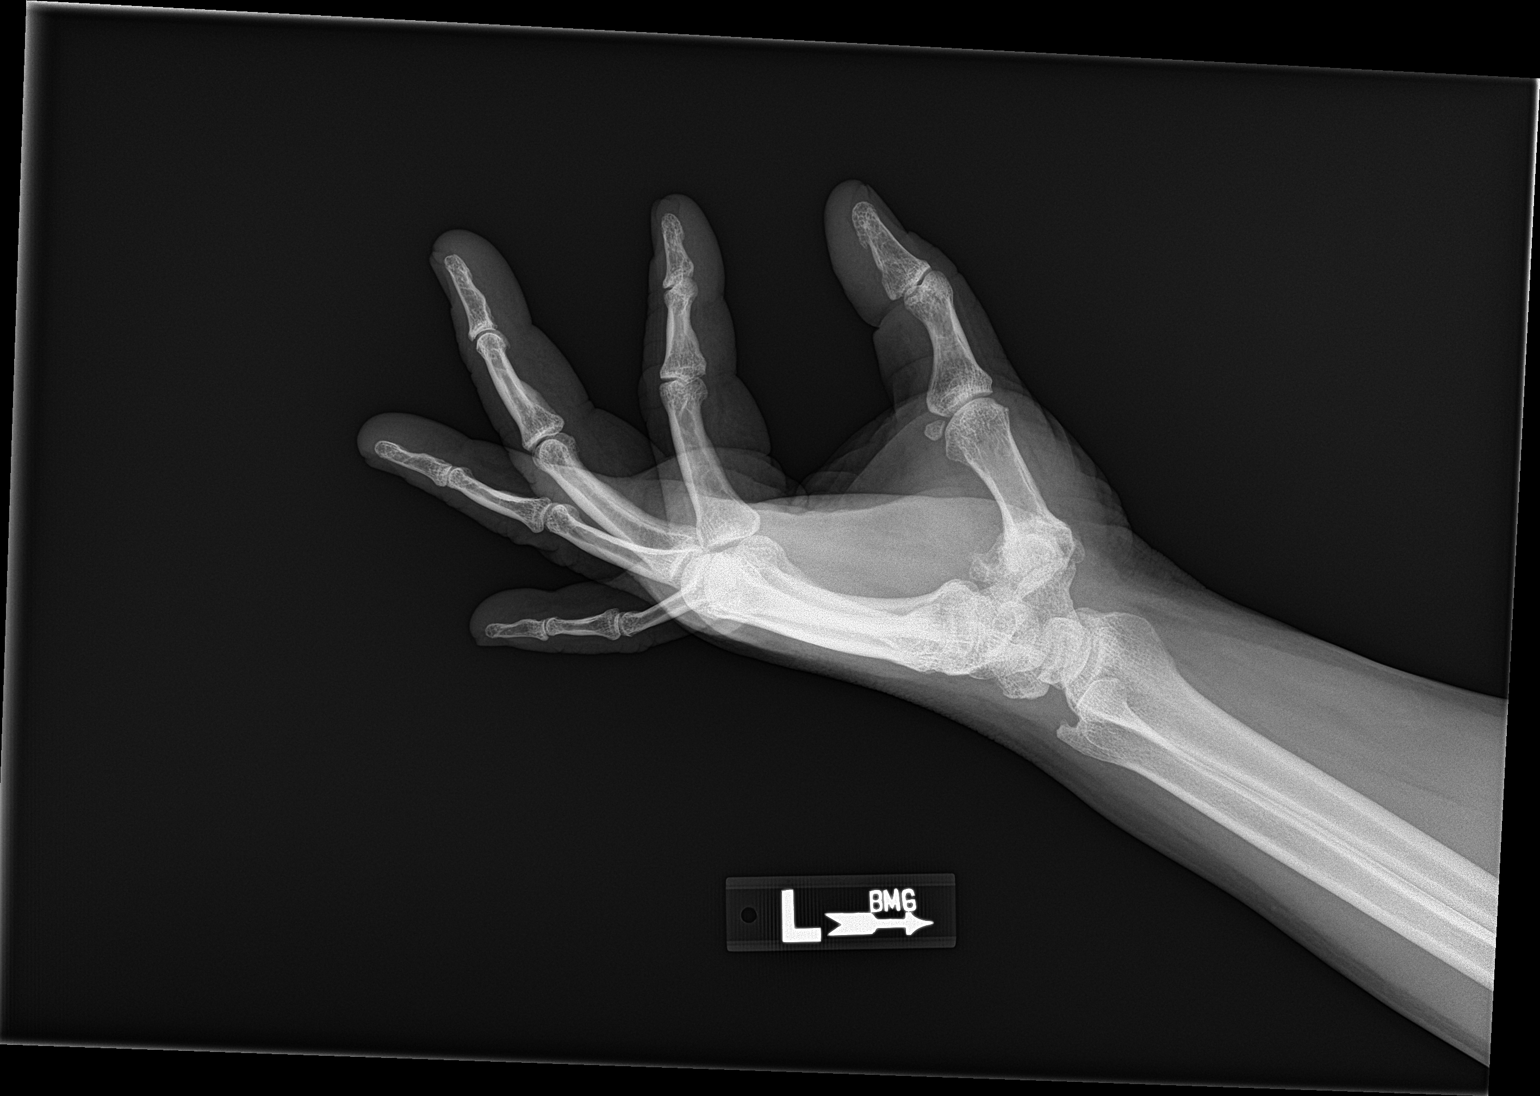

[2 of 2 positions shown; findings below may reference images not displayed]

FINDINGS: The mineralization and alignment are normal. There is no evidence of
acute fracture or dislocation. There are moderate to advanced
degenerative changes at the first CMC joint with fragmented
osteophytes and mild subluxation. Mild interphalangeal degenerative
changes are seen. No evidence of inflammatory arthropathy.
IMPRESSION: Moderate to advanced first CMC osteoarthritis.

## 2017-04-30 ENCOUNTER — Other Ambulatory Visit: Payer: Self-pay | Admitting: Family Medicine

## 2017-05-19 ENCOUNTER — Ambulatory Visit (INDEPENDENT_AMBULATORY_CARE_PROVIDER_SITE_OTHER): Payer: No Typology Code available for payment source | Admitting: Sports Medicine

## 2017-05-19 ENCOUNTER — Encounter: Payer: Self-pay | Admitting: Sports Medicine

## 2017-05-19 DIAGNOSIS — M79671 Pain in right foot: Secondary | ICD-10-CM

## 2017-05-19 DIAGNOSIS — B351 Tinea unguium: Secondary | ICD-10-CM

## 2017-05-19 DIAGNOSIS — E1141 Type 2 diabetes mellitus with diabetic mononeuropathy: Secondary | ICD-10-CM

## 2017-05-19 DIAGNOSIS — M79672 Pain in left foot: Secondary | ICD-10-CM

## 2017-05-19 NOTE — Progress Notes (Signed)
Patient ID: Kirk Oneal, male   DOB: 1950/11/26, 66 y.o.   MRN: [id number] Subjective: Kirk Oneal is a 66 y.o. male patient who returns to office for follow up evaluation of long thick nails and for follow up of neuritis. No other pedal complaints.  FBS this AM was 120 and last A1c was 7.   Patient Active Problem List   Diagnosis Date Noted  . Diabetes mellitus without complication ([place]) 93/81/8299  . Atherosclerosis 04/17/2014  . 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 clopidogrel (PLAVIX) 75 MG tablet Take 1 tablet (75 mg total) by mouth daily. 90 tablet 3  . glimepiride (AMARYL) 4 MG tablet TAKE 1 TABLET BY MOUTH EVERY DAY WITH FOOD 30 tablet 0  . meclizine (ANTIVERT) 25 MG tablet Take 1-2 tablets (25-50 mg total) by mouth 3 (three) times daily as needed for dizziness. 30 tablet 0  . meloxicam (MOBIC) 15 MG tablet Take 1 tablet (15 mg total) by mouth daily as needed for pain. 30 tablet 3  . metFORMIN (GLUCOPHAGE) 1000 MG tablet Take 1 tablet (1,000 mg total) by mouth 2 (two) times daily with a meal. 180 tablet 3  . ondansetron (ZOFRAN ODT) 4 MG disintegrating tablet Take 1 tablet (4 mg total) by mouth every 8 (eight) hours as needed for nausea or vomiting. 20 tablet 1  . pravastatin (PRAVACHOL) 40 MG tablet Take 1 tablet (40 mg total) by mouth daily. 90 tablet 1  . [DISCONTINUED] ezetimibe (ZETIA) 10 MG tablet Take 1 tablet (10 mg total) by mouth daily. (Patient not taking: Reported on 09/17/2015) 90 tablet 1   No current facility-administered medications on file prior to visit.     No Known Allergies  Objective:  General: Alert and oriented x3 in no acute distress  Dermatology: No open lesions bilateral lower extremities, no webspace macerations, no ecchymosis bilateral, all nails x 10 are mildly elongated, thickened, and subungal debris consistent with  mycosis.  Vascular: Dorsalis Pedis and Posterior Tibial pedal pulses palpable, Capillary Fill Time 3 seconds,(+) pedal hair growth bilateral, no edema bilateral lower extremities, Temperature gradient within normal limits.  Neurology: Gross sensation intact via light touch bilateral, Protective sensation intact with Semmes Weinstein Monofilament to all pedal sites, Position sense intact, vibratory intact bilateral, Deep tendon reflexes within normal limits bilateral, No babinski sign present bilateral. (- )Tinels sign bilateral. Subjective burning R>L foot.   Musculoskeletal: No distinct area of tenderness with palpation the right or left foot, No pain with calf compression bilateral, decreased 1st MPJ rom Right>Left with functional limitus/rigidus, hammertoe, and Pes planus noted on weightbearing exam. Strength within normal limits in all groups bilateral.   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  [name], DPM

## 2017-05-31 ENCOUNTER — Other Ambulatory Visit: Payer: Self-pay | Admitting: Family Medicine

## 2017-07-05 ENCOUNTER — Other Ambulatory Visit: Payer: Self-pay | Admitting: Family Medicine

## 2017-07-19 DIAGNOSIS — E669 Obesity, unspecified: Secondary | ICD-10-CM | POA: Insufficient documentation

## 2017-07-19 DIAGNOSIS — G4733 Obstructive sleep apnea (adult) (pediatric): Secondary | ICD-10-CM | POA: Insufficient documentation

## 2017-07-19 DIAGNOSIS — E785 Hyperlipidemia, unspecified: Secondary | ICD-10-CM | POA: Insufficient documentation

## 2017-07-19 NOTE — Progress Notes (Signed)
Subjective:    Patient ID: Kirk Oneal, male    DOB: 02/09/1951, 66 y.o.   MRN: [id number] Chief Complaint  Patient presents with  . Medication Refill    plavix, amaryl,antivert, zofran, wants skin tags removed from around his neck     HPI  Kirk Oneal is a delightful 66 yo male here for a 9 mo follow-up on his chronic medical problems inc DM and for med refills. Followed at the [facility] as well for medical problems.  DMII: Diagnosed .   Lab Results  Component Value Date   HGBA1C 7.9 11/01/2016   HGBA1C 8.5 02/22/2016   HGBA1C 7.5 07/10/2015   CBGs: not check cbgs routinely - about 1x/wk. During day 90-120  ; Rare hypoglycemic episodes ~1x/45mo .  Meter type:  Diet:  Exercising: walking; did he join a gym as planned but did not go DM Med Regimen: metformin 1g bid and amaryl '4mg'$  qam Prior changes: 10/2016 - started metformin 1g bid in addition to amaryl as a1c 7.9 2015 - could not tol glyburide - made him "nervous" (which is also how he describes his sxs during hypoglycemic episodes. glipizide 10 stopped 03/30/2013 by Dr. LMarin Comment  eGFR: 101 Baseline Cr: 0.92. Last checked 11/01/2016. Microalb: Normal 11/01/2016. Not on acei/arb - none rx'd prior in Epic. Lipids:  LDL -direct 99 10/2016, had pt start on pravastatin 40 after last labs 8 mos ago.  Not taking asa 81 qd as was told not to when started on plavix; on plavix 75 qd (due to h/o vertebral arterial disseciton)  Optho: sees annually Dr. MEinar Gipon [place] - last around 10/2016 Feet: Monofilament exam done 2/10/23018. Denies any no problems.  Follows closely with podiatry [facility] closely for a nail fungus.  Immunizations:  [place] [facility] [place] [place]  Pneumovax-23: * - [place] [facility] [place] not [facility] [place]ds.  HLD: Goal LDL <70. LDL-direct 99 10/2016. Last fasting panel 2016. Had pt start on pravastatin 40 after last labs 8 mos prior (but was only given a 6 mos supply). Did not come in to have lfts rechecked  2 mos pafter starting new med as requested. [facility] so he stopped it.  Was prev on zetia but otherwise denied prior trial on lipid therapy (?seems unlikely)  He is fasting today.    Transaminitis: stable, presumed secondary to fatty liver but no prior imaging in epic to confirm.  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 Last neuro visit 12/2014 - advised stable so f/u prn. 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 However, pt denies prior h/o taking any lipid lowering meds though likely in-accurate - clearly see that pt was rx'd zetia sev yr ago.   On plavix.  ASA?   Lumbago: 2/2 machinery at work. Prn meloxicam.  OSA on CPAP: Seen at [facility] last by Dr. ARexene Alberts04/2016 at which time he was persisting in sev yrs of por copmliance - avg use 39 min/d as works nights and so would nap in chair during day. He is wearing that.  Obesity:  Past Medical History:  Diagnosis Date  . [place]   . [place]   History reviewed. No pertinent surgical history. Current Outpatient Prescriptions on File Prior to  Visit  Medication Sig Dispense Refill  . meloxicam (MOBIC) 15 MG tablet Take 1 tablet (15 mg total) by mouth daily as needed for pain. 30 tablet 3  . metFORMIN (GLUCOPHAGE) 1000 MG tablet Take 1 tablet (1,000 mg total) by mouth 2 (two) times daily with a meal. 180 tablet 3  . pravastatin (PRAVACHOL) 40 MG tablet Take 1 tablet (40 mg total) by mouth daily. 90 tablet 1  . [DISCONTINUED] ezetimibe (ZETIA) 10 MG tablet Take 1 tablet (10 mg total) by mouth daily. (Patient not taking: Reported on 09/17/2015) 90 tablet 1   No current facility-administered medications on file prior to visit.    No Known Allergies Family History  Problem Relation Age of Onset  .  Stomach cancer Mother   . Rectal cancer Father    Social History   Social History  . Marital status: Married    Spouse name: sharon  . Number of children: 2  . Years of education: college   Occupational History  .  [employer]   Social History Main Topics  . Smoking status: Never Smoker  . Smokeless tobacco: Never Used  . Alcohol use 0.0 oz/week     Comment: occasional  . Drug use: No  . Sexual activity: No   Other Topics Concern  . None   Social History Narrative   Patient is right handed, resides with wife   1cup of coffee a day    Depression screen [facility] 2/9 07/20/2017 11/01/2016 11/01/2016 04/07/2016 02/22/2016  Decreased Interest 0 0 0 0 0  Down, Depressed, Hopeless 0 0 0 0 0  PHQ - 2 Score 0 0 0 0 0    Review of Systems  Constitutional: Negative for chills and fever.  Eyes: Negative for visual disturbance.  Respiratory: Negative for shortness of breath.   Cardiovascular: Negative for chest pain and leg swelling.  Neurological: Negative for dizziness, syncope, facial asymmetry, weakness, light-headedness and headaches.       Objective:   Physical Exam  Constitutional: He is oriented to person, place, and time. He appears well-developed and well-nourished. No distress.  HENT:  Head: Normocephalic and atraumatic.  Eyes: Pupils are equal, round, and reactive to light. Conjunctivae are normal. No scleral icterus.  Neck: Normal range of motion. Neck supple. No thyromegaly present.  Cardiovascular: Normal rate, regular rhythm, normal heart sounds and intact distal pulses.   Pulmonary/Chest: Effort normal and breath sounds normal. No respiratory distress.  Musculoskeletal: He exhibits no edema.  Lymphadenopathy:    He has no cervical adenopathy.  Neurological: He is alert and oriented to person, place, and time.  Skin: Skin is warm and dry. He is not diaphoretic.  Psychiatric: He has a normal mood and affect. His behavior is normal.      BP 124/76   Pulse 94    Temp 98.6 F (37 C)   Resp 16   Ht 5\' 8"  (1.727 m)   Wt 246 lb 6.4 oz (111.8 kg)   SpO2 96%   BMI 37.46 kg/m     removed 10 skin tags. Assessment & Plan:  No recent CPE - sched (have done at the [place]?); need [facility] visit?- [facility] - he has his  CPE there  A1c, cmp, lipids If lfts inc -> needs RUQ [place]. Start on acei/arb  1. Diabetes mellitus without complication ([place])   2. Dissection of vertebral artery (HCC)   3. Hyperlipidemia LDL goal <70   4. Medication monitoring encounter   5.  Transaminitis   6. Obstructive sleep apnea   7. Class 2 severe obesity due to excess calories with serious comorbidity and body mass index (BMI) of 38.0 to 38.9 in adult ([place])   8. Acquired skin tag     Orders Placed This Encounter  Procedures  . Comprehensive metabolic panel    Order Specific Question:   Has the patient fasted?    Answer:   Yes  . Lipid panel    Order Specific Question:   Has the patient fasted?    Answer:   Yes  . Lipid panel  . POCT glycosylated hemoglobin (Hb A1C)    Meds ordered this encounter  Medications  . clopidogrel (PLAVIX) 75 MG tablet    Sig: Take 1 tablet (75 mg total) by mouth daily.    Dispense:  90 tablet    Refill:  3  . glimepiride (AMARYL) 4 MG tablet    Sig: Take 1 tablet (4 mg total) by mouth daily. with food    Dispense:  90 tablet    Refill:  1  . ondansetron (ZOFRAN ODT) 4 MG disintegrating tablet    Sig: Take 1 tablet (4 mg total) by mouth every 8 (eight) hours as needed for nausea or vomiting.    Dispense:  20 tablet    Refill:  1  . meclizine (ANTIVERT) 25 MG tablet    Sig: Take 1-2 tablets (25-50 mg total) by mouth 3 (three) times daily as needed for dizziness.    Dispense:  30 tablet    Refill:  2    [name], M.D.  Primary Care at [facility] [address] [place],  [address] [phone] phone [phone] fax  07/22/17 10:09 PM

## 2017-07-20 ENCOUNTER — Encounter: Payer: Self-pay | Admitting: Family Medicine

## 2017-07-20 ENCOUNTER — Ambulatory Visit (INDEPENDENT_AMBULATORY_CARE_PROVIDER_SITE_OTHER): Payer: 59 | Admitting: Family Medicine

## 2017-07-20 VITALS — BP 124/76 | HR 94 | Temp 98.6°F | Resp 16 | Ht 68.0 in | Wt 246.4 lb

## 2017-07-20 DIAGNOSIS — E119 Type 2 diabetes mellitus without complications: Secondary | ICD-10-CM | POA: Diagnosis not present

## 2017-07-20 DIAGNOSIS — Z6838 Body mass index (BMI) 38.0-38.9, adult: Secondary | ICD-10-CM

## 2017-07-20 DIAGNOSIS — L918 Other hypertrophic disorders of the skin: Secondary | ICD-10-CM | POA: Diagnosis not present

## 2017-07-20 DIAGNOSIS — Z5181 Encounter for therapeutic drug level monitoring: Secondary | ICD-10-CM | POA: Diagnosis not present

## 2017-07-20 DIAGNOSIS — R740 Nonspecific elevation of levels of transaminase and lactic acid dehydrogenase [LDH]: Secondary | ICD-10-CM

## 2017-07-20 DIAGNOSIS — E785 Hyperlipidemia, unspecified: Secondary | ICD-10-CM

## 2017-07-20 DIAGNOSIS — G4733 Obstructive sleep apnea (adult) (pediatric): Secondary | ICD-10-CM | POA: Diagnosis not present

## 2017-07-20 DIAGNOSIS — I7774 Dissection of vertebral artery: Secondary | ICD-10-CM

## 2017-07-20 DIAGNOSIS — R7401 Elevation of levels of liver transaminase levels: Secondary | ICD-10-CM

## 2017-07-20 DIAGNOSIS — E66812 Obesity, class 2: Secondary | ICD-10-CM

## 2017-07-20 LAB — POCT GLYCOSYLATED HEMOGLOBIN (HGB A1C): HEMOGLOBIN A1C: 7.1

## 2017-07-20 MED ORDER — GLIMEPIRIDE 4 MG PO TABS: 4.0000 mg | ORAL_TABLET | Freq: Every day | ORAL | 1 refills | Status: DC

## 2017-07-20 MED ORDER — CLOPIDOGREL BISULFATE 75 MG PO TABS: 75.0000 mg | ORAL_TABLET | Freq: Every day | ORAL | 3 refills | Status: DC

## 2017-07-20 MED ORDER — ONDANSETRON 4 MG PO TBDP: 4.0000 mg | ORAL_TABLET | Freq: Three times a day (TID) | ORAL | 1 refills | Status: DC | PRN

## 2017-07-20 MED ORDER — MECLIZINE HCL 25 MG PO TABS: 25.0000 mg | ORAL_TABLET | Freq: Three times a day (TID) | ORAL | 2 refills | Status: DC | PRN

## 2017-07-20 NOTE — Patient Instructions (Addendum)
IF you received an x-ray today, you will receive an invoice from [facility]iology. Please contact [facility] at [phone] with questions or concerns regarding your invoice.   IF you received labwork today, you will receive an invoice from [facility]. Please contact [facility] at [phone] with questions or concerns regarding your invoice.   Our billing staff will not be able to assist you with questions regarding bills from these companies.  You will be contacted with the lab results as soon as they are available. The fastest way to get your results is to activate your My Chart account. Instructions are located on the last page of this paperwork. If you have not heard from [place] regarding the results in 2 weeks, please contact this office.     Exercising to Lose Weight Exercising can help you to lose weight. In order to lose weight through exercise, you need to do vigorous-intensity exercise. You can tell that you are exercising with vigorous intensity if you are breathing very hard and fast and cannot hold a conversation while exercising. Moderate-intensity exercise helps to maintain your current weight. You can tell that you are exercising at a moderate level if you have a higher heart rate and faster breathing, but you are still able to hold a conversation. How often should I exercise? Choose an activity that you enjoy and set realistic goals. Your health care provider can help you to make an activity plan that works for you. Exercise regularly as directed by your health care provider. This may include:  Doing resistance training twice each week, such as: ? Push-ups. ? Sit-ups. ? Lifting weights. ? Using resistance bands.  Doing a given intensity of exercise for a given amount of time. Choose from these options: ? 150 minutes of moderate-intensity exercise every week. ? 75 minutes of vigorous-intensity exercise every week. ? A mix of moderate-intensity and  vigorous-intensity exercise every week.  Children, pregnant women, people who are out of shape, people who are overweight, and older adults may need to consult a health care provider for individual recommendations. If you have any sort of medical condition, be sure to consult your health care provider before starting a new exercise program. What are some activities that can help me to lose weight?  Walking at a rate of at least 4.5 miles an hour.  Jogging or running at a rate of 5 miles per hour.  Biking at a rate of at least 10 miles per hour.  Lap swimming.  Roller-skating or in-line skating.  Cross-country skiing.  Vigorous competitive sports, such as football, basketball, and soccer.  Jumping rope.  Aerobic dancing. How can I be more active in my day-to-day activities?  Use the stairs instead of the elevator.  Take a walk during your lunch break.  If you drive, park your car farther away from work or school.  If you take public transportation, get off one stop early and walk the rest of the way.  Make all of your phone calls while standing up and walking around.  Get up, stretch, and walk around every 30 minutes throughout the day. What guidelines should I follow while exercising?  Do not exercise so much that you hurt yourself, feel dizzy, or get very short of breath.  Consult your health care provider prior to starting a new exercise program.  Wear comfortable clothes and shoes with good support.  Drink plenty of water while you exercise to prevent dehydration or heat stroke. Body water is lost  during exercise and must be replaced.  Work out until you breathe faster and your heart beats faster. This information is not intended to replace advice given to you by your health care provider. Make sure you discuss any questions you have with your health care provider. Document Released: 10/11/2010 Document Revised: 02/14/2016 Document Reviewed: 02/09/2014 Elsevier  Interactive Patient Education  2018 [place], Adult A skin tag (acrochordon) is a soft, extra growth of skin. Most skin tags are flesh-colored and rarely bigger than a pencil eraser. They commonly form near areas where there are folds in the skin, such as the armpit or groin. Skin tags are not dangerous, and they do not spread from person to person (are not contagious). You may have one skin tag or several. Skin tags do not require treatment. However, your health care provider may recommend removal of a skin tag if it:  Gets irritated from clothing.  Bleeds.  Is visible and unsightly.  Your health care provider can remove skin tags with a simple surgical procedure or a procedure that involves freezing the skin tag. Follow these instructions at home:  Watch for any changes in your skin tag. A normal skin tag does not require any other special care at home.  Take over-the-counter and prescription medicines only as told by your health care provider.  Keep all follow-up visits as told by your health care provider. This is important. Contact a health care provider if:  You have a skin tag that: ? Becomes painful. ? Changes color. ? Bleeds. ? Swells.  You develop more skin tags. This information is not intended to replace advice given to you by your health care provider. Make sure you discuss any questions you have with your health care provider. Document Released: 09/23/2015 Document Revised: 05/04/2016 Document Reviewed: 09/23/2015 Elsevier Interactive Patient Education  [employer].

## 2017-07-21 LAB — COMPREHENSIVE METABOLIC PANEL
ALBUMIN: 4.5 g/dL (ref 3.6–4.8)
ALK PHOS: 61 IU/L (ref 39–117)
ALT: 40 IU/L (ref 0–44)
AST: 28 IU/L (ref 0–40)
Albumin/Globulin Ratio: 1.9 (ref 1.2–2.2)
BUN/Creatinine Ratio: 9 — ABNORMAL LOW (ref 10–24)
BUN: 8 mg/dL (ref 8–27)
Bilirubin Total: 0.2 mg/dL (ref 0.0–1.2)
CHLORIDE: 105 mmol/L (ref 96–106)
CO2: 23 mmol/L (ref 20–29)
Calcium: 9.6 mg/dL (ref 8.6–10.2)
Creatinine, Ser: 0.9 mg/dL (ref 0.76–1.27)
GFR, EST AFRICAN AMERICAN: 103 mL/min/{1.73_m2} (ref >59)
GFR, EST NON AFRICAN AMERICAN: 89 mL/min/{1.73_m2} (ref >59)
GLOBULIN, TOTAL: 2.4 g/dL (ref 1.5–4.5)
GLUCOSE: 86 mg/dL (ref 65–99)
Potassium: 4.3 mmol/L (ref 3.5–5.2)
Sodium: 141 mmol/L (ref 134–144)
Total Protein: 6.9 g/dL (ref 6.0–8.5)

## 2017-07-21 LAB — LIPID PANEL
Chol/HDL Ratio: 4.2 ratio (ref 0.0–5.0)
Cholesterol, Total: 165 mg/dL (ref 100–199)
HDL: 39 mg/dL — AB (ref >39)
LDL Calculated: 96 mg/dL (ref 0–99)
Triglycerides: 149 mg/dL (ref 0–149)
VLDL CHOLESTEROL CAL: 30 mg/dL (ref 5–40)

## 2017-08-15 ENCOUNTER — Other Ambulatory Visit: Payer: Self-pay

## 2017-08-15 ENCOUNTER — Encounter: Payer: Self-pay | Admitting: Family Medicine

## 2017-08-15 ENCOUNTER — Ambulatory Visit: Payer: 59 | Admitting: Family Medicine

## 2017-08-15 VITALS — BP 140/80 | HR 99 | Temp 98.2°F | Resp 16 | Ht 69.29 in | Wt 248.0 lb

## 2017-08-15 DIAGNOSIS — G629 Polyneuropathy, unspecified: Secondary | ICD-10-CM | POA: Diagnosis not present

## 2017-08-15 DIAGNOSIS — T6591XS Toxic effect of unspecified substance, accidental (unintentional), sequela: Secondary | ICD-10-CM

## 2017-08-15 DIAGNOSIS — G4733 Obstructive sleep apnea (adult) (pediatric): Secondary | ICD-10-CM

## 2017-08-15 DIAGNOSIS — Z77098 Contact with and (suspected) exposure to other hazardous, chiefly nonmedicinal, chemicals: Secondary | ICD-10-CM

## 2017-08-15 DIAGNOSIS — R5382 Chronic fatigue, unspecified: Secondary | ICD-10-CM

## 2017-08-15 DIAGNOSIS — G4726 Circadian rhythm sleep disorder, shift work type: Secondary | ICD-10-CM | POA: Diagnosis not present

## 2017-08-15 LAB — POCT CBC
GRANULOCYTE PERCENT: 54.2 % (ref 37–80)
HCT, POC: 40.6 % — AB (ref 43.5–53.7)
Hemoglobin: 13.7 g/dL — AB (ref 14.1–18.1)
Lymph, poc: 2.3 (ref 0.6–3.4)
MCH: 31.6 pg — AB (ref 27–31.2)
MCHC: 33.7 g/dL (ref 31.8–35.4)
MCV: 93.6 fL (ref 80–97)
MID (CBC): 0.4 (ref 0–0.9)
MPV: 6.5 fL (ref 0–99.8)
PLATELET COUNT, POC: 367 10*3/uL (ref 142–424)
POC GRANULOCYTE: 3.2 (ref 2–6.9)
POC LYMPH PERCENT: 38.9 %L (ref 10–50)
POC MID %: 6.9 %M (ref 0–12)
RBC: 4.34 M/uL — AB (ref 4.69–6.13)
RDW, POC: 14.1 %
WBC: 5.9 10*3/uL (ref 4.6–10.2)

## 2017-08-15 MED ORDER — SUVOREXANT 15 MG PO TABS: 15.0000 mg | ORAL_TABLET | Freq: Every evening | ORAL | 0 refills | Status: DC | PRN

## 2017-08-15 MED ORDER — SUVOREXANT 20 MG PO TABS: 20.0000 mg | ORAL_TABLET | Freq: Every evening | ORAL | 0 refills | Status: DC | PRN

## 2017-08-15 MED ORDER — SUVOREXANT 10 MG PO TABS: 10.0000 mg | ORAL_TABLET | Freq: Every evening | ORAL | 0 refills | Status: DC | PRN

## 2017-08-15 NOTE — Progress Notes (Signed)
Subjective:  By signing my name below, I, Kirk Oneal, attest that this documentation has been prepared under the direction and in the presence of [name], MD. Electronically Signed: Moises Oneal, [place]. 08/15/2017 , 3:05 PM .  Patient was seen in Room 1 .   Patient ID: Kirk Oneal, male    DOB: 1951-05-11, 66 y.o.   MRN: [id number] Chief Complaint  Patient presents with  . Fatigue    pt states he has been having some fatigue for months    HPI Kirk Oneal is a 66 y.o. male who presents to Primary Care at [facility] complaining of fatigue ongoing for months now. Patient has a history of OSA on cpap, though last seen at [facility] by Dr. Rexene Alberts in 2016, which he was in poor compliance. He had labs done 3 weeks ago, which showed lipid panel stable from all years prior, but still elevated with goal LDL <70. His LDL was 96 with non HDL of 126. He had a normal ESR, CRP, RF and anti CCP in July 2017. His thyroid was on high end of normal range in June 2017. His last CBC was done 2 years prior, with hemoglobin in low normal, and negative viral hepatitis screening 3 years prior. He had normal Vitamin B-12 at 421 4 years ago.   Patient mentions currently on cpap, but fatigue has been worsening. He's missed about 60-70 days per year of work due to fatigue. Without cpap, he feels tired and not rested, but even with cpap, he still doesn't feel fully rested. He logs about 4 hours of cpap a day. He works from 10:00PM to 6:00AM. And then, sleeps at around 12:00PM and sleep until around 4:00-5:00PM. He would usually nap an hour before going back to work at 10:00PM; sometimes would use cpap for his naps. He's usually able to wake up on his own without an alarm. When he sits and talks to someone, he feels tired and can fall asleep. When he's sitting and watch TV, he can also fall asleep. He's able to maintain focus and stay awake while driving. He denies taking any supplements. He exercises about twice a week, and  generally feels better after walking. He notes some shortness of breath while doing yardwork. He denies trouble falling asleep, but has trouble staying asleep. He's tried sleep medication a long time ago without much relief.   Patient mentions he came into possible toxic contamination while he was in service. He was in [place] in 1983, and was informed he could have possible sleep problems and fatigue. He reports intermittent numbness and tingling, and was diagnosed with peripheral neuropathy.   He also requested referral for colonoscopy. He was seen by [facility] in the past for colonoscopy.   Past Medical History:  Diagnosis Date  . Diabetes mellitus without complication ([place])   . Dissection of vertebral artery ([place]) 05/17/2013   Prior to Admission medications   Medication Sig Start Date End Date Taking? Authorizing Provider  clopidogrel (PLAVIX) 75 MG tablet Take 1 tablet (75 mg total) by mouth daily. 07/20/17   Kirk Knapp, MD  glimepiride (AMARYL) 4 MG tablet Take 1 tablet (4 mg total) by mouth daily. with food 07/20/17   Kirk Knapp, MD  meclizine (ANTIVERT) 25 MG tablet Take 1-2 tablets (25-50 mg total) by mouth 3 (three) times daily as needed for dizziness. 07/20/17   Kirk Knapp, MD  meloxicam (MOBIC) 15 MG tablet Take 1 tablet (15 mg  total) by mouth daily as needed for pain. 11/01/16   Kirk Knapp, MD  metFORMIN (GLUCOPHAGE) 1000 MG tablet Take 1 tablet (1,000 mg total) by mouth 2 (two) times daily with a meal. 02/22/16   Kirk Knapp, MD  ondansetron (ZOFRAN ODT) 4 MG disintegrating tablet Take 1 tablet (4 mg total) by mouth every 8 (eight) hours as needed for nausea or vomiting. 07/20/17   Kirk Knapp, MD  pravastatin (PRAVACHOL) 40 MG tablet Take 1 tablet (40 mg total) by mouth daily. 11/16/16   Kirk Knapp, MD   No Known Allergies  History reviewed. No pertinent surgical history. Family History  Problem Relation Age of Onset  . Stomach cancer Mother   . Rectal cancer  Father    Social History   Socioeconomic History  . Marital status: Married    Spouse name: sharon  . Number of children: 2  . Years of education: college  . Highest education level: None  Social Needs  . Financial resource strain: None  . Food insecurity - worry: None  . Food insecurity - inability: None  . Transportation needs - medical: None  . Transportation needs - non-medical: None  Occupational History    Employer: [employer]  Tobacco Use  . Smoking status: Never Smoker  . Smokeless tobacco: Never Used  Substance and Sexual Activity  . Alcohol use: Yes    Alcohol/week: 0.0 oz    Comment: occasional  . Drug use: No  . Sexual activity: No  Other Topics Concern  . None  Social History Narrative   Patient is right handed, resides with wife   1cup of coffee a day    Depression screen [facility] 2/9 08/15/2017 07/20/2017 11/01/2016 11/01/2016 04/07/2016  Decreased Interest 0 0 0 0 0  Down, Depressed, Hopeless 0 0 0 0 0  PHQ - 2 Score 0 0 0 0 0    Review of Systems  Constitutional: Positive for fatigue. Negative for unexpected weight change.  Eyes: Negative for visual disturbance.  Respiratory: Negative for cough, chest tightness and shortness of breath.   Cardiovascular: Negative for chest pain, palpitations and leg swelling.  Gastrointestinal: Negative for abdominal pain and Oneal in stool.  Neurological: Positive for numbness. Negative for dizziness, light-headedness and headaches.       Objective:   Physical Exam  Constitutional: He is oriented to person, place, and time. He appears well-developed and well-nourished. No distress.  HENT:  Head: Normocephalic and atraumatic.  Eyes: EOM are normal. Pupils are equal, round, and reactive to light.  Neck: Neck supple.  Cardiovascular: Normal rate.  Pulmonary/Chest: Effort normal. No respiratory distress.  Musculoskeletal: Normal range of motion.  Neurological: He is alert and oriented to person, place, and time.    Skin: Skin is warm and dry.  Psychiatric: He has a normal mood and affect. His behavior is normal.  Nursing note and vitals reviewed.   BP 140/80   Pulse 99   Temp 98.2 F (36.8 C) (Oral)   Resp 16   Ht 5' 9.29" (1.76 m)   Wt 248 lb (112.5 kg)   SpO2 98%   BMI 36.32 kg/m      Assessment & Plan:   1. Chronic fatigue - suspect due to chemical exposures when he was at [facility] in the 1980s for sev mos - info scanned in. Will r/o complicating or exacerbating factors w/ labs below   2. Neuropathy   3. Obstructive sleep apnea - cont  using cpap  4. Sleep disorder, shift work - only getting 4-6 hrs/night during week nights - trial of belsomra. Failed trazodone - to much sedation.  5. Exposure to hazardous chemical   6. Toxic effect of substance, chiefly nonmedicinal, accidental or unintentional, sequela     Orders Placed This Encounter  Procedures  . Vitamin B12  . VITAMIN D 25 Hydroxy (Vit-D Deficiency, Fractures)  . Ferritin  . TSH  . TestT+TestF+SHBG  . POCT CBC    Meds ordered this encounter  Medications  . Suvorexant (BELSOMRA) 10 MG TABS    Sig: Take 10 mg by mouth at bedtime as needed.    Dispense:  10 tablet    Refill:  0  . Suvorexant (BELSOMRA) 15 MG TABS    Sig: Take 15 mg by mouth at bedtime as needed.    Dispense:  10 tablet    Refill:  0  . Suvorexant (BELSOMRA) 20 MG TABS    Sig: Take 20 mg by mouth at bedtime as needed.    Dispense:  10 tablet    Refill:  0    I personally performed the services described in this documentation, which was scribed in my presence. The recorded information has been reviewed and considered, and addended by me as needed.   [name], M.D.  Primary Care at [facility] [address] [place],  [address] [phone] phone [phone] fax  08/18/17 8:11 AM

## 2017-08-15 NOTE — Patient Instructions (Addendum)
     IF you received an x-ray today, you will receive an invoice from [facility]. Please contact [facility] at [phone] with questions or concerns regarding your invoice.   IF you received labwork today, you will receive an invoice from [facility]. Please contact [facility] at [phone] with questions or concerns regarding your invoice.   Our billing staff will not be able to assist you with questions regarding bills from these companies.  You will be contacted with the lab results as soon as they are available. The fastest way to get your results is to activate your My Chart account. Instructions are located on the last page of this paperwork. If you have not heard from us regarding the results in 2 weeks, please contact this office.      Fatigue Fatigue is feeling tired all of the time, a lack of energy, or a lack of motivation. Occasional or mild fatigue is often a normal response to activity or life in general. However, long-lasting (chronic) or extreme fatigue may indicate an underlying medical condition. Follow these instructions at home: Watch your fatigue for any changes. The following actions may help to lessen any discomfort you are feeling:  Talk to your health care provider about how much sleep you need each night. Try to get the required amount every night.  Take medicines only as directed by your health care provider.  Eat a healthy and nutritious diet. Ask your health care provider if you need help changing your diet.  Drink enough fluid to keep your urine clear or pale yellow.  Practice ways of relaxing, such as yoga, meditation, massage therapy, or acupuncture.  Exercise regularly.  Change situations that cause you stress. Try to keep your work and personal routine reasonable.  Do not abuse illegal drugs.  Limit alcohol intake to no more than 1 drink per day for nonpregnant women and 2 drinks per day for men. One drink equals 12 ounces of  beer, 5 ounces of wine, or 1 ounces of hard liquor.  Take a multivitamin, if directed by your health care provider.  Contact a health care provider if:  Your fatigue does not get better.  You have a fever.  You have unintentional weight loss or gain.  You have headaches.  You have difficulty: ? Falling asleep. ? Sleeping throughout the night.  You feel angry, guilty, anxious, or sad.  You are unable to have a bowel movement (constipation).  You skin is dry.  Your legs or another part of your body is swollen. Get help right away if:  You feel confused.  Your vision is blurry.  You feel faint or pass out.  You have a severe headache.  You have severe abdominal, pelvic, or back pain.  You have chest pain, shortness of breath, or an irregular or fast heartbeat.  You are unable to urinate or you urinate less than normal.  You develop abnormal bleeding, such as bleeding from the rectum, vagina, nose, lungs, or nipples.  You vomit blood.  You have thoughts about harming yourself or committing suicide.  You are worried that you might harm someone else. This information is not intended to replace advice given to you by your health care provider. Make sure you discuss any questions you have with your health care provider. Document Released: 07/06/2007 Document Revised: 02/14/2016 Document Reviewed: 01/10/2014 Elsevier Interactive Patient Education  2018 Elsevier Inc.  

## 2017-08-19 LAB — TESTT+TESTF+SHBG
SEX HORMONE BINDING: 42.7 nmol/L (ref 19.3–76.4)
TESTOSTERONE FREE: 8.3 pg/mL (ref 6.6–18.1)
Testosterone, total: 260.9 ng/dL — ABNORMAL LOW (ref 264.0–916.0)

## 2017-08-19 LAB — VITAMIN D 25 HYDROXY (VIT D DEFICIENCY, FRACTURES): Vit D, 25-Hydroxy: 26.1 ng/mL — ABNORMAL LOW (ref 30.0–100.0)

## 2017-08-19 LAB — VITAMIN B12: Vitamin B-12: 292 pg/mL (ref 232–1245)

## 2017-08-19 LAB — TSH: TSH: 2.74 u[IU]/mL (ref 0.450–4.500)

## 2017-08-19 LAB — FERRITIN: FERRITIN: 147 ng/mL (ref 30–400)

## 2017-08-25 ENCOUNTER — Ambulatory Visit (INDEPENDENT_AMBULATORY_CARE_PROVIDER_SITE_OTHER): Payer: 59 | Admitting: Podiatry

## 2017-08-25 ENCOUNTER — Encounter: Payer: Self-pay | Admitting: Podiatry

## 2017-08-25 DIAGNOSIS — E119 Type 2 diabetes mellitus without complications: Secondary | ICD-10-CM

## 2017-08-25 DIAGNOSIS — M79675 Pain in left toe(s): Secondary | ICD-10-CM | POA: Diagnosis not present

## 2017-08-25 DIAGNOSIS — B351 Tinea unguium: Secondary | ICD-10-CM | POA: Diagnosis not present

## 2017-08-25 DIAGNOSIS — M79674 Pain in right toe(s): Secondary | ICD-10-CM | POA: Diagnosis not present

## 2017-08-25 NOTE — Progress Notes (Signed)
Complaint:  Visit Type: Patient returns to my office for continued preventative foot care services. Complaint: Patient states" my nails have grown long and thick and become painful to walk and wear shoes" Patient has been diagnosed with DM with no foot complications. The patient presents for preventative foot care services. No changes to ROS.  Patient is taking  diabetic  medicine.  Podiatric Exam: Vascular: dorsalis pedis and posterior tibial pulses are palpable bilateral. Capillary return is immediate. Temperature gradient is WNL. Skin turgor WNL  Sensorium: Normal Semmes Weinstein monofilament test. Normal tactile sensation bilaterally. Nail Exam: Pt has thick disfigured discolored nails with subungual debris noted bilateral entire nail hallux through fifth toenails Ulcer Exam: There is no evidence of ulcer or pre-ulcerative changes or infection. Orthopedic Exam: Muscle tone and strength are WNL. No limitations in general ROM. No crepitus or effusions noted. Foot type and digits show no abnormalities. HAV  B/L. Pes planus. Skin: No Porokeratosis. No infection or ulcers  Diagnosis:  Onychomycosis, , Pain in right toe, pain in left toes  Treatment & Plan Procedures and Treatment: Consent by patient was obtained for treatment procedures.   Debridement of mycotic and hypertrophic toenails, 1 through 5 bilateral and clearing of subungual debris. No ulceration, no infection noted.  Return Visit-Office Procedure: Patient instructed to return to the office for a follow up visit 3 months for continued evaluation and treatment.    Gardiner Barefoot DPM

## 2017-09-07 DIAGNOSIS — S335XXA Sprain of ligaments of lumbar spine, initial encounter: Secondary | ICD-10-CM | POA: Diagnosis not present

## 2017-10-29 ENCOUNTER — Encounter: Payer: Self-pay | Admitting: Family Medicine

## 2017-10-29 ENCOUNTER — Other Ambulatory Visit: Payer: Self-pay

## 2017-10-29 ENCOUNTER — Ambulatory Visit: Payer: 59 | Admitting: Family Medicine

## 2017-10-29 VITALS — BP 130/86 | HR 107 | Temp 98.7°F | Resp 18 | Ht 69.9 in | Wt 247.0 lb

## 2017-10-29 DIAGNOSIS — R7989 Other specified abnormal findings of blood chemistry: Secondary | ICD-10-CM

## 2017-10-29 DIAGNOSIS — E538 Deficiency of other specified B group vitamins: Secondary | ICD-10-CM

## 2017-10-29 DIAGNOSIS — Z1212 Encounter for screening for malignant neoplasm of rectum: Secondary | ICD-10-CM | POA: Diagnosis not present

## 2017-10-29 DIAGNOSIS — E119 Type 2 diabetes mellitus without complications: Secondary | ICD-10-CM | POA: Diagnosis not present

## 2017-10-29 DIAGNOSIS — E559 Vitamin D deficiency, unspecified: Secondary | ICD-10-CM | POA: Diagnosis not present

## 2017-10-29 DIAGNOSIS — R5382 Chronic fatigue, unspecified: Secondary | ICD-10-CM

## 2017-10-29 DIAGNOSIS — Z1211 Encounter for screening for malignant neoplasm of colon: Secondary | ICD-10-CM

## 2017-10-29 MED ORDER — VITAMIN D (ERGOCALCIFEROL) 1.25 MG (50000 UNIT) PO CAPS: 50000.0000 [IU] | ORAL_CAPSULE | ORAL | 0 refills | Status: DC

## 2017-10-29 MED ORDER — CYANOCOBALAMIN 1000 MCG/ML IJ SOLN
1000.0000 ug | INTRAMUSCULAR | Status: DC
Start: 2017-10-29 — End: 2017-12-19
  Administered 2017-10-29: 1000 ug via INTRAMUSCULAR

## 2017-10-29 MED ORDER — SUVOREXANT 15 MG PO TABS: 15.0000 mg | ORAL_TABLET | Freq: Every evening | ORAL | 5 refills | Status: DC | PRN

## 2017-10-29 NOTE — Progress Notes (Signed)
Subjective:  By signing my name below, I, [name], attest that this documentation has been prepared under the direction and in the presence of [name], MD Electronically Signed: [name], ED Scribe 10/29/2017 at 4:33 PM.   Patient ID: Kirk Oneal, male    DOB: 12/10/1950, 67 y.o.   MRN: [id number]  Chief Complaint  Patient presents with  . Fatigue    Pt states fatigue is about the same since last visit. Pt states Suvorexant helped with sleep. Pt states it worried him to sleep for ten hours,  . Follow-up   HPI Kirk Oneal is a 67 y.o. male who presents to Primary Care at [facility] for f/u on fatigue. H/o sleep apnea with poor CPAP compliance as he was working during nights, napping during the day, however, he is currently using CPAP. Works from [employer] to 6AM, sleeps from noon to 4/5PM and sometimes naps for 1 hr before retuning to work at [employer]. No trouble falling asleep but does have trouble staying asleep. Possible toxic exposures. Was concerned he had possible sleep problems, fatigue, numbness/tingling/peripheral neuropathy due to toxic exposures. Brought in info about this which was scanned into the chart. Trazodone caused too much sedation so tried belsomra for daytime. Total testosterone was low but was drawn at 3PM and free wasn't within the normal range.  Pt reports fatigue has improved since last visit but he still feels sleepy while having conversations although he doesn't actually fall asleep. Reports that he is still compliant with CPAP. Denies feeling groggy or difficulty waking by his alarm for his shift with 15 mg belsomra, but states he was concerned since he slept for 10 hrs and typically only sleeps for 4 hrs. He is considering FMLA forms.  Immunization History  Administered Date(s) Administered  . Tdap 09/22/2012  Pneumonia: declines  Past Medical History:  Diagnosis Date  . Diabetes mellitus without complication ([place])   . 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 clopidogrel (PLAVIX) 75 MG tablet Take 1 tablet (75 mg total) by mouth daily. 90 tablet 3  . glimepiride (AMARYL) 4 MG tablet Take 1 tablet (4 mg total) by mouth daily. with food 90 tablet 1  . meclizine (ANTIVERT) 25 MG tablet Take 1-2 tablets (25-50 mg total) by mouth 3 (three) times daily as needed for dizziness. 30 tablet 2  . meloxicam (MOBIC) 15 MG tablet Take 1 tablet (15 mg total) by mouth daily as needed for pain. 30 tablet 3  . metFORMIN (GLUCOPHAGE) 1000 MG tablet Take 1 tablet (1,000 mg total) by mouth 2 (two) times daily with a meal. 180 tablet 3  . ondansetron (ZOFRAN ODT) 4 MG disintegrating tablet Take 1 tablet (4 mg total) by mouth every 8 (eight) hours as needed for nausea or vomiting. 20 tablet 1  . pravastatin (PRAVACHOL) 40 MG tablet Take 1 tablet (40 mg total) by mouth daily. 90 tablet 1  . Suvorexant (BELSOMRA) 10 MG TABS Take 10 mg by mouth at bedtime as needed. 10 tablet 0  . Suvorexant (BELSOMRA) 15 MG TABS Take 15 mg by mouth at bedtime as needed. 10 tablet 0  . Suvorexant (BELSOMRA) 20 MG TABS Take 20 mg by mouth at bedtime as needed. 10 tablet 0  . [DISCONTINUED] ezetimibe (ZETIA) 10 MG tablet Take 1 tablet (10 mg total) by mouth daily. (Patient not taking: Reported on 09/17/2015) 90 tablet 1  No current facility-administered medications on file prior to visit.    No Known Allergies   History reviewed. No pertinent surgical history. Family History  Problem Relation Age of Onset  . Stomach cancer Mother   . Rectal cancer Father    Social History   Socioeconomic History  . Marital status: Married    Spouse name: sharon  . Number of children: 2  . Years of education: college  . Highest education level: None  Social Needs  . Financial resource strain: None  . Food insecurity - worry: None  . Food insecurity - inability: None  . Transportation needs  - medical: None  . Transportation needs - non-medical: None  Occupational History    Employer: [employer]  Tobacco Use  . Smoking status: Never Smoker  . Smokeless tobacco: Never Used  Substance and Sexual Activity  . Alcohol use: Yes    Alcohol/week: 0.0 oz    Comment: occasional  . Drug use: No  . Sexual activity: No  Other Topics Concern  . None  Social History Narrative   Patient is right handed, resides with wife   1cup of coffee a day    Depression screen [facility] 2/9 10/29/2017 08/15/2017 07/20/2017 11/01/2016 11/01/2016  Decreased Interest 0 0 0 0 0  Down, Depressed, Hopeless 0 0 0 0 0  PHQ - 2 Score 0 0 0 0 0    Review of Systems  Constitutional: Positive for fatigue.      Objective:   Physical Exam  Constitutional: He is oriented to person, place, and time. He appears well-developed and well-nourished. No distress.  HENT:  Head: Normocephalic and atraumatic.  Eyes: Conjunctivae and EOM are normal.  Neck: Neck supple. No tracheal deviation present.  Cardiovascular: Normal rate and regular rhythm.  Pulmonary/Chest: Effort normal and breath sounds normal. No respiratory distress.  Musculoskeletal: Normal range of motion.  Neurological: He is alert and oriented to person, place, and time.  Skin: Skin is warm and dry.  Psychiatric: He has a normal mood and affect. His behavior is normal.  Nursing note and vitals reviewed.  BP 130/86 (BP Location: Left Arm, Patient Position: Sitting, Cuff Size: Normal)   Pulse (!) 107   Temp 98.7 F (37.1 C) (Oral)   Resp 18   Ht 5' 9.9" (1.775 m)   Wt 247 lb (112 kg)   SpO2 97%   BMI 35.54 kg/m     Assessment & Plan:   1. Diabetes mellitus without complication ([place])   2. Chronic fatigue - encouraged pt to continue to try the Belsomra for the next month since it will likely take quite a regular period of decent sleep for him to notice an impact on his energy levels.  3. Vitamin D deficiency - start replacement  4. Vitamin  B12 deficiency - trial of IM supplement today   5. 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  6. Screening for colorectal cancer - referral placed to [facility] for screening colonoscopy per pt request.   Recheck in 1 mo to assess reaction to B12 inj, D supp, and how he is feeling after a mo of regular sleep w/ Belsomra.  Orders Placed This Encounter  Procedures  . Microalbumin/Creatinine Ratio, Urine  . Vitamin B12  . VITAMIN D 25 Hydroxy (Vit-D Deficiency, Fractures)  . TestT+TestF+SHBG    FASTING 8 am labs    Standing Status:   Future  Standing Expiration Date:   10/29/2018  . Hemoglobin A1c  . VITAMIN D 25 Hydroxy (Vit-D Deficiency, Fractures)  . Vitamin B12  . Ambulatory referral to Gastroenterology    Referral Priority:   Routine    Referral Type:   Consultation    Referral Reason:   Specialty Services Required    Number of Visits Requested:   1    Meds ordered this encounter  Medications  . cyanocobalamin ((VITAMIN B-12)) injection 1,000 mcg  . Suvorexant (BELSOMRA) 15 MG TABS    Sig: Take 15 mg by mouth at bedtime as needed.    Dispense:  30 tablet    Refill:  5  . Vitamin D, Ergocalciferol, (DRISDOL) 50000 units CAPS capsule    Sig: Take 1 capsule (50,000 Units total) by mouth every 7 (seven) days.    Dispense:  24 capsule    Refill:  0    I personally performed the services described in this documentation, which was scribed in my presence. The recorded information has been reviewed and considered, and addended by me as needed.   [name], M.D.  Primary Care at [facility] [address] [place], [place] [address] [phone] phone [phone] fax  10/31/17 3:34 PM

## 2017-10-29 NOTE — Patient Instructions (Addendum)
Please come by the office to have your testosterone rechecked at 8 am some morning. (You can walk-in to the [facility] same-day appointment clinic for this and let the front desk that you are there for a lab-only visit and you do not need to see a provider.)  Try using the Belsomra for a month to try to fill up the backlog of sleep deprivation you have accumulated over the years.  Then recheck with me in 1 mo to see how you feel after the vitamin replacement and increased regular sleep.   IF you received an x-ray today, you will receive an invoice from [facility]. Please contact [facility] at [phone] with questions or concerns regarding your invoice.   IF you received labwork today, you will receive an invoice from [facility]. Please contact [facility] at [phone] with questions or concerns regarding your invoice.   Our billing staff will not be able to assist you with questions regarding bills from these companies.  You will be contacted with the lab results as soon as they are available. The fastest way to get your results is to activate your My Chart account. Instructions are located on the last page of this paperwork. If you have not heard from [place] regarding the results in 2 weeks, please contact this office.     Chronic Fatigue Syndrome Chronic fatigue syndrome (CFS) is a condition that causes extreme tiredness (fatigue). This fatigue does not improve with rest, and it gets worse with physical or mental activity. You may have several other symptoms along with fatigue. Symptoms may come and go, but they generally last for months. Sometimes, CFS gets better over time, but it can be a lifelong condition. There is no cure, but there are many possible treatments. You will need to work with your health care providers to find a treatment plan that works best for you. What are the causes? The cause of CFS is not known. There may be more than one cause. Possible causes  include:  An infection.  An abnormal body defense system (immune system).  Low blood pressure.  Poor diet.  Physical or emotional stress.  What increases the risk? You are more likely to develop this condition if:  You are male.  You are 48?66 years old.  You have a family history of CFS.  You live with a lot of emotional stress.  What are the signs or symptoms? The main symptom of CFS is fatigue that is severe enough to interfere with day-to-day activities. This fatigue does not get better with rest, and it gets worse with physical or mental activity. There are eight other major symptoms of CFS:  Lack of energy (malaise) that lasts more than 24 hours after physical exertion.  Sleep that does not relieve fatigue (unrefreshing sleep).  Short-term memory loss or confusion.  Joint pain without redness or swelling.  Muscle aches.  Headaches.  Painful and swollen glands (lymph nodes) in the neck or under the arms.  Sore throat.  You may also have:  Abdominal cramps, constipation, or diarrhea (irritable bowel).  Chills.  Night sweats.  Vision changes.  Dizziness.  Mental confusion (brain fog).  Clumsiness.  Sensitivity to food, noise, or odors.  Mood swings, depression, or anxiety attacks.  How is this diagnosed? There are no tests that can diagnose this condition. Your health care provider will make the diagnosis based on your medical history, a physical exam, and a mental health exam. However, it is important to make sure  that your symptoms are not caused by another medical condition. You may have lab tests or X-rays to rule out other conditions. For your health care provider to diagnose CFS:  You must have had fatigue for at least 6 straight months.  Fatigue must be your first symptom, and it must be severe enough to interfere with day-to-day activities.  There must be no other cause found for the fatigue.  You must also have at least four of the  eight other major symptoms of CFS.  How is this treated? There is no cure for CFS. The condition affects everyone differently. You will need to work with your team of health care providers to find the best treatments for your symptoms. Your team may include your primary care provider, physical and exercise therapists, and mental health therapists. Treatment may include:  Improving sleep with a regular bedtime routine.  Avoiding caffeine, alcohol, and tobacco.  Doing light exercise and stretching during the day.  Taking medicines to help you sleep or to relieve joint or muscle pain.  Learning and practicing relaxation techniques.  Using memory aids or doing brainteasers to improve memory and concentration.  Seeing a mental health therapist to evaluate and treat depression, if necessary.  Trying massage therapy, acupuncture, and movement exercises, such as yoga or tai chi.  Follow these instructions at home:  Activity  Exercise regularly, as told by your health care provider.  Avoid fatigue by pacing yourself during the day and getting enough sleep at night.  Go to bed and get up at the same time every day. Eating and drinking  Avoid caffeine and alcohol.  Avoid heavy meals in the evening.  Eat a well-balanced diet. General instructions  Take over-the-counter and prescription medicines only as told by your health care provider.  Do not use herbal or dietary supplements unless they are approved by your health care provider.  Maintain a healthy weight.  Avoid stress and use stress-reducing techniques that you learn in therapy.  Do not use any products that contain nicotine or tobacco, such as cigarettes and e-cigarettes. If you need help quitting, ask your health care provider.  Consider joining a CFS support group.  Keep all follow-up visits as told by your health care provider. This is important. Contact a health care provider if:  Your symptoms do not get better  or they get worse.  You feel angry, guilty, anxious, or depressed. This information is not intended to replace advice given to you by your health care provider. Make sure you discuss any questions you have with your health care provider. Document Released: 10/16/2004 Document Revised: 05/15/2016 Document Reviewed: 12/17/2015 Elsevier Interactive Patient Education  2018 [employer].   Testosterone Why am I having this test? Testosterone is a hormone made by the male's testicles and by the adrenal glands, which are a pair of glands on top of the kidneys. Starting at puberty, testosterone stimulates the development of secondary sex characteristics. This includes a deeper voice, growth of muscles and body hair, and penis enlargement. Females also produce testosterone in both the adrenal glands and ovaries. A male's body converts testosterone into estradiol, the main male sex hormone. An abnormal level of testosterone can cause health issues in both males and females. You may have this test if your health care provider suspects that an abnormal testosterone level is causing or contributing to other health problems. In males, symptoms of an abnormal testosterone level include:  Infertility.  Erectile dysfunction.  Delayed puberty or premature  puberty.  In females, symptoms of an abnormally high testosterone level include:  Infertility.  Polycystic ovarian syndrome (PCOS).  Developing masculine features (virilization).  What kind of sample is taken? This test requires a blood sample taken from a vein in your arm or hand. The sample for this test is usually collected in the morning. The amount of testosterone in your blood is highest at that time. What do the results mean? It is your responsibility to obtain your test results. Ask the lab or department performing the test when and how you will get your results. Contact your health care provider to discuss any questions you have about  your results. The result of a blood test for testosterone will be given as a range of values. A testosterone level that is outside the normal range may indicate a health problem. Testosterone is measured in nanograms per deciliter (ng/dL). Range of normal values Ranges for normal values may vary among different labs and hospitals. You should always check with your health care provider after having lab work or other tests done to discuss whether your values are considered within normal limits. Normal levels of total testosterone are as follows:  Male: ? 7 months to 66 years old: less than 30 ng/dL. ? 64-20 years old: less than 300 ng/dL. ? 68-90 years old: 170-540 ng/dL. ? 73-45 years old: 250-910 ng/dL. ? 66 years old and over: 280-1,080 ng/dL.  Male: ? 7 months to 66 years old: less than 30 ng/dL. ? 78-55 years old: less than 40 ng/dL. ? 33-14 years old: less than 60 ng/dL. ? 27-20 years old: less than 70 ng/dL. ? 66 years old and over: less than 70 ng/dL.  Meaning of results outside normal value ranges A testosterone level that is too low or too high can indicate a number of health problems. In males:  A high testosterone level can occur if you: ? Have certain types of tumors. ? Have an overactive thyroid gland (hyperthyroidism). ? Use anabolic steroids. ? Are starting puberty early (precocious puberty). ? Have an inherited disorder that affects the adrenal glands (congenital adrenal hyperplasia).  A low testosterone level can occur if you: ? Have certain genetic diseases. ? Have had certain viral infections, such as mumps. ? Have pituitary disease. ? Have had an injury to the testicles. ? Are an alcoholic.  In females:  A high testosterone level can occur if you have: ? Certain types of tumors. ? An inherited disorder that affects certain cells in the adrenal glands (congenital adrenocortical hyperplasia). ? PCOS.  A low testosterone level does not cause health  problems.  Discuss the results of your testosterone test with your health care provider. Your health care provider will use the results of this test and other tests to make a diagnosis. Talk with your health care provider to discuss your results, treatment options, and if necessary, the need for more tests. Talk with your health care provider if you have any questions about your results. This information is not intended to replace advice given to you by your health care provider. Make sure you discuss any questions you have with your health care provider. Document Released: 09/25/2004 Document Revised: 05/10/2016 Document Reviewed: 01/04/2014 Elsevier Interactive Patient Education  [employer].

## 2017-10-30 ENCOUNTER — Telehealth: Payer: Self-pay | Admitting: Family Medicine

## 2017-10-30 LAB — VITAMIN D 25 HYDROXY (VIT D DEFICIENCY, FRACTURES): VIT D 25 HYDROXY: 28.3 ng/mL — AB (ref 30.0–100.0)

## 2017-10-30 LAB — VITAMIN B12: VITAMIN B 12: 328 pg/mL (ref 232–1245)

## 2017-10-30 LAB — HEMOGLOBIN A1C
Est. average glucose Bld gHb Est-mCnc: 154 mg/dL
HEMOGLOBIN A1C: 7 % — AB (ref 4.8–5.6)

## 2017-10-30 LAB — MICROALBUMIN / CREATININE URINE RATIO
CREATININE, UR: 180.2 mg/dL
Microalb/Creat Ratio: 2.8 mg/g creat (ref 0.0–30.0)
Microalbumin, Urine: 5 ug/mL

## 2017-10-30 NOTE — Telephone Encounter (Signed)
Copied from [place] #[id number]. Topic: Quick Communication - See Telephone Encounter >> Oct 30, 2017 11:51 AM Cleaster Corin, NT wrote: CRM for notification. See Telephone encounter for:   10/30/17. Pt. Calling to see if he have a doctors note for appt. For 10-29-2017 pt. Was supposed to work that night but did not go due to office visit. (make sure date of birth is not on paper per pt. Request) pt. Also wanted to know when is he due to have his colostomy. Pt. Can be reached at  [phone]

## 2017-10-31 ENCOUNTER — Encounter: Payer: Self-pay | Admitting: *Deleted

## 2017-10-31 NOTE — Telephone Encounter (Signed)
Pt came in wanting to ask about getting a referral to have a colostomy done at [facility] physicians..  Please advise

## 2017-10-31 NOTE — Telephone Encounter (Signed)
Yes, fine to write work note and I will place referral to [facility] for colonoscopy.

## 2017-11-03 ENCOUNTER — Encounter: Payer: Self-pay | Admitting: Radiology

## 2017-11-06 NOTE — Telephone Encounter (Signed)
Letter written.  Pt called [facility] letter put in mail today - may take up to 5 days to get. Advised referral sent to [facility] for colonoscopy.  May take 1-2 weeks to hear back from them

## 2017-11-23 ENCOUNTER — Ambulatory Visit: Payer: 59 | Admitting: Family Medicine

## 2017-11-24 ENCOUNTER — Ambulatory Visit: Payer: 59 | Admitting: Podiatry

## 2017-11-30 ENCOUNTER — Ambulatory Visit: Payer: 59 | Admitting: Family Medicine

## 2017-12-11 ENCOUNTER — Ambulatory Visit: Payer: 59 | Admitting: Podiatry

## 2017-12-19 ENCOUNTER — Ambulatory Visit (INDEPENDENT_AMBULATORY_CARE_PROVIDER_SITE_OTHER): Payer: 59 | Admitting: Family Medicine

## 2017-12-19 ENCOUNTER — Other Ambulatory Visit: Payer: Self-pay

## 2017-12-19 ENCOUNTER — Encounter: Payer: Self-pay | Admitting: Family Medicine

## 2017-12-19 VITALS — BP 122/76 | HR 117 | Temp 98.8°F | Resp 20 | Ht 68.25 in | Wt 245.0 lb

## 2017-12-19 DIAGNOSIS — G4733 Obstructive sleep apnea (adult) (pediatric): Secondary | ICD-10-CM | POA: Diagnosis not present

## 2017-12-19 DIAGNOSIS — E559 Vitamin D deficiency, unspecified: Secondary | ICD-10-CM | POA: Diagnosis not present

## 2017-12-19 DIAGNOSIS — E119 Type 2 diabetes mellitus without complications: Secondary | ICD-10-CM | POA: Diagnosis not present

## 2017-12-19 DIAGNOSIS — E785 Hyperlipidemia, unspecified: Secondary | ICD-10-CM | POA: Diagnosis not present

## 2017-12-19 DIAGNOSIS — G4726 Circadian rhythm sleep disorder, shift work type: Secondary | ICD-10-CM

## 2017-12-19 DIAGNOSIS — R5382 Chronic fatigue, unspecified: Secondary | ICD-10-CM

## 2017-12-19 DIAGNOSIS — G9332 Myalgic encephalomyelitis/chronic fatigue syndrome: Secondary | ICD-10-CM

## 2017-12-19 NOTE — Progress Notes (Signed)
Subjective:  By signing my name below, I, Kirk Oneal, attest that this documentation has been prepared under the direction and in the presence of [name], MD. Electronically Signed: Moises Oneal, [place]. 12/19/2017 , 4:02 PM .  Patient was seen in Room 1 .   Patient ID: Kirk Oneal, male    DOB: 1951/09/17, 67 y.o.   MRN: [id number] Chief Complaint  Patient presents with  . Follow-up    Wanting Dx for chronic fatgue  . Medication Refill    Amaryl, Meclizine, zofran & Pravastatin   HPI Kirk Oneal is a 67 y.o. male who presents to Primary Care at [facility] for follow up. Patient has been seen for chronic fatigue issues for many months. He has several chronic diseases, such as sleep apnea and diabetes, which could worsen his symptoms. So, initially we had focused on maximizing treatment of these conditions; however, fatigue symptoms continued to worsen despite that. There's also been concerns that possible exposure to toxins while he was in the [employer] service in 1983; could be causing or exacerbating many of his current symptoms. In addition, he has shift-work disorder, so started him on Belsomra, which improved his sleep quantity significantly, but fatigue persisted. At his visit 6 weeks ago, gave him a trial of B-12 injection, started Vitamin D replacement and continued Belsomra to help with sleep, but discussed that if symptoms persisted, may want to consider whether he has chronic fatigue syndrome.   Patient states his fatigue has greatly improved after B-12 injection while taking Vitamin D supplement and Belsomra. His sleep is less than 10 hours now, and wakes up feeling more rested in the morning. He notes his chronic fatigue has been present as early as 2016. He uses his CPAP machine regularly.   Patient reports missing 20 days of work already for 2019 due to fatigue; he's missed 7 weeks last year and 7 weeks the previous year. With the fatigue, he's behind on chores and he's  skipped out on plans with his wife. With Belsomra, he would still feel tired afterwards. After a big day at work and/or plans going out, he feels more fatigued with both physical muscle aches and mental fatigue. When he's doing taxes or meetings at work, it wears him out more than it used to. He usually puts off tasks because it'll be more difficult when feeling fatigued. When he wakes up from sleep, he doesn't feel 100% refreshed. When under stress, he handles situations well, but not as efficient because internally struggling. When he becomes fatigued, he states being less efficient. When standing upright, he feels more achy and fatigue, improves with laying down. She reports requiring about 2-3 days off per fatigue episode.   His heart rate was a little high on triage, but he reports he hasn't drunk any water this morning and drank wine last night. He denies feeling any palpitations. 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 His leg stiffness improves after moving around.   Past Medical History:  Diagnosis Date  . Diabetes mellitus without complication ([place])   . 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 Prior to Admission medications   Medication Sig Start Date End Date Taking? Authorizing Provider  clopidogrel (PLAVIX) 75 MG tablet Take 1 tablet (75 mg total) by mouth daily. 07/20/17   [name], MD  glimepiride (AMARYL) 4 MG tablet Take 1 tablet (  4 mg total) by mouth daily. with food 07/20/17   [name], MD  meclizine (ANTIVERT) 25 MG tablet Take 1-2 tablets (25-50 mg total) by mouth 3 (three) times daily as needed for dizziness. 07/20/17   [name], MD  meloxicam (MOBIC) 15 MG tablet Take 1 tablet (15 mg total) by mouth daily as needed for pain. 11/01/16   [name], MD  metFORMIN (GLUCOPHAGE) 1000 MG tablet Take 1 tablet (1,000 mg total) by mouth 2 (two) times daily with a meal.  02/22/16   [name], MD  ondansetron (ZOFRAN ODT) 4 MG disintegrating tablet Take 1 tablet (4 mg total) by mouth every 8 (eight) hours as needed for nausea or vomiting. 07/20/17   [name], MD  pravastatin (PRAVACHOL) 40 MG tablet Take 1 tablet (40 mg total) by mouth daily. 11/16/16   [name], MD  Suvorexant (BELSOMRA) 15 MG TABS Take 15 mg by mouth at bedtime as needed. 10/29/17   [name], MD  Vitamin D, Ergocalciferol, (DRISDOL) 50000 units CAPS capsule Take 1 capsule (50,000 Units total) by mouth every 7 (seven) days. 10/29/17   [name], MD  ezetimibe (ZETIA) 10 MG tablet Take 1 tablet (10 mg total) by mouth daily. Patient not taking: Reported on 09/17/2015 07/10/15 09/17/15  Wendie Agreste, MD   No Known Allergies Family History  Problem Relation Age of Onset  . Stomach cancer Mother   . Rectal cancer Father    Social History   Socioeconomic History  . Marital status: Married    Spouse name: sharon  . Number of children: 2  . Years of education: college  . Highest education level: Not on file  Occupational History    Employer: [employer]  Social Needs  . Financial resource strain: Not on file  . Food insecurity:    Worry: Not on file    Inability: Not on file  . Transportation needs:    Medical: Not on file    Non-medical: Not on file  Tobacco Use  . Smoking status: Never Smoker  . Smokeless tobacco: Never Used  Substance and Sexual Activity  . Alcohol use: Yes    Alcohol/week: 0.0 oz    Comment: occasional  . Drug use: No  . Sexual activity: Never  Lifestyle  . Physical activity:    Days per week: Not on file    Minutes per session: Not on file  . Stress: Not on file  Relationships  . Social connections:    Talks on phone: Not on file    Gets together: Not on file    Attends religious service: Not on file    Active member of club or organization: Not on file    Attends meetings of clubs or organizations: Not on file    Relationship status:  Not on file  Other Topics Concern  . Not on file  Social History Narrative   Patient is right handed, resides with wife   1cup of coffee a day    Depression screen [facility] 2/9 10/29/2017 08/15/2017 07/20/2017 11/01/2016 11/01/2016  Decreased Interest 0 0 0 0 0  Down, Depressed, Hopeless 0 0 0 0 0  PHQ - 2 Score 0 0 0 0 0    Review of Systems  Constitutional: Positive for fatigue. Negative for unexpected weight change.  Eyes: Negative for visual disturbance.  Respiratory: Negative for cough, chest tightness and shortness of breath.   Cardiovascular: Negative for  chest pain, palpitations and leg swelling.  Gastrointestinal: Negative for abdominal pain and Oneal in stool.  Musculoskeletal: Positive for arthralgias (improves).  Neurological: Negative for dizziness, light-headedness and headaches.       Objective:   Physical Exam  Constitutional: He is oriented to person, place, and time. He appears well-developed and well-nourished. No distress.  HENT:  Head: Normocephalic and atraumatic.  Eyes: Pupils are equal, round, and reactive to light. EOM are normal.  Neck: Neck supple.  Cardiovascular: Regular rhythm. Tachycardia present.  Pulmonary/Chest: Effort normal. No respiratory distress.  Musculoskeletal: Normal range of motion.  Neurological: He is alert and oriented to person, place, and time.  Skin: Skin is warm and dry.  Psychiatric: He has a normal mood and affect. His behavior is normal.  Nursing note and vitals reviewed.   BP 122/76   Pulse (!) 117   Temp 98.8 F (37.1 C) (Oral)   Resp 20   Ht 5' 8.25" (1.734 m)   Wt 245 lb (111.1 kg)   SpO2 96%   BMI 36.98 kg/m      Assessment & Plan:   1. Chronic fatigue syndrome - Will complete FMLA papers for chronic fatigue flairs at which time pt cannot make it to work - uses up to 2 days per episode, up to 15 episodes per 6 months.  2. Sleep disorder, shift work - doing much better on [employer] - continue (trazodone was to  sedating)  3. Obstructive sleep apnea - dx'd at [facility] in 2015 - pt reports good cpap compliance  4. Diabetes mellitus without complication (HCC) - cont glimeperide 4 am and metformin 1g bid. Recheck in 3 mos  5. Hyperlipidemia LDL goal <70 - cont pravastatin 40  6. Vitamin D deficiency - finish high dose once wkly vit D then change to otc 4000iu/d.    Meds ordered this encounter  Medications  . glimepiride (AMARYL) 4 MG tablet    Sig: Take 1 tablet (4 mg total) by mouth daily. with food    Dispense:  90 tablet    Refill:  1  . meclizine (ANTIVERT) 25 MG tablet    Sig: Take 1-2 tablets (25-50 mg total) by mouth 3 (three) times daily as needed for dizziness.    Dispense:  30 tablet    Refill:  2  . pravastatin (PRAVACHOL) 40 MG tablet    Sig: Take 1 tablet (40 mg total) by mouth daily.    Dispense:  90 tablet    Refill:  1  . metFORMIN (GLUCOPHAGE) 1000 MG tablet    Sig: Take 1 tablet (1,000 mg total) by mouth 2 (two) times daily with a meal.    Dispense:  180 tablet    Refill:  0    I personally performed the services described in this documentation, which was scribed in my presence. The recorded information has been reviewed and considered, and addended by me as needed.   [name], M.D.  Primary Care at [facility] [address] [place], [place] [address] [phone] phone [phone] fax  01/08/18 5:56 AM

## 2017-12-19 NOTE — Patient Instructions (Signed)
Come back in for repeat evaluation if your pulse is still >100 this week.  How to Take a Pulse Your pulse is the increase in pressure inside the blood vessels that carry blood from your heart to the rest of your body (arteries). Every time your heart beats, you can feel your pulse in an artery near the surface of your skin. You can easily feel your pulse in the artery in your wrist (radial artery) and in the artery in your neck (carotid artery). Taking your pulse can tell you how fast your heart is beating and whether it has a normal rhythm. You can also tell whether your heart is beating strongly or weakly. What you need to know about pulse rates Your pulse is the same as your heart rate. Both are measured in beats per minute (bpm). A normal resting heart rate varies depending on a person's age.  Infants under 1 year of age: Normal heart rate of 100-160 bpm.  Children 70-56 years of age: Normal heart rate of 90-150 bpm.  Children 76-80 years of age: Normal heart rate of 80-140 bpm.  Children 36-66 years of age: Normal heart rate of 70-120 bpm.  Everyone over 82 years of age: Normal heart rate of 60-100 bpm.  There can be a lot of variation in your pulse. It can be different depending on the time of day or the amount of exercise that you get. It changes with your fitness level. Many things can change the speed and regularity of your pulse. These include:  Exercise.  Fever.  Stress.  Heart problems.  Poor circulation.  Medicines.  How to take your pulse To take your pulse, all you need is a digital stopwatch or a clock or watch that has a second hand. The best time to measure your resting pulse is in the morning before you start moving around. Take it as soon as you wake up or after resting for about 10 minutes. There are no firm rules about how often to check your pulse. In general, it is a good idea to check your pulse at least once a month. Measuring your pulse is a good way to check  your heart health. Checking your pulse before and after exercise can tell you if you are getting the right amount of exercise. This is called finding your target heart rate. Your target heart rate depends on your age, fitness, and health. Ask your health care provider what would be a safe target heart rate for you during exercise. Radial Pulse To check the pulse in your radial artery: 1. Turn one hand palm-up and relax your arm. 2. Place the first two fingers of your other hand gently over your wrist, just below the base of your thumb. 3. Place your fingertips just inside the bone that runs along the outside of your arm. 4. Slowly increase pressure until you feel a pulsing beneath your fingers. You may need to move your fingers slightly. 5. Do not press too hard. Too much pressure may cut off blood supply. 6. Count how many pulse beats you feel in 1 minute. Or, count how many pulse beats you feel in 30 seconds and double that number. 7. Pay attention to the rhythm of the pulse. It should be steady and even.  Carotid Pulse To check the pulse in your carotid artery: 1. Place two fingers just to one side of your Adam's apple so that you feel a pulsing beneath your fingers. 2. Do not press too  hard. Too much pressure may cut off blood supply and can make you dizzy. 3. Count how many pulse beats you feel in 1 minute. Or, count how many pulse beats you feel in 30 seconds and double that number. 4. Pay attention to the rhythm of the pulse. It should be steady and even.  Contact a health care provider if:  Your pulse is too slow or too fast.  Your pulse is weak or hard to find.  You have skipped beats or extra beats.  Your pulse has an irregular rhythm.  You have an abnormal pulse along with dizziness, fatigue, or shortness of breath. This information is not intended to replace advice given to you by your health care provider. Make sure you discuss any questions you have with your health care  provider. Document Released: 03/15/2003 Document Revised: 03/28/2016 Document Reviewed: 02/12/2016 Elsevier Interactive Patient Education  2018 [place].   Chronic Fatigue Syndrome Chronic fatigue syndrome (CFS) is a condition that causes extreme tiredness (fatigue). This fatigue does not improve with rest, and it gets worse with physical or mental activity. You may have several other symptoms along with fatigue. Symptoms may come and go, but they generally last for months. Sometimes, CFS gets better over time, but it can be a lifelong condition. There is no cure, but there are many possible treatments. You will need to work with your health care providers to find a treatment plan that works best for you. What are the causes? The cause of CFS is not known. There may be more than one cause. Possible causes include:  An infection.  An abnormal body defense system (immune system).  Low blood pressure.  Poor diet.  Physical or emotional stress.  What increases the risk? You are more likely to develop this condition if:  You are male.  You are 7?66 years old.  You have a family history of CFS.  You live with a lot of emotional stress.  What are the signs or symptoms? The main symptom of CFS is fatigue that is severe enough to interfere with day-to-day activities. This fatigue does not get better with rest, and it gets worse with physical or mental activity. There are eight other major symptoms of CFS:  Lack of energy (malaise) that lasts more than 24 hours after physical exertion.  Sleep that does not relieve fatigue (unrefreshing sleep).  Short-term memory loss or confusion.  Joint pain without redness or swelling.  Muscle aches.  Headaches.  Painful and swollen glands (lymph nodes) in the neck or under the arms.  Sore throat.  You may also have:  Abdominal cramps, constipation, or diarrhea (irritable bowel).  Chills.  Night sweats.  Vision  changes.  Dizziness.  Mental confusion (brain fog).  Clumsiness.  Sensitivity to food, noise, or odors.  Mood swings, depression, or anxiety attacks.  How is this diagnosed? There are no tests that can diagnose this condition. Your health care provider will make the diagnosis based on your medical history, a physical exam, and a mental health exam. However, it is important to make sure that your symptoms are not caused by another medical condition. You may have lab tests or X-rays to rule out other conditions. For your health care provider to diagnose CFS:  You must have had fatigue for at least 6 straight months.  Fatigue must be your first symptom, and it must be severe enough to interfere with day-to-day activities.  There must be no other cause found for the  fatigue.  You must also have at least four of the eight other major symptoms of CFS.  How is this treated? There is no cure for CFS. The condition affects everyone differently. You will need to work with your team of health care providers to find the best treatments for your symptoms. Your team may include your primary care provider, physical and exercise therapists, and mental health therapists. Treatment may include:  Improving sleep with a regular bedtime routine.  Avoiding caffeine, alcohol, and tobacco.  Doing light exercise and stretching during the day.  Taking medicines to help you sleep or to relieve joint or muscle pain.  Learning and practicing relaxation techniques.  Using memory aids or doing brainteasers to improve memory and concentration.  Seeing a mental health therapist to evaluate and treat depression, if necessary.  Trying massage therapy, acupuncture, and movement exercises, such as yoga or tai chi.  Follow these instructions at home:  Activity  Exercise regularly, as told by your health care provider.  Avoid fatigue by pacing yourself during the day and getting enough sleep at  night.  Go to bed and get up at the same time every day. Eating and drinking  Avoid caffeine and alcohol.  Avoid heavy meals in the evening.  Eat a well-balanced diet. General instructions  Take over-the-counter and prescription medicines only as told by your health care provider.  Do not use herbal or dietary supplements unless they are approved by your health care provider.  Maintain a healthy weight.  Avoid stress and use stress-reducing techniques that you learn in therapy.  Do not use any products that contain nicotine or tobacco, such as cigarettes and e-cigarettes. If you need help quitting, ask your health care provider.  Consider joining a CFS support group.  Keep all follow-up visits as told by your health care provider. This is important. Contact a health care provider if:  Your symptoms do not get better or they get worse.  You feel angry, guilty, anxious, or depressed. This information is not intended to replace advice given to you by your health care provider. Make sure you discuss any questions you have with your health care provider. Document Released: 10/16/2004 Document Revised: 05/15/2016 Document Reviewed: 12/17/2015 Elsevier Interactive Patient Education  [employer].

## 2017-12-23 ENCOUNTER — Ambulatory Visit: Payer: 59 | Admitting: Podiatry

## 2017-12-28 ENCOUNTER — Telehealth: Payer: Self-pay | Admitting: Family Medicine

## 2017-12-28 NOTE — Telephone Encounter (Signed)
Patient brought in FMLA forms to be completed by Dr Brigitte Pulse. I am not sure why this patients needs FMLA there is nothing in the notes and the provider notes are not completed from [place] 3/30 so im not sure if it was mentioned during the visit.  So I have left the forms blank and I will place the in Dr Raul Del box on 12/28/17 please return them to the FMLA/Disability desk with in 5-7 business days thank you!

## 2018-01-05 NOTE — Telephone Encounter (Signed)
Have we completed these forms? °

## 2018-01-08 DIAGNOSIS — R5382 Chronic fatigue, unspecified: Secondary | ICD-10-CM | POA: Insufficient documentation

## 2018-01-08 DIAGNOSIS — E559 Vitamin D deficiency, unspecified: Secondary | ICD-10-CM | POA: Insufficient documentation

## 2018-01-08 DIAGNOSIS — G4726 Circadian rhythm sleep disorder, shift work type: Secondary | ICD-10-CM | POA: Insufficient documentation

## 2018-01-08 DIAGNOSIS — G9332 Myalgic encephalomyelitis/chronic fatigue syndrome: Secondary | ICD-10-CM | POA: Insufficient documentation

## 2018-01-08 MED ORDER — PRAVASTATIN SODIUM 40 MG PO TABS: 40.0000 mg | ORAL_TABLET | Freq: Every day | ORAL | 1 refills | Status: DC

## 2018-01-08 MED ORDER — METFORMIN HCL 1000 MG PO TABS
1000.0000 mg | ORAL_TABLET | Freq: Two times a day (BID) | ORAL | 0 refills | Status: DC
Start: 2018-01-08 — End: 2018-08-14

## 2018-01-08 MED ORDER — GLIMEPIRIDE 4 MG PO TABS
4.0000 mg | ORAL_TABLET | Freq: Every day | ORAL | 1 refills | Status: DC
Start: 2018-01-08 — End: 2018-07-09

## 2018-01-08 MED ORDER — MECLIZINE HCL 25 MG PO TABS: 25.0000 mg | ORAL_TABLET | Freq: Three times a day (TID) | ORAL | 2 refills | Status: DC | PRN

## 2018-01-08 NOTE — Telephone Encounter (Signed)
Completed. Will return to 102 FMLA box this a.m.

## 2018-01-11 ENCOUNTER — Encounter: Payer: Self-pay | Admitting: Family Medicine

## 2018-01-12 NOTE — Telephone Encounter (Signed)
Paperwork scanned and faxed on 01/12/18

## 2018-01-27 ENCOUNTER — Other Ambulatory Visit: Payer: Self-pay | Admitting: Family Medicine

## 2018-02-13 ENCOUNTER — Other Ambulatory Visit: Payer: Self-pay | Admitting: Family Medicine

## 2018-05-14 ENCOUNTER — Telehealth: Payer: Self-pay | Admitting: Gastroenterology

## 2018-05-17 ENCOUNTER — Encounter: Payer: Self-pay | Admitting: Gastroenterology

## 2018-05-17 NOTE — Telephone Encounter (Signed)
Dr. Silverio Decamp reviewed pt's records and indicated that pt needs to be scheduled for a direct colon. Left message in pt's phone to call back.

## 2018-07-07 ENCOUNTER — Telehealth: Payer: Self-pay | Admitting: *Deleted

## 2018-07-07 ENCOUNTER — Ambulatory Visit (INDEPENDENT_AMBULATORY_CARE_PROVIDER_SITE_OTHER): Payer: 59 | Admitting: Gastroenterology

## 2018-07-07 ENCOUNTER — Encounter (INDEPENDENT_AMBULATORY_CARE_PROVIDER_SITE_OTHER): Payer: Self-pay

## 2018-07-07 ENCOUNTER — Encounter: Payer: Self-pay | Admitting: Gastroenterology

## 2018-07-07 VITALS — BP 132/78 | HR 76 | Ht 68.0 in | Wt 245.2 lb

## 2018-07-07 DIAGNOSIS — Z7902 Long term (current) use of antithrombotics/antiplatelets: Secondary | ICD-10-CM

## 2018-07-07 DIAGNOSIS — Z86010 Personal history of colonic polyps: Secondary | ICD-10-CM | POA: Diagnosis not present

## 2018-07-07 MED ORDER — NA SULFATE-K SULFATE-MG SULF 17.5-3.13-1.6 GM/177ML PO SOLN: 1.0000 | Freq: Once | ORAL | 0 refills | Status: AC

## 2018-07-07 NOTE — Telephone Encounter (Signed)
  07/07/2018   RE: Kirk Oneal DOB: 06/03/1951 MRN: [id number]   Dear Dr [name],    We have scheduled the above patient for an endoscopic procedure. Our records show that he is on anticoagulation therapy.   Please advise as to how long the patient may come off his therapy of plavix prior to the procedure, which is scheduled for 07/30/2018.  Please fax back/ or route the completed form to [facility] at [phone].   Sincerely,    Tonita Phoenix

## 2018-07-07 NOTE — Patient Instructions (Signed)
You have been scheduled for a colonoscopy. Please follow written instructions given to you at your visit today.  Please pick up your prep supplies at the pharmacy within the next 1-3 days. If you use inhalers (even only as needed), please bring them with you on the day of your procedure. Your physician has requested that you go to www.startemmi.com and enter the access code given to you at your visit today. This web site gives a general overview about your procedure. However, you should still follow specific instructions given to you by our office regarding your preparation for the procedure.  If you are age 37 or older, your body mass index should be between 23-30. Your Body mass index is 37.29 kg/m. If this is out of the aforementioned range listed, please consider follow up with your Primary Care Provider.  If you are age 57 or younger, your body mass index should be between 19-25. Your Body mass index is 37.29 kg/m. If this is out of the aformentioned range listed, please consider follow up with your Primary Care Provider.    Thank you for choosing [facility]  Karleen Hampshire Nand[place]am,MD

## 2018-07-07 NOTE — Progress Notes (Signed)
                Kirk Oneal    [id number]    01/21/1951  Primary Care Physician:Shaw, Eva N, MD  Referring Physician: Shaw, Eva N, MD [address] [place], [place] [address]  Chief complaint: History of colon polyps HPI: 67-year-old male with history of colon polyps, previously followed by Dr. Johnson at [facility] is here to discuss surveillance colonoscopy. Last colonoscopy by Dr. Johnson August 11, 2012 with removal of small hyperplastic polyp.  He was recommended to have recall colonoscopy in 5 years due to history of adenomatous polyp with villous component.  Prior colonoscopy reports not available for review during this visit.  His father had metastatic cancer of unknown primary, thinks it might have been colon cancer.  Brother and sister had colon polyps removed. 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  Denies any nausea, vomiting, abdominal pain, melena or bright red blood per rectum   Outpatient Encounter Medications as of 07/07/2018  Medication Sig  . clopidogrel (PLAVIX) 75 MG tablet Take 1 tablet (75 mg total) by mouth daily.  . Cyanocobalamin (VITAMIN B-12 PO) Take 1 tablet by mouth daily.  . glimepiride (AMARYL) 4 MG tablet Take 1 tablet (4 mg total) by mouth daily. with food  . meclizine (ANTIVERT) 25 MG tablet Take 1-2 tablets (25-50 mg total) by mouth 3 (three) times daily as needed for dizziness.  . meloxicam (MOBIC) 15 MG tablet Take 1 tablet (15 mg total) by mouth daily as needed for pain.  . metFORMIN (GLUCOPHAGE) 1000 MG tablet Take 1 tablet (1,000 mg total) by mouth 2 (two) times daily with a meal.  . ondansetron (ZOFRAN ODT) 4 MG disintegrating tablet Take 1 tablet (4 mg total) by mouth every 8 (eight) hours as needed for nausea or vomiting.  . pravastatin (PRAVACHOL) 40 MG tablet Take 1 tablet (40 mg total) by mouth  daily. (Patient taking differently: Take 40 mg by mouth as needed. )  . Suvorexant (BELSOMRA) 15 MG TABS Take 15 mg by mouth at bedtime as needed.  . Vitamin D, Ergocalciferol, (DRISDOL) 50000 units CAPS capsule TAKE 1 CAPSULE (50,000 UNITS TOTAL) BY MOUTH EVERY 7 (SEVEN) DAYS.  . Na Sulfate-K Sulfate-Mg Sulf (SUPREP BOWEL PREP KIT) 17.5-3.13-1.6 GM/177ML SOLN Take 1 kit by mouth once for 1 dose.  . [DISCONTINUED] ezetimibe (ZETIA) 10 MG tablet Take 1 tablet (10 mg total) by mouth daily. (Patient not taking: Reported on 09/17/2015)   No facility-administered encounter medications on file as of 07/07/2018.     Allergies as of 07/07/2018  . (No Known Allergies)    Past Medical History:  Diagnosis Date  . Arthritis   . Colon polyps   . Diabetes mellitus without complication (HCC)   . Dissection of vertebral artery (HCC) 05/17/2013  . Diverticulosis   . HLD (hyperlipidemia)   . Sleep apnea    CPAP  . Vitamin D deficiency     Past Surgical History:  Procedure Laterality Date  . MENISCUS REPAIR Left     Family History  Problem Relation Age of Onset  . Stomach cancer Mother   . Rectal cancer Father   . Colon polyps Sister   . Colon polyps Brother     Social History   Socioeconomic History  . Marital status: Married    Spouse name: sharon  . Number of children: 2  .   Years of education: college  . Highest education level: Not on file  Occupational History    Employer: [employer]  Social Needs  . Financial resource strain: Not on file  . Food insecurity:    Worry: Not on file    Inability: Not on file  . Transportation needs:    Medical: Not on file    Non-medical: Not on file  Tobacco Use  . Smoking status: Never Smoker  . Smokeless tobacco: Never Used  Substance and Sexual Activity  . Alcohol use: Yes    Alcohol/week: 0.0 standard drinks    Comment: occasional  . Drug use: No  . Sexual activity: Never  Lifestyle  . Physical activity:    Days per week:  Not on file    Minutes per session: Not on file  . Stress: Not on file  Relationships  . Social connections:    Talks on phone: Not on file    Gets together: Not on file    Attends religious service: Not on file    Active member of club or organization: Not on file    Attends meetings of clubs or organizations: Not on file    Relationship status: Not on file  . Intimate partner violence:    Fear of current or ex partner: Not on file    Emotionally abused: Not on file    Physically abused: Not on file    Forced sexual activity: Not on file  Other Topics Concern  . Not on file  Social History Narrative   Patient is right handed, resides with wife   1cup of coffee a day       Review of systems: Review of Systems  Constitutional: Negative for fever and chills.  Positive for fatigue HENT: Negative.   Eyes: Negative for blurred vision.  Respiratory: Negative for cough, shortness of breath and wheezing.   Cardiovascular: Negative for chest pain and palpitations.  Gastrointestinal: as per HPI Genitourinary: Negative for dysuria, urgency, frequency and hematuria.  Musculoskeletal: Negative for myalgias, back pain and positive for joint pain.  Skin: Negative for itching and rash.  Neurological: Negative for dizziness, tremors, focal weakness, seizures and loss of consciousness.  Endo/Heme/Allergies: Negative for seasonal allergies.  Psychiatric/Behavioral: Negative for depression, suicidal ideas and hallucinations.  All other systems reviewed and are negative.   Physical Exam: Vitals:   07/07/18 0923  BP: 132/78  Pulse: 76   Body mass index is 37.29 kg/m. Gen:      No acute distress HEENT:  EOMI, sclera anicteric Neck:     No masses; no thyromegaly Lungs:    Clear to auscultation bilaterally; normal respiratory effort CV:         Regular rate and rhythm; no murmurs Abd:      + bowel sounds; soft, non-tender; no palpable masses, no distension Ext:    No edema; adequate  peripheral perfusion Skin:      Warm and dry; no rash Neuro: alert and oriented x 3 Psych: normal mood and affect  Data Reviewed:  Reviewed labs, radiology imaging, old records and pertinent past GI work up   Assessment and Plan/Recommendations:  67-year-old male with history of tubulovillous colon adenomas, here to discuss surveillance colonoscopy.  Due for recall colonoscopy based on last colonoscopy report by Dr. Johnson The risks and benefits as well as alternatives of endoscopic procedure(s) have been discussed and reviewed. All questions answered. The patient agrees to proceed. Will discuss with Dr. Shaw if okay   to hold Plavix for 5 days prior to procedure  Return after the procedure as needed  K. Denzil Magnuson , MD [phone]    CC: [name], MD

## 2018-07-09 ENCOUNTER — Other Ambulatory Visit: Payer: Self-pay | Admitting: Family Medicine

## 2018-07-09 NOTE — Telephone Encounter (Signed)
Requested medication (s) are due for refill today:  yes  Requested medication (s) are on the active medication list:  yes  Future visit scheduled:  no  Last Refill: 01/08/18; # 90, RF x 1  Pt. was advised at [facility] 12/19/17 to f/u in 3 mos.; has not followed up.   Requested Prescriptions  Pending Prescriptions Disp Refills   glimepiride (AMARYL) 4 MG tablet [Pharmacy Med Name: GLIMEPIRIDE 4 MG TABLET] 90 tablet 1    Sig: TAKE 1 TABLET (4 MG TOTAL) BY MOUTH DAILY. WITH FOOD     Endocrinology:  Diabetes - Sulfonylureas Failed - 07/09/2018  1:45 AM      Failed - HBA1C is between 0 and 7.9 and within 180 days    Hgb A1c MFr Bld  Date Value Ref Range Status  10/29/2017 7.0 (H) 4.8 - 5.6 % Final    Comment:             Prediabetes: 5.7 - 6.4          Diabetes: >6.4          Glycemic control for adults with diabetes: <7.0          Failed - Valid encounter within last 6 months    Recent Outpatient Visits          6 months ago Chronic fatigue syndrome   [facility], Laurey Arrow, MD   8 months ago Diabetes mellitus without complication [facility])   [facility], Laurey Arrow, MD   10 months ago Chronic fatigue   [facility], Laurey Arrow, MD   11 months ago Diabetes mellitus without complication [facility])   [facility], Laurey Arrow, MD   1 year ago Type 2 diabetes mellitus with hyperglycemia, without long-term current use of insulin [facility])   [facility], Laurey Arrow, MD           Signed Prescriptions Disp Refills   pravastatin (PRAVACHOL) 40 MG tablet 90 tablet 1    Sig: TAKE 1 TABLET BY MOUTH EVERY DAY     Cardiovascular:  Antilipid - Statins Failed - 07/09/2018  1:45 AM      Failed - HDL in normal range and within 360 days    HDL  Date Value Ref Range Status  07/20/2017 39 (L) >39 mg/dL Final         Passed - Total Cholesterol in normal range and within 360 days    Cholesterol, Total  Date Value Ref Range Status  07/20/2017 165 100 -  199 mg/dL Final         Passed - LDL in normal range and within 360 days    LDL Calculated  Date Value Ref Range Status  07/20/2017 96 0 - 99 mg/dL Final         Passed - Triglycerides in normal range and within 360 days    Triglycerides  Date Value Ref Range Status  07/20/2017 149 0 - 149 mg/dL Final         Passed - Patient is not pregnant      Passed - Valid encounter within last 12 months    Recent Outpatient Visits          6 months ago Chronic fatigue syndrome   [facility], Laurey Arrow, MD   8 months ago Diabetes mellitus without complication [facility])   [facility], Laurey Arrow, MD  10 months ago Chronic fatigue   [facility], Laurey Arrow, MD   11 months ago Diabetes mellitus without complication [facility])   [facility], Laurey Arrow, MD   1 year ago Type 2 diabetes mellitus with hyperglycemia, without long-term current use of insulin [facility])   [facility], Laurey Arrow, MD

## 2018-07-16 NOTE — Telephone Encounter (Signed)
Robin from [facility] calling stating that pt has an appt for a procedure on 07-30-18 and need pt to stop taking his Plavix please call her at [phone] or fax request at [phone].

## 2018-07-16 NOTE — Telephone Encounter (Signed)
Please advise 

## 2018-07-16 NOTE — Telephone Encounter (Addendum)
Have not seen pt in office in 7+ mos but called pt and he is asymptomatic as far as the vertebral artery dissection sxs he had from 2010-2014 - has been stable for 3-5 yrs now  So I am ok w/ pt holding plavix 5d prior to colonoscopy - resume immed after - I informed pt of this.  Pt not taking asa or other nsaids currently.

## 2018-07-19 NOTE — Telephone Encounter (Signed)
Ok thank you 

## 2018-07-20 NOTE — Telephone Encounter (Signed)
Left a message on both voicemail's for patient to return my call.

## 2018-07-21 NOTE — Telephone Encounter (Signed)
Left a message for patient to return my call. 

## 2018-07-21 NOTE — Telephone Encounter (Signed)
Informed patient he can hold Plavix 5 days prior to his procedure per Dr. Brigitte Pulse. Patient verbalized understanding.

## 2018-07-30 ENCOUNTER — Encounter: Payer: Self-pay | Admitting: Gastroenterology

## 2018-07-30 ENCOUNTER — Ambulatory Visit (AMBULATORY_SURGERY_CENTER): Payer: 59 | Admitting: Gastroenterology

## 2018-07-30 VITALS — BP 135/78 | HR 91 | Temp 96.6°F | Resp 17 | Ht 68.0 in | Wt 245.0 lb

## 2018-07-30 DIAGNOSIS — Z86010 Personal history of colonic polyps: Secondary | ICD-10-CM

## 2018-07-30 DIAGNOSIS — D122 Benign neoplasm of ascending colon: Secondary | ICD-10-CM | POA: Diagnosis not present

## 2018-07-30 MED ORDER — SODIUM CHLORIDE 0.9 % IV SOLN: 500.0000 mL | Freq: Once | INTRAVENOUS | Status: DC

## 2018-07-30 NOTE — Progress Notes (Signed)
Report given to PACU, vss 

## 2018-07-30 NOTE — Progress Notes (Signed)
Pt's states no medical or surgical changes since previsit or office visit. 

## 2018-07-30 NOTE — Progress Notes (Signed)
Called to room to assist during endoscopic procedure.  Patient ID and intended procedure confirmed with present staff. Received instructions for my participation in the procedure from the performing physician.  

## 2018-07-30 NOTE — Patient Instructions (Signed)
YOU HAD AN ENDOSCOPIC PROCEDURE TODAY AT [facility]:   Refer to the procedure report that was given to you for any specific questions about what was found during the examination.  If the procedure report does not answer your questions, please call your gastroenterologist to clarify.  If you requested that your care partner not be given the details of your procedure findings, then the procedure report has been included in a sealed envelope for you to review at your convenience later.  YOU SHOULD EXPECT: Some feelings of bloating in the abdomen. Passage of more gas than usual.  Walking can help get rid of the air that was put into your GI tract during the procedure and reduce the bloating. If you had a lower endoscopy (such as a colonoscopy or flexible sigmoidoscopy) you may notice spotting of blood in your stool or on the toilet paper. If you underwent a bowel prep for your procedure, you may not have a normal bowel movement for a few days.  Please Note:  You might notice some irritation and congestion in your nose or some drainage.  This is from the oxygen used during your procedure.  There is no need for concern and it should clear up in a day or so.  SYMPTOMS TO REPORT IMMEDIATELY:   Following lower endoscopy (colonoscopy or flexible sigmoidoscopy):  Excessive amounts of blood in the stool  Significant tenderness or worsening of abdominal pains  Swelling of the abdomen that is new, acute  Fever of 100F or higher  For urgent or emergent issues, a gastroenterologist can be reached at any hour by calling [phone].   DIET:  We do recommend a small meal at first, but then you may proceed to your regular diet.  Drink plenty of fluids but you should avoid alcoholic beverages for 24 hours.  MEDICATIONS: Continue present medications. Resume Plavix (clopidogrel) at prior dose in 3 days. No Aspirin, Ibuprofen, Naproxen, or other non-steroidal anti-inflammatory drugs for 2  weeks.  Please see handouts given to you by your recovery nurse.  ACTIVITY:  You should plan to take it easy for the rest of today and you should NOT DRIVE or use heavy machinery until tomorrow (because of the sedation medicines used during the test).    FOLLOW UP: Our staff will call the number listed on your records the next business day following your procedure to check on you and address any questions or concerns that you may have regarding the information given to you following your procedure. If we do not reach you, we will leave a message.  However, if you are feeling well and you are not experiencing any problems, there is no need to return our call.  We will assume that you have returned to your regular daily activities without incident.  If any biopsies were taken you will be contacted by phone or by letter within the next 1-3 weeks.  Please call us at [phone] if you have not heard about the biopsies in 3 weeks.   Thank you for allowing [place] to provide for your healthcare needs today.   SIGNATURES/CONFIDENTIALITY: You and/or your care partner have signed paperwork which will be entered into your electronic medical record.  These signatures attest to the fact that that the information above on your After Visit Summary has been reviewed and is understood.  Full responsibility of the confidentiality of this discharge information lies with you and/or your care-partner.

## 2018-07-30 NOTE — Op Note (Addendum)
[facility] [place] Patient Name: Zymarion Favorite Procedure Date: 07/30/2018 3:12 PM MRN: [id number] Endoscopist: Mauri Pole , MD Age: 67 Referring MD:  Date of Birth: Feb 12, 1951 Gender: Male Account #: [id number] Procedure:                Colonoscopy Indications:              High risk colon cancer surveillance: Personal                            history of colonic polyps, High risk colon cancer                            surveillance: Personal history of adenoma less than                            10 mm in size, Last colonoscopy: 2013 Medicines:                Monitored Anesthesia Care Procedure:                Pre-Anesthesia Assessment:                           - Prior to the procedure, a History and Physical                            was performed, and patient medications and                            allergies were reviewed. The patient's tolerance of                            previous anesthesia was also reviewed. The risks                            and benefits of the procedure and the sedation                            options and risks were discussed with the patient.                            All questions were answered, and informed consent                            was obtained. Prior Anticoagulants: The patient                            last took Plavix (clopidogrel) 5 days prior to the                            procedure. ASA Grade Assessment: III - A patient                            with severe systemic disease. After reviewing the  risks and benefits, the patient was deemed in                            satisfactory condition to undergo the procedure.                           After obtaining informed consent, the colonoscope                            was passed under direct vision. Throughout the                            procedure, the patient's blood pressure, pulse, and                            oxygen saturations  were monitored continuously. The                            Model PCF-[id number]) scope was introduced                            through the anus and advanced to the the cecum,                            identified by appendiceal orifice and ileocecal                            valve. The colonoscopy was performed without                            difficulty. The patient tolerated the procedure                            well. The quality of the bowel preparation was                            excellent. The ileocecal valve, appendiceal                            orifice, and rectum were photographed. Scope In: 3:28:50 PM Scope Out: 3:48:40 PM Scope Withdrawal Time: 0 hours 14 minutes 18 seconds  Total Procedure Duration: 0 hours 19 minutes 50 seconds  Findings:                 The perianal and digital rectal examinations were                            normal.                           A 14 mm polyp was found in the proximal ascending                            colon. The polyp was granular lateral spreading.  Polypectomy was attempted, initially using a cold                            snare. Polyp resection was incomplete with this                            device. This intervention then required a different                            device and polypectomy technique. The residula                            polyp at the edge was removed using a cold biopsy                            forceps. Resection and retrieval were complete.                           Multiple small and large-mouthed diverticula were                            found in the sigmoid colon, transverse colon and                            ascending colon.                           Non-bleeding internal hemorrhoids were found during                            retroflexion. The hemorrhoids were small. Complications:            No immediate complications. Estimated Blood Loss:      Estimated blood loss was minimal. Impression:               - One 14 mm polyp in the proximal ascending colon,                            removed with a cold biopsy forceps. Resected and                            retrieved.                           - Severe diverticulosis in the sigmoid colon, in                            the transverse colon and in the ascending colon.                           - Non-bleeding internal hemorrhoids. Recommendation:           - Patient has a contact number available for                            emergencies.  The signs and symptoms of potential                            delayed complications were discussed with the                            patient. Return to normal activities tomorrow.                            Written discharge instructions were provided to the                            patient.                           - Resume previous diet.                           - Continue present medications.                           - Await pathology results.                           - Repeat colonoscopy in 3 years for surveillance                            based on pathology results.                           - Resume Plavix (clopidogrel) at prior dose in 3                            days. Refer to managing physician for further                            adjustment of therapy.                           - No aspirin, ibuprofen, naproxen, or other                            non-steroidal anti-inflammatory drugs for 2 weeks. Mauri Pole, MD 07/30/2018 3:54:03 PM This report has been signed electronically.

## 2018-08-02 ENCOUNTER — Telehealth: Payer: Self-pay | Admitting: *Deleted

## 2018-08-02 ENCOUNTER — Encounter: Payer: Self-pay | Admitting: Gastroenterology

## 2018-08-02 NOTE — Telephone Encounter (Signed)
Left message on f/u call 

## 2018-08-02 NOTE — Telephone Encounter (Signed)
  Follow up Call-  Call back number 07/30/2018  Post procedure Call Back phone  # [phone]  Permission to leave phone message Yes  Some recent data might be hidden     Patient questions:  Do you have a fever, pain , or abdominal swelling? No. Pain Score  0 *  Have you tolerated food without any problems? Yes.    Have you been able to return to your normal activities? Yes.    Do you have any questions about your discharge instructions: Diet   No. Medications  No. Follow up visit  No.  Do you have questions or concerns about your Care? No.  Actions: * If pain score is 4 or above: No action needed, pain <4.

## 2018-08-04 ENCOUNTER — Encounter: Payer: Self-pay | Admitting: Gastroenterology

## 2018-08-08 ENCOUNTER — Other Ambulatory Visit: Payer: Self-pay | Admitting: Family Medicine

## 2018-08-09 NOTE — Telephone Encounter (Signed)
Pt has scheduled an appointment for this Saturday for refills. (11/23/)  He has enough Plavix to last until this appointment.  Please review this medication at visit.  Appointment with Dr. Sheppard Coil

## 2018-08-14 ENCOUNTER — Encounter: Payer: Self-pay | Admitting: Osteopathic Medicine

## 2018-08-14 ENCOUNTER — Ambulatory Visit (INDEPENDENT_AMBULATORY_CARE_PROVIDER_SITE_OTHER): Payer: 59 | Admitting: Osteopathic Medicine

## 2018-08-14 ENCOUNTER — Other Ambulatory Visit: Payer: Self-pay

## 2018-08-14 VITALS — BP 130/77 | HR 96 | Temp 98.1°F | Ht 69.29 in | Wt 248.4 lb

## 2018-08-14 DIAGNOSIS — Z6838 Body mass index (BMI) 38.0-38.9, adult: Secondary | ICD-10-CM

## 2018-08-14 DIAGNOSIS — E559 Vitamin D deficiency, unspecified: Secondary | ICD-10-CM | POA: Diagnosis not present

## 2018-08-14 DIAGNOSIS — Z8639 Personal history of other endocrine, nutritional and metabolic disease: Secondary | ICD-10-CM

## 2018-08-14 DIAGNOSIS — G4726 Circadian rhythm sleep disorder, shift work type: Secondary | ICD-10-CM

## 2018-08-14 DIAGNOSIS — I7774 Dissection of vertebral artery: Secondary | ICD-10-CM

## 2018-08-14 DIAGNOSIS — E119 Type 2 diabetes mellitus without complications: Secondary | ICD-10-CM | POA: Diagnosis not present

## 2018-08-14 DIAGNOSIS — B354 Tinea corporis: Secondary | ICD-10-CM | POA: Diagnosis not present

## 2018-08-14 MED ORDER — ONDANSETRON 4 MG PO TBDP: 4.0000 mg | ORAL_TABLET | Freq: Three times a day (TID) | ORAL | 1 refills | Status: DC | PRN

## 2018-08-14 MED ORDER — CLOPIDOGREL BISULFATE 75 MG PO TABS: 75.0000 mg | ORAL_TABLET | Freq: Every day | ORAL | 0 refills | Status: DC

## 2018-08-14 MED ORDER — CLOTRIMAZOLE 1 % EX CREA: 1.0000 "application " | TOPICAL_CREAM | Freq: Two times a day (BID) | CUTANEOUS | 0 refills | Status: AC

## 2018-08-14 MED ORDER — PRAVASTATIN SODIUM 40 MG PO TABS: 40.0000 mg | ORAL_TABLET | Freq: Every day | ORAL | 0 refills | Status: AC

## 2018-08-14 MED ORDER — MECLIZINE HCL 25 MG PO TABS: 25.0000 mg | ORAL_TABLET | Freq: Three times a day (TID) | ORAL | 1 refills | Status: AC | PRN

## 2018-08-14 MED ORDER — GLIMEPIRIDE 4 MG PO TABS: 4.0000 mg | ORAL_TABLET | Freq: Every day | ORAL | 0 refills | Status: DC

## 2018-08-14 MED ORDER — METFORMIN HCL 1000 MG PO TABS: 1000.0000 mg | ORAL_TABLET | Freq: Two times a day (BID) | ORAL | 0 refills | Status: AC

## 2018-08-14 MED ORDER — SUVOREXANT 15 MG PO TABS: 15.0000 mg | ORAL_TABLET | Freq: Every evening | ORAL | 0 refills | Status: DC | PRN

## 2018-08-14 NOTE — Progress Notes (Signed)
HPI: Kirk Oneal is a 67 y.o. 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  he [facility] to [facility] today, 08/14/18,  for chief complaint of:  Med refills - see headings    Last visit with Dr. Brigitte Pulse 12/19/2017, notes reviewed.  He was following for chronic fatigue, advised to follow-up in 3 months around June 2019 but does not look like he has done so.  Now off of medications and needs refills.  He does have an appointment with Dr. Brigitte Pulse upcoming 09/16/2018  Patient is here today reporting need for refills on medications.  History of vertebral artery dissection and needs refill on Plavix.  No easy bruising or bleeding.  History of type 2 diabetes: Needs refill on glimepiride and metformin.  Last A1c check was some time ago, was 7% 10/2017.   High cholesterol/diabetes: Needs refill on pravastatin  Sleep disorder: Taking Belsomra  Occasional nausea, requests refill on meclizine and Zofran.  Rash L arm  . Location: Left forearm only . Quality: Hypopigmented patches . Duration: 2 months . Modifying factors: Not tried any over-the-counter medications or home remedies for this . Assoc signs/symptoms: no itching or ulceration        Past medical history, surgical history, and family history reviewed.  Current medication list and allergy/intolerance information reviewed.   (See remainder of HPI, ROS, Phys Exam below)    ASSESSMENT/PLAN: The primary encounter diagnosis was Tinea corporis. Diagnoses of Dissection of vertebral artery (HCC), Class 2 severe obesity due to excess calories with serious comorbidity and body mass index (BMI) of 38.0 to 38.9 in adult [facility]), Diabetes mellitus without complication ([place]), Vitamin D deficiency, History of non anemic vitamin B12  deficiency, and Sleep disorder, shift work were also pertinent to this visit.   Meds ordered this encounter  Medications  . clopidogrel (PLAVIX) 75 MG tablet    Sig: Take 1 tablet (75 mg total) by mouth daily.    Dispense:  90 tablet    Refill:  0  . glimepiride (AMARYL) 4 MG tablet    Sig: Take 1 tablet (4 mg total) by mouth daily. with food    Dispense:  90 tablet    Refill:  0    Pt needs office visit  . meclizine (ANTIVERT) 25 MG tablet    Sig: Take 1-2 tablets (25-50 mg total) by mouth 3 (three) times daily as needed for dizziness.    Dispense:  30 tablet    Refill:  1  . metFORMIN (GLUCOPHAGE) 1000 MG tablet    Sig: Take 1 tablet (1,000 mg total) by mouth 2 (two) times daily with a meal.    Dispense:  180 tablet    Refill:  0  . ondansetron (ZOFRAN ODT) 4 MG disintegrating tablet    Sig: Take 1 tablet (4 mg total) by mouth every 8 (eight) hours as needed for nausea or vomiting.    Dispense:  20 tablet    Refill:  1  . pravastatin (PRAVACHOL) 40 MG tablet    Sig: Take 1 tablet (40 mg total) by mouth daily.    Dispense:  90 tablet    Refill:  0  . Suvorexant (BELSOMRA) 15 MG TABS    Sig: Take 15 mg by mouth at bedtime as needed.    Dispense:  30 tablet  Refill:  0  . clotrimazole (LOTRIMIN) 1 % cream    Sig: Apply 1 application topically 2 (two) times daily. To affected skin - continue for one week after resolution of rash    Dispense:  45 g    Refill:  0   Orders Placed This Encounter  Procedures  . CBC  . CMP14+EGFR  . Lipid panel  . VITAMIN D 25 Hydroxy (Vit-D Deficiency, Fractures)  . Vitamin B12  . Microalbumin / creatinine urine ratio  . Hemoglobin A1c             Follow-up plan: Return for Keep currently scheduled appointment with Dr. Brigitte Pulse to maintain refills, monitor chronic  conditions.                                      ############################################ ############################################ ############################################ ############################################    Outpatient Encounter Medications as of 08/14/2018  Medication Sig  . clopidogrel (PLAVIX) 75 MG tablet Take 1 tablet (75 mg total) by mouth daily.  . Cyanocobalamin (VITAMIN B-12 PO) Take 1 tablet by mouth daily.  Marland Kitchen glimepiride (AMARYL) 4 MG tablet TAKE 1 TABLET (4 MG TOTAL) BY MOUTH DAILY. WITH FOOD  . meloxicam (MOBIC) 15 MG tablet Take 1 tablet (15 mg total) by mouth daily as needed for pain.  . metFORMIN (GLUCOPHAGE) 1000 MG tablet Take 1 tablet (1,000 mg total) by mouth 2 (two) times daily with a meal.  . ondansetron (ZOFRAN ODT) 4 MG disintegrating tablet Take 1 tablet (4 mg total) by mouth every 8 (eight) hours as needed for nausea or vomiting.  . pravastatin (PRAVACHOL) 40 MG tablet TAKE 1 TABLET BY MOUTH EVERY DAY  . Suvorexant (BELSOMRA) 15 MG TABS Take 15 mg by mouth at bedtime as needed.  . Vitamin D, Ergocalciferol, (DRISDOL) 50000 units CAPS capsule TAKE 1 CAPSULE (50,000 UNITS TOTAL) BY MOUTH EVERY 7 (SEVEN) DAYS.  Marland Kitchen meclizine (ANTIVERT) 25 MG tablet Take 1-2 tablets (25-50 mg total) by mouth 3 (three) times daily as needed for dizziness. (Patient not taking: Reported on 08/14/2018)  . [DISCONTINUED] ezetimibe (ZETIA) 10 MG tablet Take 1 tablet (10 mg total) by mouth daily. (Patient not taking: Reported on 09/17/2015)   Facility-Administered Encounter Medications as of 08/14/2018  Medication  . 0.9 %  sodium chloride infusion   No Known Allergies    Review of Systems:  Constitutional: No recent illness  HEENT: No  headache, no vision change  Cardiac: No  chest pain, No  pressure, No palpitations  Respiratory:  No  shortness of breath. No  Cough  Gastrointestinal: No  abdominal pain, no change on  bowel habits  Musculoskeletal: No new myalgia/arthralgia  Skin: +Rash  Hem/Onc: No  easy bruising/bleeding, No  abnormal lumps/bumps  Neurologic: No  weakness, No  Dizziness  Psychiatric: No  concerns with depression, No  concerns with anxiety  Exam:  BP 130/77   Pulse 96   Temp 98.1 F (36.7 C) (Oral)   Ht 5' 9.29" (1.76 m)   Wt 248 lb 6.4 oz (112.7 kg)   SpO2 96%   BMI 36.37 kg/m   Constitutional: VS see above. General Appearance: alert, well-developed, well-nourished, NAD  Eyes: Normal lids and conjunctive, non-icteric sclera  Ears, Nose, Mouth, Throat: MMM, Normal external inspection ears/nares/mouth/lips/gums.  Neck: No masses, trachea midline.   Respiratory: Normal respiratory effort. no wheeze, no rhonchi, no rales  Cardiovascular: S1/S2 normal, no  murmur, no rub/gallop auscultated. RRR.   Musculoskeletal: Gait normal. Symmetric and independent movement of all extremities  Neurological: Normal balance/coordination. No tremor.  Skin: warm, dry, intact.  Hypopigmented patches on left forearm  Psychiatric: Normal judgment/insight. Normal mood and affect. Oriented x3.   Visit summary with medication list and pertinent instructions was printed for patient to review, advised to alert [place] if any changes needed. All questions at time of visit were answered - patient instructed to contact office with any additional concerns. ER/RTC precautions were reviewed with the patient and understanding verbalized.   Follow-up plan: Return for Keep currently scheduled appointment with Dr. Brigitte Pulse to maintain refills, monitor chronic conditions.     Please note: voice recognition software was used to produce this document, and typos may escape review. Please contact Dr. Sheppard Coil for any needed clarifications.

## 2018-08-14 NOTE — Patient Instructions (Signed)
° ° ° °  If you have lab work done today you will be contacted with your lab results within the next 2 weeks.  If you have not heard from us then please contact us. The fastest way to get your results is to register for My Chart. ° ° °IF you received an x-ray today, you will receive an invoice from [facility]. Please contact [facility] at [phone] with questions or concerns regarding your invoice.  ° °IF you received labwork today, you will receive an invoice from [facility]. Please contact [facility] at [phone] with questions or concerns regarding your invoice.  ° °Our billing staff will not be able to assist you with questions regarding bills from these companies. ° °You will be contacted with the lab results as soon as they are available. The fastest way to get your results is to activate your My Chart account. Instructions are located on the last page of this paperwork. If you have not heard from us regarding the results in 2 weeks, please contact this office. °  ° ° ° °

## 2018-08-15 LAB — MICROALBUMIN / CREATININE URINE RATIO
CREATININE, UR: 219.1 mg/dL
MICROALB/CREAT RATIO: 2 mg/g{creat} (ref 0.0–30.0)
Microalbumin, Urine: 4.4 ug/mL

## 2018-08-16 ENCOUNTER — Encounter: Payer: Self-pay | Admitting: *Deleted

## 2018-08-16 LAB — CMP14+EGFR
ALK PHOS: 67 IU/L (ref 39–117)
ALT: 34 IU/L (ref 0–44)
AST: 22 IU/L (ref 0–40)
Albumin/Globulin Ratio: 2 (ref 1.2–2.2)
Albumin: 4.6 g/dL (ref 3.6–4.8)
BUN/Creatinine Ratio: 14 (ref 10–24)
BUN: 14 mg/dL (ref 8–27)
CHLORIDE: 101 mmol/L (ref 96–106)
CO2: 21 mmol/L (ref 20–29)
Calcium: 10 mg/dL (ref 8.6–10.2)
Creatinine, Ser: 1.01 mg/dL (ref 0.76–1.27)
GFR calc Af Amer: 89 mL/min/{1.73_m2} (ref >59)
GFR calc non Af Amer: 77 mL/min/{1.73_m2} (ref >59)
GLUCOSE: 137 mg/dL — AB (ref 65–99)
Globulin, Total: 2.3 g/dL (ref 1.5–4.5)
Potassium: 4.3 mmol/L (ref 3.5–5.2)
SODIUM: 140 mmol/L (ref 134–144)
Total Protein: 6.9 g/dL (ref 6.0–8.5)

## 2018-08-16 LAB — CBC
HEMOGLOBIN: 13.4 g/dL (ref 13.0–17.7)
Hematocrit: 39.5 % (ref 37.5–51.0)
MCH: 31.5 pg (ref 26.6–33.0)
MCHC: 33.9 g/dL (ref 31.5–35.7)
MCV: 93 fL (ref 79–97)
Platelets: 381 10*3/uL (ref 150–450)
RBC: 4.25 x10E6/uL (ref 4.14–5.80)
RDW: 13 % (ref 12.3–15.4)
WBC: 6.1 10*3/uL (ref 3.4–10.8)

## 2018-08-16 LAB — LIPID PANEL
CHOLESTEROL TOTAL: 158 mg/dL (ref 100–199)
Chol/HDL Ratio: 3.9 ratio (ref 0.0–5.0)
HDL: 41 mg/dL (ref >39)
LDL Calculated: 82 mg/dL (ref 0–99)
TRIGLYCERIDES: 173 mg/dL — AB (ref 0–149)
VLDL CHOLESTEROL CAL: 35 mg/dL (ref 5–40)

## 2018-08-16 LAB — HEMOGLOBIN A1C
ESTIMATED AVERAGE GLUCOSE: 146 mg/dL
HEMOGLOBIN A1C: 6.7 % — AB (ref 4.8–5.6)

## 2018-08-16 LAB — VITAMIN B12: VITAMIN B 12: 313 pg/mL (ref 232–1245)

## 2018-08-16 LAB — VITAMIN D 25 HYDROXY (VIT D DEFICIENCY, FRACTURES): Vit D, 25-Hydroxy: 33.1 ng/mL (ref 30.0–100.0)

## 2018-08-28 ENCOUNTER — Other Ambulatory Visit: Payer: Self-pay | Admitting: Family Medicine

## 2018-08-30 NOTE — Telephone Encounter (Signed)
Requested medication (s) are due for refill today: yes  Requested medication (s) are on the active medication list: yes  Last refill:  02/16/18  Future visit scheduled: yes  Notes to clinic:  Routing to clinic to approve This med was not delegated for NT to refill   Requested Prescriptions  Pending Prescriptions Disp Refills   Vitamin D, Ergocalciferol, (DRISDOL) 1.25 MG (50000 UT) CAPS capsule [Pharmacy Med Name: VITAMIN D2 1.25MG (50,000 UNIT)] 24 capsule 0    Sig: TAKE 1 CAPSULE (50,000 UNITS TOTAL) BY MOUTH EVERY 7 (SEVEN) DAYS.     Endocrinology:  Vitamins - Vitamin D Supplementation Failed - 08/28/2018  9:00 AM      Failed - 50,000 IU strengths are not delegated      Failed - Phosphate in normal range and within 360 days    No results found for: PHOS       Passed - Ca in normal range and within 360 days    Calcium  Date Value Ref Range Status  08/14/2018 10.0 8.6 - 10.2 mg/dL Final         Passed - Vit D in normal range and within 360 days    Vit D, 25-Hydroxy  Date Value Ref Range Status  08/14/2018 33.1 30.0 - 100.0 ng/mL Final    Comment:    Vitamin D deficiency has been defined by the Institute of Medicine and an Endocrine Society practice guideline as a level of serum 25-OH vitamin D less than 20 ng/mL (1,2). The Endocrine Society went on to further define vitamin D insufficiency as a level between 21 and 29 ng/mL (2). 1. IOM (Institute of Medicine). 2010. Dietary reference    intakes for calcium and D. [place]: The    [employer]. 2. Holick MF, Binkley [place], Bischoff-Ferrari HA, et al.    Evaluation, treatment, and prevention of vitamin D    deficiency: an Endocrine Society clinical practice    guideline. JCEM. 2011 Jul; 96(7):1911-30.          Passed - Valid encounter within last 12 months    Recent Outpatient Visits          2 weeks ago Tinea corporis   [facility] [place], [place], DO   8 months ago Chronic fatigue  syndrome   [facility], Laurey Arrow, MD   10 months ago Diabetes mellitus without complication [facility])   [facility], Laurey Arrow, MD   1 year ago Chronic fatigue   [facility], Laurey Arrow, MD   1 year ago Diabetes mellitus without complication [facility])   [facility], Laurey Arrow, MD      Future Appointments            In 1 month [name], MD [facility] [place], [facility]

## 2018-09-16 ENCOUNTER — Ambulatory Visit: Payer: Self-pay | Admitting: Family Medicine

## 2018-09-24 ENCOUNTER — Ambulatory Visit: Payer: Medicare Other | Admitting: Family Medicine

## 2018-09-29 ENCOUNTER — Telehealth: Payer: Self-pay | Admitting: Family Medicine

## 2018-09-29 NOTE — Telephone Encounter (Signed)
LVM for pt in regards to the appt scheduled with Dr. Brigitte Pulse on 09/30/18. Due to Dr. Brigitte Pulse being on leave, the pt will need to be rescheduled. When pt calls back, please reschedule at their convenience. Pt can either be scheduled at Dr. Manya Silvas first available - (push towards the end of February) or with a different provider at their first available. Thank you!

## 2018-10-02 ENCOUNTER — Ambulatory Visit: Payer: Medicare Other | Admitting: Family Medicine

## 2018-10-14 ENCOUNTER — Telehealth: Payer: Self-pay | Admitting: Family Medicine

## 2018-10-14 NOTE — Telephone Encounter (Signed)
LVM for pt to call the office and reschedule appt that was scheduled with Dr. Brigitte Pulse on 10/25/18. Due to Dr. Brigitte Pulse being out on emergency leave, pt can either schedule with a different provider or they may schedule with Dr. Brigitte Pulse in mid March. When pt calls back, please schedule accordingly. Thank you!

## 2018-10-25 ENCOUNTER — Ambulatory Visit: Payer: 59 | Admitting: Family Medicine

## 2018-11-01 DIAGNOSIS — M5432 Sciatica, left side: Secondary | ICD-10-CM | POA: Diagnosis not present

## 2018-11-12 ENCOUNTER — Other Ambulatory Visit: Payer: Self-pay | Admitting: Osteopathic Medicine

## 2018-11-17 ENCOUNTER — Other Ambulatory Visit: Payer: Self-pay | Admitting: Osteopathic Medicine

## 2018-11-22 ENCOUNTER — Telehealth: Payer: Self-pay | Admitting: Family Medicine

## 2018-11-22 NOTE — Telephone Encounter (Signed)
Tried to call patient to let them know Dr. Shaw is no longer at [facility] [place] and that their appointment with her is going to be cancelled. ° °LVM for patient, if patient calls back, please try to get them rescheduled with a different provider or let them know that Dr Shaw is going to be working at [facility] the number there is [phone]. °

## 2018-11-26 ENCOUNTER — Other Ambulatory Visit: Payer: Self-pay | Admitting: Family Medicine

## 2018-11-26 NOTE — Telephone Encounter (Signed)
Requested medication (s) are due for refill today -yes  Requested medication (s) are on the active medication list -yes  Future visit scheduled -no  Last refill: 08/14/18  Notes to clinic: Patient is requesting a medication that passes protocol- but filled by prn provider. Patient does not have follow up appointment- has been called and left message by office a couple days ago. Sent for PCP review  Requested Prescriptions  Pending Prescriptions Disp Refills   glimepiride (AMARYL) 4 MG tablet [Pharmacy Med Name: GLIMEPIRIDE 4 MG TABLET] 90 tablet 0    Sig: TAKE 1 TABLET EVERY DAY WITH FOOD. NEED OFFICE VISIT     Endocrinology:  Diabetes - Sulfonylureas Passed - 11/26/2018  2:01 PM      Passed - HBA1C is between 0 and 7.9 and within 180 days    Hgb A1c MFr Bld  Date Value Ref Range Status  08/14/2018 6.7 (H) 4.8 - 5.6 % Final    Comment:             Prediabetes: 5.7 - 6.4          Diabetes: >6.4          Glycemic control for adults with diabetes: <7.0          Passed - Valid encounter within last 6 months    Recent Outpatient Visits          3 months ago Tinea corporis   [facility] [place], [place], DO   11 months ago Chronic fatigue syndrome   [facility], Laurey Arrow, MD   1 year ago Diabetes mellitus without complication [facility])   [facility], Laurey Arrow, MD   1 year ago Chronic fatigue   [facility], Laurey Arrow, MD   1 year ago Diabetes mellitus without complication [facility])   [facility], Laurey Arrow, MD              Requested Prescriptions  Pending Prescriptions Disp Refills   glimepiride (AMARYL) 4 MG tablet [Pharmacy Med Name: GLIMEPIRIDE 4 MG TABLET] 90 tablet 0    Sig: TAKE 1 TABLET EVERY DAY WITH FOOD. NEED OFFICE VISIT     Endocrinology:  Diabetes - Sulfonylureas Passed - 11/26/2018  2:01 PM      Passed - HBA1C is between 0 and 7.9 and within 180 days    Hgb A1c MFr Bld  Date Value Ref Range Status   08/14/2018 6.7 (H) 4.8 - 5.6 % Final    Comment:             Prediabetes: 5.7 - 6.4          Diabetes: >6.4          Glycemic control for adults with diabetes: <7.0          Passed - Valid encounter within last 6 months    Recent Outpatient Visits          3 months ago Tinea corporis   [facility] [place], [place], DO   11 months ago Chronic fatigue syndrome   [facility], Laurey Arrow, MD   1 year ago Diabetes mellitus without complication [facility])   [facility], Laurey Arrow, MD   1 year ago Chronic fatigue   [facility], Laurey Arrow, MD   1 year ago Diabetes mellitus without complication [facility])   [facility]  [name], MD

## 2018-11-26 NOTE — Telephone Encounter (Signed)
Pt is out of this med, he made the 1st available appt for [facility] on 4/27.  He    Pharmacy-    [id number] - [place], [place] [phone] (Phone)

## 2018-12-14 ENCOUNTER — Ambulatory Visit: Payer: 59 | Admitting: Family Medicine

## 2018-12-27 ENCOUNTER — Other Ambulatory Visit: Payer: Self-pay | Admitting: Family Medicine

## 2018-12-27 NOTE — Telephone Encounter (Signed)
Requested Prescriptions  Pending Prescriptions Disp Refills  . glimepiride (AMARYL) 4 MG tablet [Pharmacy Med Name: GLIMEPIRIDE 4 MG TABLET] 30 tablet 0    Sig: TAKE 1 TABLET EVERY DAY WITH FOOD. NEED OFFICE VISIT     Endocrinology:  Diabetes - Sulfonylureas Passed - 12/27/2018  1:20 AM      Passed - HBA1C is between 0 and 7.9 and within 180 days    Hgb A1c MFr Bld  Date Value Ref Range Status  08/14/2018 6.7 (H) 4.8 - 5.6 % Final    Comment:             Prediabetes: 5.7 - 6.4          Diabetes: >6.4          Glycemic control for adults with diabetes: <7.0          Passed - Valid encounter within last 6 months    Recent Outpatient Visits          4 months ago Tinea corporis   [facility] [place], [place], DO   1 year ago Chronic fatigue syndrome   [facility], Laurey Arrow, MD   1 year ago Diabetes mellitus without complication [facility])   [facility], Laurey Arrow, MD   1 year ago Chronic fatigue   [facility], Laurey Arrow, MD   1 year ago Diabetes mellitus without complication [facility])   [facility], Laurey Arrow, MD      Future Appointments            In 3 weeks Forrest Moron, MD [facility] [place], [facility]

## 2019-01-04 ENCOUNTER — Telehealth: Payer: Self-pay | Admitting: Family Medicine

## 2019-01-04 NOTE — Telephone Encounter (Signed)
Copied from [place] [phone]. Topic: General - Inquiry >> Jan 04, 2019  3:06 PM Rutherford Nail, NT wrote: Reason for CRM: Patient calling and would like to know if Dr Bridget Hartshorn could write him a note to be out of work a couple weeks. States that he is over 4, has diabetes, and PTSD. States that his job is not doing well with social distancing and he is worried with his age and diagnosis that he could contract COVID 6. Please advise.  CB#: [phone]

## 2019-01-11 ENCOUNTER — Other Ambulatory Visit: Payer: Self-pay | Admitting: Osteopathic Medicine

## 2019-01-14 ENCOUNTER — Other Ambulatory Visit: Payer: Self-pay

## 2019-01-14 DIAGNOSIS — E785 Hyperlipidemia, unspecified: Secondary | ICD-10-CM

## 2019-01-14 DIAGNOSIS — E119 Type 2 diabetes mellitus without complications: Secondary | ICD-10-CM

## 2019-01-17 ENCOUNTER — Telehealth (INDEPENDENT_AMBULATORY_CARE_PROVIDER_SITE_OTHER): Payer: 59 | Admitting: Family Medicine

## 2019-01-17 ENCOUNTER — Other Ambulatory Visit: Payer: Self-pay

## 2019-01-17 DIAGNOSIS — E0865 Diabetes mellitus due to underlying condition with hyperglycemia: Secondary | ICD-10-CM

## 2019-01-17 DIAGNOSIS — E785 Hyperlipidemia, unspecified: Secondary | ICD-10-CM | POA: Diagnosis not present

## 2019-01-17 DIAGNOSIS — Z125 Encounter for screening for malignant neoplasm of prostate: Secondary | ICD-10-CM

## 2019-01-17 DIAGNOSIS — I7774 Dissection of vertebral artery: Secondary | ICD-10-CM | POA: Diagnosis not present

## 2019-01-17 DIAGNOSIS — Z7189 Other specified counseling: Secondary | ICD-10-CM | POA: Diagnosis not present

## 2019-01-17 NOTE — Progress Notes (Signed)
CC: transfer of care(former Kirk Oneal).  Pt has concerns about dx of PTSD and Diabetes as he knows individual with DM are at risk concerning the corona virus and he hasn't been to work in a couple of weeks and doesn't fee comfortable returning w/o the Governor lifting the restriction and wants to discuss with Dr. Nolon Rod.  Pt needs refills on glimepiride and clopidogrel.  No travel outside the [place] or [place] in the past 3 weeks.

## 2019-01-17 NOTE — Progress Notes (Signed)
Telemedicine Encounter- SOAP NOTE Established Patient  This telephone encounter was conducted with the patient's (or proxy's) verbal consent via audio telecommunications: yes/no: Yes Patient was instructed to have this encounter in a suitably private space; and to only have persons present to whom they give permission to participate. In addition, patient identity was confirmed by use of name plus two identifiers (DOB and address).  I discussed the limitations, risks, security and privacy concerns of performing an evaluation and management service by telephone and the availability of in person appointments. I also discussed with the patient that there may be a patient responsible charge related to this service. The patient expressed understanding and agreed to proceed.  I spent a total of TIME; 0 MIN TO 60 MIN: 25 minutes talking with the patient or their proxy.  CC: DIABETES AND COVID  Subjective   Kirk Oneal is a 68 y.o. established patient. 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 Depression screen [facility] 2/9 01/17/2019 08/14/2018 10/29/2017 08/15/2017 07/20/2017  Decreased Interest 1 0 0 0 0  Down, Depressed, Hopeless 1 0 0 0 0  PHQ - 2 Score 2 0 0 0 0  Altered sleeping 1 - - - -  Tired, decreased energy 1 - - - -  Change in appetite 0 - - - -  Feeling bad or failure about yourself  0 - - - -  Trouble concentrating 0 - - - -  Moving slowly or fidgety/restless 0 - - - -  Suicidal thoughts 0 - - - -  PHQ-9 Score 4 - - - -  Difficult doing work/chores Somewhat difficult - - - -     He also has Diabetes Diabetes Mellitus: Patient presents for follow up of diabetes. Symptoms: none. Symptoms have stabilized. Patient denies foot ulcerations, hypoglycemia , increase appetite, nausea, paresthesia of the feet, polydipsia, polyuria and  visual disturbances.  Evaluation to date has been included: hemoglobin A1C.  Home sugars: patient does not check sugars.  He is on metformin and glimepiride  Lab Results  Component Value Date   HGBA1C 6.7 (H) 08/14/2018    He is concerned for COVID because of his age and chronic conditions. 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    Screening PSA Denies LUTS  No urinary frequent, no leakage of urine, no hesistance, no dribble No family history of prostate cancer He is African-American    Patient Active Problem List   Diagnosis Date Noted  . Chronic fatigue syndrome 01/08/2018  . 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 Vitamin D deficiency 01/08/2018  . Hyperlipidemia LDL goal <70 07/19/2017  . Obstructive sleep apnea 07/19/2017  . Obesity 07/19/2017  . Diabetes mellitus without complication ([place]) 21/30/8657  . Atherosclerosis 04/17/2014  . 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 Arthritis   . Colon polyps   . Diabetes mellitus without complication ([place])   . 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 Diverticulosis   . HLD (hyperlipidemia)   . Sleep apnea    CPAP  . Vitamin D  deficiency     Current Outpatient Medications  Medication Sig Dispense Refill  . clopidogrel (PLAVIX) 75 MG tablet TAKE 1 TABLET BY MOUTH EVERY DAY 90 tablet 0  . clotrimazole (LOTRIMIN) 1 % cream Apply 1 application topically 2 (two) times daily. To affected skin - continue for one week after resolution of rash 45 g 0  . Cyanocobalamin (VITAMIN B-12 PO) Take 1 tablet by mouth daily.    Marland Kitchen glimepiride (AMARYL) 4 MG tablet TAKE 1 TABLET EVERY DAY WITH FOOD. NEED OFFICE VISIT 30 tablet 0  . meclizine (ANTIVERT) 25 MG tablet Take 1-2 tablets (25-50 mg total) by mouth 3 (three)  times daily as needed for dizziness. 30 tablet 1  . meloxicam (MOBIC) 15 MG tablet Take 1 tablet (15 mg total) by mouth daily as needed for pain. 30 tablet 3  . metFORMIN (GLUCOPHAGE) 1000 MG tablet Take 1 tablet (1,000 mg total) by mouth 2 (two) times daily with a meal. 180 tablet 0  . pravastatin (PRAVACHOL) 40 MG tablet Take 1 tablet (40 mg total) by mouth daily. 90 tablet 0  . Suvorexant (BELSOMRA) 15 MG TABS Take 15 mg by mouth at bedtime as needed. 30 tablet 0  . Vitamin D, Ergocalciferol, (DRISDOL) 1.25 MG (50000 UT) CAPS capsule TAKE 1 CAPSULE (50,000 UNITS TOTAL) BY MOUTH EVERY 7 (SEVEN) DAYS. 24 capsule 0  . ondansetron (ZOFRAN ODT) 4 MG disintegrating tablet Take 1 tablet (4 mg total) by mouth every 8 (eight) hours as needed for nausea or vomiting. (Patient not taking: Reported on 01/17/2019) 20 tablet 1   No current facility-administered medications for this visit.     No Known Allergies  Social History   Socioeconomic History  . Marital status: Married    Spouse name: sharon  . Number of children: 2  . Years of education: college  . Highest education level: Not on file  Occupational History    Employer: [employer]  Social Needs  . Financial resource strain: Not on file  . Food insecurity:    Worry: Not on file    Inability: Not on file  . Transportation needs:    Medical: Not on file    Non-medical: Not on file  Tobacco Use  . Smoking status: Never Smoker  . Smokeless tobacco: Never Used  Substance and Sexual Activity  . Alcohol use: Yes    Alcohol/week: 0.0 standard drinks    Comment: occasional  . Drug use: No  . Sexual activity: Yes  Lifestyle  . Physical activity:    Days per week: Not on file    Minutes per session: Not on file  . Stress: Not on file  Relationships  . Social connections:    Talks on phone: Not on file    Gets together: Not on file    Attends religious service: Not on file    Active member of club or organization: Not on file     Attends meetings of clubs or organizations: Not on file    Relationship status: Not on file  . Intimate partner violence:    Fear of current or ex partner: Not on file    Emotionally abused: Not on file    Physically abused: Not on file    Forced sexual activity: Not on file  Other Topics Concern  . Not on file  Social History Narrative   Patient is right handed, resides with wife   1cup of coffee a day     ROS Review  of Systems  Constitutional: Negative for activity change, appetite change, chills and fever.  HENT: Negative for congestion, nosebleeds, trouble swallowing and voice change.   Respiratory: Negative for cough, shortness of breath and wheezing.   Gastrointestinal: Negative for diarrhea, nausea and vomiting.  Genitourinary: Negative for difficulty urinating, dysuria, flank pain and hematuria.  Musculoskeletal: Negative for back pain, joint swelling and neck pain.  Neurological: Negative for dizziness, speech difficulty, light-headedness and numbness.  See HPI. All other review of systems negative.   Objective   Vitals as reported by the patient: There were no vitals filed for this visit.  Diagnoses and all orders for this visit:  Diabetes mellitus due to underlying condition with hyperglycemia, without long-term current use of insulin ([place])- discussed diabetes mgmt, avoidance of contact with others and continued diabetic diet  Dissection of vertebral artery [facility])- reviewed Neurology notes and CTA Continue Plavix Discussed that he should call Neurology to discuss monitoring  Hyperlipidemia LDL goal <70- continue statin  Educated About Covid-19 Virus Infection-  Provided work letter to allow social distancing and wearing ppe at work  Screening of malignant neoplasm of prostate- discussed PSA    I discussed the assessment and treatment plan with the patient. The patient was provided an opportunity to ask questions and all were answered. The patient agreed with the  plan and demonstrated an understanding of the instructions.   The patient was advised to call back or seek an in-person evaluation if the symptoms worsen or if the condition fails to improve as anticipated.  I provided 25 minutes of non-face-to-face time during this encounter.  Forrest Moron, MD  Primary Care at [facility]

## 2019-01-17 NOTE — Patient Instructions (Signed)
° ° ° °  If you have lab work done today you will be contacted with your lab results within the next 2 weeks.  If you have not heard from us then please contact us. The fastest way to get your results is to register for My Chart. ° ° °IF you received an x-ray today, you will receive an invoice from [facility]. Please contact [facility] at [phone] with questions or concerns regarding your invoice.  ° °IF you received labwork today, you will receive an invoice from [facility]. Please contact [facility] at [phone] with questions or concerns regarding your invoice.  ° °Our billing staff will not be able to assist you with questions regarding bills from these companies. ° °You will be contacted with the lab results as soon as they are available. The fastest way to get your results is to activate your My Chart account. Instructions are located on the last page of this paperwork. If you have not heard from us regarding the results in 2 weeks, please contact this office. °  ° ° ° °

## 2019-01-18 ENCOUNTER — Ambulatory Visit: Payer: 59

## 2019-01-18 ENCOUNTER — Other Ambulatory Visit: Payer: Self-pay

## 2019-01-18 DIAGNOSIS — Z125 Encounter for screening for malignant neoplasm of prostate: Secondary | ICD-10-CM

## 2019-01-18 DIAGNOSIS — E119 Type 2 diabetes mellitus without complications: Secondary | ICD-10-CM

## 2019-01-18 DIAGNOSIS — E785 Hyperlipidemia, unspecified: Secondary | ICD-10-CM

## 2019-01-19 LAB — CMP14+EGFR
ALT: 39 IU/L (ref 0–44)
AST: 32 IU/L (ref 0–40)
Albumin/Globulin Ratio: 2 (ref 1.2–2.2)
Albumin: 4.7 g/dL (ref 3.8–4.8)
Alkaline Phosphatase: 60 IU/L (ref 39–117)
BUN/Creatinine Ratio: 9 — ABNORMAL LOW (ref 10–24)
BUN: 9 mg/dL (ref 8–27)
Bilirubin Total: 0.3 mg/dL (ref 0.0–1.2)
CO2: 21 mmol/L (ref 20–29)
Calcium: 9.6 mg/dL (ref 8.6–10.2)
Chloride: 103 mmol/L (ref 96–106)
Creatinine, Ser: 1.05 mg/dL (ref 0.76–1.27)
GFR calc Af Amer: 85 mL/min/{1.73_m2} (ref >59)
GFR calc non Af Amer: 74 mL/min/{1.73_m2} (ref >59)
Globulin, Total: 2.4 g/dL (ref 1.5–4.5)
Glucose: 191 mg/dL — ABNORMAL HIGH (ref 65–99)
Potassium: 4.4 mmol/L (ref 3.5–5.2)
Sodium: 141 mmol/L (ref 134–144)
Total Protein: 7.1 g/dL (ref 6.0–8.5)

## 2019-01-19 LAB — LIPID PANEL
Chol/HDL Ratio: 3.9 ratio (ref 0.0–5.0)
Cholesterol, Total: 174 mg/dL (ref 100–199)
HDL: 45 mg/dL (ref >39)
LDL Calculated: 102 mg/dL — ABNORMAL HIGH (ref 0–99)
Triglycerides: 136 mg/dL (ref 0–149)
VLDL Cholesterol Cal: 27 mg/dL (ref 5–40)

## 2019-01-19 LAB — HEMOGLOBIN A1C
Est. average glucose Bld gHb Est-mCnc: 148 mg/dL
Hgb A1c MFr Bld: 6.8 % — ABNORMAL HIGH (ref 4.8–5.6)

## 2019-01-19 LAB — PSA: Prostate Specific Ag, Serum: 0.9 ng/mL (ref 0.0–4.0)

## 2019-01-20 NOTE — Progress Notes (Signed)
letter was mailed to pt

## 2019-01-25 ENCOUNTER — Telehealth: Payer: Self-pay | Admitting: Family Medicine

## 2019-01-25 ENCOUNTER — Other Ambulatory Visit: Payer: Self-pay

## 2019-01-25 DIAGNOSIS — E0865 Diabetes mellitus due to underlying condition with hyperglycemia: Secondary | ICD-10-CM

## 2019-01-25 MED ORDER — GLIMEPIRIDE 4 MG PO TABS: ORAL_TABLET | ORAL | 3 refills | Status: DC

## 2019-01-25 NOTE — Telephone Encounter (Signed)
Rx was set to pharmacy

## 2019-01-25 NOTE — Telephone Encounter (Signed)
Copied from [place] [phone] [phone]. Topic: Quick Communication - Rx Refill/Question >> Jan 25, 2019  4:24 PM Gustavus Messing wrote: Medication: glimepiride (AMARYL) 4 MG tablet [[id number]]   Has the patient contacted their pharmacy? No. (Agent: If no, request that the patient contact the pharmacy for the refill.)The patient's insurance only covers 85 day supplies so he needs it to be sent to the pharmacy again   Preferred Pharmacy (with phone number or street name): [id number] [address], [place] [phone] (Phone) [phone] (Fax)    Agent: Please be advised that RX refills may take up to 3 business days. We ask that you follow-up with your pharmacy.

## 2019-02-01 ENCOUNTER — Ambulatory Visit: Payer: Self-pay | Admitting: *Deleted

## 2019-02-01 NOTE — Telephone Encounter (Signed)
Pt left message on COVID voicemail 01/31/2019 at 1721 requesting information for COVID testing; he says that to his knowledge he has not been exposed; left message on voicemail [phone] for pt to call COVID line; when pt returns call please give him the following information:  INDICATIONS: Testing is generally indicated for a person who has a cough, fever, or shortness of breath AND any possible exposure to COVID-19. A healthcare provider needs to order the test.

## 2019-02-01 NOTE — Telephone Encounter (Signed)
Pt given information and verbalizes understanding; he is seen by Dr Delia Chimes, [place]; will route to office for notification.  Reason for Disposition . COVID-19 Testing, questions about  Protocols used: CORONAVIRUS (COVID-19) DIAGNOSED OR SUSPECTED-A-AH

## 2019-04-06 ENCOUNTER — Other Ambulatory Visit: Payer: Self-pay | Admitting: Family Medicine

## 2019-04-06 NOTE — Telephone Encounter (Signed)
Requested medication (s) are due for refill today: yes  Requested medication (s) are on the active medication list: yes  Last refill:  01/11/2019  Future visit scheduled: No  Notes to clinic:  Due office visit and labs    Requested Prescriptions  Pending Prescriptions Disp Refills   clopidogrel (PLAVIX) 75 MG tablet [Pharmacy Med Name: CLOPIDOGREL 75 MG TABLET] 90 tablet 0    Sig: TAKE 1 TABLET BY [place]     Hematology: Antiplatelets - clopidogrel Failed - 04/06/2019  1:20 AM      Failed - Evaluate AST, ALT within 2 months of therapy initiation.      Failed - HCT in normal range and within 180 days    Hematocrit  Date Value Ref Range Status  08/14/2018 39.5 37.5 - 51.0 % Final         Failed - HGB in normal range and within 180 days    Hemoglobin  Date Value Ref Range Status  08/14/2018 13.4 13.0 - 17.7 g/dL Final         Failed - PLT in normal range and within 180 days    Platelets  Date Value Ref Range Status  08/14/2018 381 150 - 450 x10E3/uL Final   Platelet Count, POC  Date Value Ref Range Status  08/15/2017 367 142 - 424 K/uL Final         Failed - Valid encounter within last 6 months    Recent Outpatient Visits          7 months ago Tinea corporis   [facility], [place], DO   1 year ago Chronic fatigue syndrome   [facility], Laurey Arrow, MD   1 year ago Diabetes mellitus without complication [facility])   [facility], Laurey Arrow, MD   1 year ago Chronic fatigue   [facility], Laurey Arrow, MD   1 year ago Diabetes mellitus without complication [facility])   [facility], Laurey Arrow, MD             Passed - ALT in normal range and within 360 days    ALT  Date Value Ref Range Status  01/18/2019 39 0 - 44 IU/L Final         Passed - AST in normal range and within 360 days    AST  Date Value Ref Range Status  01/18/2019 32 0 - 40 IU/L Final

## 2019-04-27 ENCOUNTER — Telehealth: Payer: Self-pay | Admitting: Family Medicine

## 2019-04-27 DIAGNOSIS — E0865 Diabetes mellitus due to underlying condition with hyperglycemia: Secondary | ICD-10-CM

## 2019-05-02 NOTE — Telephone Encounter (Signed)
Patient requesting call back from [facility] regarding this request. Patient states he is completely out of this medication.

## 2019-05-04 NOTE — Telephone Encounter (Signed)
Requested a call from [facility], called no answer on both lines, lm

## 2019-05-05 ENCOUNTER — Other Ambulatory Visit: Payer: Self-pay

## 2019-05-05 DIAGNOSIS — E0865 Diabetes mellitus due to underlying condition with hyperglycemia: Secondary | ICD-10-CM

## 2019-05-05 MED ORDER — GLIMEPIRIDE 4 MG PO TABS: ORAL_TABLET | ORAL | 0 refills | Status: DC

## 2019-05-05 NOTE — Progress Notes (Signed)
Approved glimepiride 4 mg tab #90 with ) refills.  Pt last seen in 12/2018 and schedule for upcoming appt with stalings 07/06/2019 at 4:20 pm. Dgaddy, CMA

## 2019-05-05 NOTE — Telephone Encounter (Signed)
Refilled glimepiride 4 mg #90 0 [facility] [address]  Notified pt of refill and schedule appt with provider for 07/06/2019 at 4:20 pm -pt will be due for a1c check at this time and med refills.  Advised pt if covid-19 flares up again it is possible we may call to reschedule appt as a telemed instead of office visit but we will notify him.  Pt agreeable. Dgaddy, CMA

## 2019-07-06 ENCOUNTER — Ambulatory Visit (INDEPENDENT_AMBULATORY_CARE_PROVIDER_SITE_OTHER): Payer: Medicare Other | Admitting: Family Medicine

## 2019-07-06 ENCOUNTER — Other Ambulatory Visit: Payer: Self-pay | Admitting: Family Medicine

## 2019-07-06 ENCOUNTER — Other Ambulatory Visit: Payer: Self-pay

## 2019-07-06 ENCOUNTER — Encounter: Payer: Self-pay | Admitting: Family Medicine

## 2019-07-06 VITALS — BP 137/86 | HR 94 | Temp 98.1°F | Ht 69.29 in | Wt 253.0 lb

## 2019-07-06 DIAGNOSIS — E1159 Type 2 diabetes mellitus with other circulatory complications: Secondary | ICD-10-CM | POA: Diagnosis not present

## 2019-07-06 DIAGNOSIS — E119 Type 2 diabetes mellitus without complications: Secondary | ICD-10-CM

## 2019-07-06 DIAGNOSIS — Z6837 Body mass index (BMI) 37.0-37.9, adult: Secondary | ICD-10-CM

## 2019-07-06 DIAGNOSIS — G4733 Obstructive sleep apnea (adult) (pediatric): Secondary | ICD-10-CM

## 2019-07-06 DIAGNOSIS — E785 Hyperlipidemia, unspecified: Secondary | ICD-10-CM | POA: Diagnosis not present

## 2019-07-06 DIAGNOSIS — G4726 Circadian rhythm sleep disorder, shift work type: Secondary | ICD-10-CM

## 2019-07-06 LAB — POCT GLYCOSYLATED HEMOGLOBIN (HGB A1C): Hemoglobin A1C: 6.8 % — AB (ref 4.0–5.6)

## 2019-07-06 NOTE — Progress Notes (Signed)
Established Patient Office Visit  Subjective:  Patient ID: Kirk Oneal, male    DOB: 02-12-1951  Age: 68 y.o. MRN: [id number]  CC:  Chief Complaint  Patient presents with  . Diabetes    wants to talk about the pravastatin, was told [facility] that he does not need that med. Also wants to talk about the suvorxant. Other care gaps are handle at the [facility] presents for   Diabetes Mellitus: Patient presents for follow up of diabetes. Symptoms: hyperglycemia. Symptoms have gradually improved. Patient denies hypoglycemia , increase appetite, nausea and paresthesia of the feet.  Evaluation to date has been included: hemoglobin A1C.  Home sugars: patient does not check sugars.  He is on statin but is not sure he needs it since his cholesterol was good.  Lab Results  Component Value Date   HGBA1C 6.8 (H) 01/18/2019   Insomnia/OSA He takes belsomra for his sleep issues He has retired and feels like his shift work sleep can improve He has OSA and uses a CPAP  Hyperlipidemia/Vertebral Dissection He has a history of verterbral artery dissection  Hyperlipidemia and diabetes His blood pressure is good and he is not a smoker He has not side effects of the statin or zetia,.  Past Medical History:  Diagnosis Date  . Arthritis   . Colon polyps   . Diabetes mellitus without complication ([place])   . 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 Diverticulosis   . HLD (hyperlipidemia)   . Sleep apnea    CPAP  . Vitamin D deficiency     Past Surgical History:  Procedure Laterality Date  . MENISCUS REPAIR Left     Family History  Problem Relation Age of Onset  . Stomach cancer Mother   . Rectal cancer Father   . Colon polyps Sister   . Colon polyps Brother     Social History   Socioeconomic History  . Marital status: Married    Spouse name: sharon  . Number of children: 2  . Years of education: college  . Highest education level: Not on file   Occupational History    Employer: [employer]  Social Needs  . Financial resource strain: Not on file  . Food insecurity    Worry: Not on file    Inability: Not on file  . Transportation needs    Medical: Not on file    Non-medical: Not on file  Tobacco Use  . Smoking status: Never Smoker  . Smokeless tobacco: Never Used  Substance and Sexual Activity  . Alcohol use: Yes    Alcohol/week: 0.0 standard drinks    Comment: occasional  . Drug use: No  . Sexual activity: Yes  Lifestyle  . Physical activity    Days per week: Not on file    Minutes per session: Not on file  . Stress: Not on file  Relationships  . Social Herbalist on phone: Not on file    Gets together: Not on file    Attends religious service: Not on file    Active member of club or organization: Not on file    Attends meetings of clubs or organizations: Not on file    Relationship status: Not on file  . Intimate partner violence    Fear of current or ex partner: Not on file    Emotionally abused: Not on file    Physically abused: Not on  file    Forced sexual activity: Not on file  Other Topics Concern  . Not on file  Social History Narrative   Patient is right handed, resides with wife   1cup of coffee a day     Outpatient Medications Prior to Visit  Medication Sig Dispense Refill  . clopidogrel (PLAVIX) 75 MG tablet TAKE 1 TABLET BY MOUTH EVERY DAY 90 tablet 0  . clotrimazole (LOTRIMIN) 1 % cream Apply 1 application topically 2 (two) times daily. To affected skin - continue for one week after resolution of rash 45 g 0  . Cyanocobalamin (VITAMIN B-12 PO) Take 1 tablet by mouth daily.    Marland Kitchen glimepiride (AMARYL) 4 MG tablet TAKE 1 TABLET EVERY DAY WITH FOOD. 90 tablet 0  . meclizine (ANTIVERT) 25 MG tablet Take 1-2 tablets (25-50 mg total) by mouth 3 (three) times daily as needed for dizziness. 30 tablet 1  . meloxicam (MOBIC) 15 MG tablet Take 1 tablet (15 mg total) by mouth daily as needed  for pain. 30 tablet 3  . metFORMIN (GLUCOPHAGE) 1000 MG tablet Take 1 tablet (1,000 mg total) by mouth 2 (two) times daily with a meal. 180 tablet 0  . ondansetron (ZOFRAN ODT) 4 MG disintegrating tablet Take 1 tablet (4 mg total) by mouth every 8 (eight) hours as needed for nausea or vomiting. (Patient not taking: Reported on 01/17/2019) 20 tablet 1  . pravastatin (PRAVACHOL) 40 MG tablet Take 1 tablet (40 mg total) by mouth daily. 90 tablet 0  . Suvorexant (BELSOMRA) 15 MG TABS Take 15 mg by mouth at bedtime as needed. 30 tablet 0  . Vitamin D, Ergocalciferol, (DRISDOL) 1.25 MG (50000 UT) CAPS capsule TAKE 1 CAPSULE (50,000 UNITS TOTAL) BY MOUTH EVERY 7 (SEVEN) DAYS. 24 capsule 0   No facility-administered medications prior to visit.     No Known Allergies  ROS Review of Systems Review of Systems  Constitutional: Negative for activity change, appetite change, chills and fever.  HENT: Negative for congestion, nosebleeds, trouble swallowing and voice change.   Respiratory: Negative for cough, shortness of breath and wheezing.   Gastrointestinal: Negative for diarrhea, nausea and vomiting.  Genitourinary: Negative for difficulty urinating, dysuria, flank pain and hematuria.  Musculoskeletal: Negative for back pain, joint swelling and neck pain.  Neurological: Negative for dizziness, speech difficulty, light-headedness and numbness.  See HPI. All other review of systems negative.     Objective:    Physical Exam  BP 137/86   Pulse 94   Temp 98.1 F (36.7 C)   Ht 5' 9.29" (1.76 m)   Wt 253 lb (114.8 kg)   SpO2 97%   BMI 37.05 kg/m  Wt Readings from Last 3 Encounters:  07/06/19 253 lb (114.8 kg)  08/14/18 248 lb 6.4 oz (112.7 kg)  07/30/18 245 lb (111.1 kg)   Physical Exam  Constitutional: Oriented to person, place, and time. Appears well-developed and well-nourished.  HENT:  Head: Normocephalic and atraumatic.  Eyes: Conjunctivae and EOM are normal.  Cardiovascular: Normal  rate, regular rhythm, normal heart sounds and intact distal pulses.  No murmur heard. Pulmonary/Chest: Effort normal and breath sounds normal. No stridor. No respiratory distress. Has no wheezes.  Neurological: Is alert and oriented to person, place, and time.  Skin: Skin is warm. Capillary refill takes less than 2 seconds.  Psychiatric: Has a normal mood and affect. Behavior is normal. Judgment and thought content normal.     Health Maintenance Due  Topic Date Due  .  OPHTHALMOLOGY EXAM  02/28/1962  . URINE MICROALBUMIN  08/15/2019    There are no preventive care reminders to display for this patient.  Lab Results  Component Value Date   TSH 2.740 08/15/2017   Lab Results  Component Value Date   WBC 6.1 08/14/2018   HGB 13.4 08/14/2018   HCT 39.5 08/14/2018   MCV 93 08/14/2018   PLT 381 08/14/2018   Lab Results  Component Value Date   NA 141 01/18/2019   K 4.4 01/18/2019   CO2 21 01/18/2019   GLUCOSE 191 (H) 01/18/2019   BUN 9 01/18/2019   CREATININE 1.05 01/18/2019   BILITOT 0.3 01/18/2019   ALKPHOS 60 01/18/2019   AST 32 01/18/2019   ALT 39 01/18/2019   PROT 7.1 01/18/2019   ALBUMIN 4.7 01/18/2019   CALCIUM 9.6 01/18/2019   ANIONGAP 10 09/16/2015   Lab Results  Component Value Date   CHOL 174 01/18/2019   Lab Results  Component Value Date   HDL 45 01/18/2019   Lab Results  Component Value Date   LDLCALC 102 (H) 01/18/2019   Lab Results  Component Value Date   TRIG 136 01/18/2019   Lab Results  Component Value Date   CHOLHDL 3.9 01/18/2019   Lab Results  Component Value Date   HGBA1C 6.8 (H) 01/18/2019      Assessment & Plan:   Problem List Items Addressed This Visit      Endocrine   Diabetes mellitus without complication ([place]) - Primary   Relevant Orders   Microalbumin / creatinine urine ratio   Lipid panel   CMP14+EGFR     Other   Hyperlipidemia LDL goal <70   Relevant Orders   Lipid panel   CMP14+EGFR   Obesity    Other  Visit Diagnoses    Type 2 diabetes mellitus with vascular disease ([place])       Relevant Orders   POCT glycosylated hemoglobin (Hb A1C)     1. Follow up with Dr. Leta Baptist regarding CT for monitoring of vertebral artery dissection on the right 2. Diabetes with Vascular Disease - continue pravastatin for continued CVD prevention especially given history of dissection, continue PLAVIX 3. Hyperlipidemia - continue zetia and pravastatin 4. Shift work sleep disorder-  Would not recommend Suvorexant for sleep since he has OSA.  Discussed that he should continue his CPAP. He recently retired so this is less of an issue for him     No orders of the defined types were placed in this encounter.   Follow-up: No follow-ups on file.    Forrest Moron, MD

## 2019-07-06 NOTE — Patient Instructions (Addendum)
Penni Bombard, MD Consulting Physician Neurology 07/06/2019 End  07/06/19  Phone: [phone]; Fax: [phone]         If you have lab work done today you will be contacted with your lab results within the next 2 weeks.  If you have not heard from [place] then please contact us. The fastest way to get your results is to register for My Chart.   IF you received an x-ray today, you will receive an invoice from [facility]. Please contact [facility] at [phone] with questions or concerns regarding your invoice.   IF you received labwork today, you will receive an invoice from [facility]. Please contact [facility] at [phone] with questions or concerns regarding your invoice.   Our billing staff will not be able to assist you with questions regarding bills from these companies.  You will be contacted with the lab results as soon as they are available. The fastest way to get your results is to activate your My Chart account. Instructions are located on the last page of this paperwork. If you have not heard from [place] regarding the results in 2 weeks, please contact this office.

## 2019-07-07 LAB — CMP14+EGFR
ALT: 53 IU/L — ABNORMAL HIGH (ref 0–44)
AST: 39 IU/L (ref 0–40)
Albumin/Globulin Ratio: 1.9 (ref 1.2–2.2)
Albumin: 4.6 g/dL (ref 3.8–4.8)
Alkaline Phosphatase: 64 IU/L (ref 39–117)
BUN/Creatinine Ratio: 11 (ref 10–24)
BUN: 10 mg/dL (ref 8–27)
Bilirubin Total: 0.3 mg/dL (ref 0.0–1.2)
CO2: 20 mmol/L (ref 20–29)
Calcium: 9.5 mg/dL (ref 8.6–10.2)
Chloride: 106 mmol/L (ref 96–106)
Creatinine, Ser: 0.92 mg/dL (ref 0.76–1.27)
GFR calc Af Amer: 99 mL/min/{1.73_m2} (ref >59)
GFR calc non Af Amer: 86 mL/min/{1.73_m2} (ref >59)
Globulin, Total: 2.4 g/dL (ref 1.5–4.5)
Glucose: 146 mg/dL — ABNORMAL HIGH (ref 65–99)
Potassium: 4.4 mmol/L (ref 3.5–5.2)
Sodium: 140 mmol/L (ref 134–144)
Total Protein: 7 g/dL (ref 6.0–8.5)

## 2019-07-07 LAB — LIPID PANEL
Chol/HDL Ratio: 4.7 ratio (ref 0.0–5.0)
Cholesterol, Total: 186 mg/dL (ref 100–199)
HDL: 40 mg/dL (ref >39)
LDL Chol Calc (NIH): 115 mg/dL — ABNORMAL HIGH (ref 0–99)
Triglycerides: 173 mg/dL — ABNORMAL HIGH (ref 0–149)
VLDL Cholesterol Cal: 31 mg/dL (ref 5–40)

## 2019-07-07 LAB — MICROALBUMIN / CREATININE URINE RATIO
Creatinine, Urine: 216.4 mg/dL
Microalb/Creat Ratio: 2 mg/g creat (ref 0–29)
Microalbumin, Urine: 4.9 ug/mL

## 2019-07-27 ENCOUNTER — Other Ambulatory Visit: Payer: Self-pay | Admitting: Family Medicine

## 2019-07-27 DIAGNOSIS — E0865 Diabetes mellitus due to underlying condition with hyperglycemia: Secondary | ICD-10-CM

## 2019-07-28 ENCOUNTER — Other Ambulatory Visit: Payer: Self-pay | Admitting: Family Medicine

## 2019-08-09 ENCOUNTER — Telehealth: Payer: Self-pay | Admitting: Family Medicine

## 2019-08-09 NOTE — Telephone Encounter (Signed)
Patient requesting a call back from CMA to discuss latest lab results.

## 2019-09-06 NOTE — Telephone Encounter (Signed)
Lvm for pt to return office call regarding message he left.

## 2019-09-06 NOTE — Telephone Encounter (Signed)
Ok to call  patient back with lab results

## 2019-09-06 NOTE — Telephone Encounter (Signed)
Spoke with pt an advised a1c elevated and triglycerideds and ldl elevated as well.  Advised to start a heart health diet with plenty of veggies, especially green leafy greens and fruits.  Exercise and diet low or without red meats, no fried foods but baked chicken and fish and these things will lower a1c and triglycerides and LDL.  Advised pt to work hard on diet and exercise and if he can get his a1c and triglycerides within normal limits on a consistent basis providers will sometimes discontinue medications.  Pt advises he will work hard to

## 2019-10-20 ENCOUNTER — Other Ambulatory Visit: Payer: Self-pay | Admitting: Family Medicine

## 2019-10-20 DIAGNOSIS — E0865 Diabetes mellitus due to underlying condition with hyperglycemia: Secondary | ICD-10-CM

## 2019-10-20 NOTE — Telephone Encounter (Signed)
Forwarding medication refill request to the clinical pool for review. 

## 2019-12-29 ENCOUNTER — Ambulatory Visit (INDEPENDENT_AMBULATORY_CARE_PROVIDER_SITE_OTHER): Payer: Medicare Other | Admitting: Family Medicine

## 2019-12-29 VITALS — BP 137/86 | Ht 68.0 in | Wt 253.0 lb

## 2019-12-29 DIAGNOSIS — Z0000 Encounter for general adult medical examination without abnormal findings: Secondary | ICD-10-CM | POA: Diagnosis not present

## 2019-12-29 NOTE — Progress Notes (Signed)
Presents today for [employer] Visit   Date of last exam: 07/06/2019  Interpreter used for this visit?  No  I connected with  Kirk Oneal on 12/29/19 by a telephone  and verified that I am speaking with the correct person using two identifiers.   I discussed the limitations of evaluation and management by telemedicine. The patient expressed understanding and agreed to proceed.    Patient Care Team: Forrest Moron, MD as PCP - General (Internal Medicine) Penni Bombard, MD as Consulting Physician (Neurology)   Other items to address today:   Discussed Eye/Dental Discussed Immunizations Mailed AVS   Other Screening: Last screening for diabetes: 07/06/2019 Last lipid screening: 07/06/2019  ADVANCE DIRECTIVES: Discussed: yes On File: no Materials Provided: yes  Immunization status:  Immunization History  Administered Date(s) Administered  . Tdap 09/22/2012     Health Maintenance Due  Topic Date Due  . OPHTHALMOLOGY EXAM  Never done     Functional Status Survey: Is the patient deaf or have difficulty hearing?: No Does the patient have difficulty seeing, even when wearing glasses/contacts?: No Does the patient have difficulty concentrating, remembering, or making decisions?: No Does the patient have difficulty walking or climbing stairs?: No Does the patient have difficulty dressing or bathing?: No Does the patient have difficulty doing errands alone such as visiting a doctor's office or shopping?: No   6CIT Screen 12/29/2019  What Year? 0 points  What month? 0 points  What time? 0 points  Count back from 20 0 points  Months in reverse 0 points  Repeat phrase 0 points  Total Score 0        Clinical Support from 12/29/2019 in [place] at [facility]  AUDIT-C Score  0       Home Environment:   Lives in one story home No trouble climbing stairs No scattered rugs No grabs bars Adequate lighting    Patient Active  Problem List   Diagnosis Date Noted  . Chronic fatigue syndrome 01/08/2018  . 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 Vitamin D deficiency 01/08/2018  . Hyperlipidemia LDL goal <70 07/19/2017  . Obstructive sleep apnea 07/19/2017  . Obesity 07/19/2017  . Diabetes mellitus without complication ([place]) [id number]  . Atherosclerosis 04/17/2014  . 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 Arthritis   . Colon polyps   . Diabetes mellitus without complication ([place])   . 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 Diverticulosis   . HLD (hyperlipidemia)   . Sleep apnea    CPAP  . Vitamin D deficiency      Past Surgical History:  Procedure Laterality Date  . MENISCUS REPAIR Left      Family History  Problem Relation Age of Onset  . Stomach cancer Mother   . Rectal cancer Father   . Colon polyps Sister   . Colon polyps Brother      Social History   Socioeconomic History  . Marital status: Married    Spouse name: sharon  . Number of children: 2  . Years of education: college  . Highest education level: Not on file  Occupational History    Employer: [employer]  Tobacco Use  . Smoking status: Never Smoker  . Smokeless tobacco: Never Used  Substance and Sexual Activity  . Alcohol use: Yes    Alcohol/week: 0.0 standard drinks    Comment: occasional  .  Drug use: No  . Sexual activity: Yes  Other Topics Concern  . Not on file  Social History Narrative   Patient is right handed, resides with wife   1cup of coffee a day    Social Determinants of Health   Financial Resource Strain:   . Difficulty of Paying Living Expenses:   Food Insecurity:   . Worried About Charity fundraiser in the Last Year:   . Arboriculturist in the Last Year:   Transportation Needs:   . Film/video editor (Medical):   Marland Kitchen Lack of Transportation (Non-Medical):   Physical Activity:   . Days of Exercise per Week:   . Minutes  of Exercise per Session:   Stress:   . Feeling of Stress :   Social Connections:   . Frequency of Communication with Friends and Family:   . Frequency of Social Gatherings with Friends and Family:   . Attends Religious Services:   . Active Member of Clubs or Organizations:   . Attends Archivist Meetings:   Marland Kitchen Marital Status:   Intimate Partner Violence:   . Fear of Current or Ex-Partner:   . Emotionally Abused:   Marland Kitchen Physically Abused:   . Sexually Abused:      No Known Allergies   Prior to Admission medications   Medication Sig Start Date End Date Taking? Authorizing Provider  clopidogrel (PLAVIX) 75 MG tablet TAKE 1 TABLET BY MOUTH EVERY DAY 10/20/19  Yes Stallings, Zoe A, MD  glimepiride (AMARYL) 4 MG tablet TAKE 1 TABLET BY MOUTH EVERY DAY WITH FOOD 10/20/19  Yes Forrest Moron, MD  meclizine (ANTIVERT) 25 MG tablet Take 1-2 tablets (25-50 mg total) by mouth 3 (three) times daily as needed for dizziness. 08/14/18  Yes Emeterio Reeve, DO  meloxicam (MOBIC) 15 MG tablet Take 1 tablet (15 mg total) by mouth daily as needed for pain. 11/01/16  Yes [name], MD  metFORMIN (GLUCOPHAGE) 1000 MG tablet Take 1 tablet (1,000 mg total) by mouth 2 (two) times daily with a meal. 08/14/18  Yes Emeterio Reeve, DO  Vitamin D, Ergocalciferol, (DRISDOL) 1.25 MG (50000 UT) CAPS capsule TAKE 1 CAPSULE (50,000 UNITS TOTAL) BY MOUTH EVERY 7 (SEVEN) DAYS. 08/30/18  Yes Stallings, Zoe A, MD  clotrimazole (LOTRIMIN) 1 % cream Apply 1 application topically 2 (two) times daily. To affected skin - continue for one week after resolution of rash Patient not taking: Reported on 12/29/2019 08/14/18   Emeterio Reeve, DO  Cyanocobalamin (VITAMIN B-12 PO) Take 1 tablet by mouth daily.    [provider]  ondansetron (ZOFRAN ODT) 4 MG disintegrating tablet Take 1 tablet (4 mg total) by mouth every 8 (eight) hours as needed for nausea or vomiting. Patient not taking: Reported on 01/17/2019  08/14/18   Emeterio Reeve, DO  pravastatin (PRAVACHOL) 40 MG tablet Take 1 tablet (40 mg total) by mouth daily. Patient not taking: Reported on 12/29/2019 08/14/18   Emeterio Reeve, DO  Suvorexant (BELSOMRA) 15 MG TABS Take 15 mg by mouth at bedtime as needed. Patient not taking: Reported on 12/29/2019 08/14/18   Emeterio Reeve, DO  ezetimibe (ZETIA) 10 MG tablet Take 1 tablet (10 mg total) by mouth daily. Patient not taking: Reported on 09/17/2015 07/10/15 09/17/15  Wendie Agreste, MD     Depression screen [facility] 2/9 12/29/2019 07/06/2019 01/17/2019 08/14/2018 10/29/2017  Decreased Interest 0 0 1 0 0  Down, Depressed, Hopeless 0 0 1 0 0  PHQ - 2 Score 0 0 2 0 0  Altered sleeping - - 1 - -  Tired, decreased energy - - 1 - -  Change in appetite - - 0 - -  Feeling bad or failure about yourself  - - 0 - -  Trouble concentrating - - 0 - -  Moving slowly or fidgety/restless - - 0 - -  Suicidal thoughts - - 0 - -  PHQ-9 Score - - 4 - -  Difficult doing work/chores - - Somewhat difficult - -     Fall Risk  12/29/2019 07/06/2019 01/17/2019 08/14/2018 10/29/2017  Falls in the past year? 0 0 0 0 No  Number falls in past yr: 0 0 0 - -  Injury with Fall? 0 0 0 - -  Follow up - - Falls evaluation completed - -      PHYSICAL EXAM: BP 137/86 Comment: not in clinic taken from previous visit  Ht 5\' 8"  (1.727 m)   Wt 253 lb (114.8 kg)   BMI 38.47 kg/m    Wt Readings from Last 3 Encounters:  12/29/19 253 lb (114.8 kg)  07/06/19 253 lb (114.8 kg)  08/14/18 248 lb 6.4 oz (112.7 kg)      Education/Counseling provided regarding diet and exercise, prevention of chronic diseases, smoking/tobacco cessation, if applicable, and reviewed "Covered Medicare Preventive Services."

## 2019-12-29 NOTE — Patient Instructions (Signed)
Thank you for taking time to come for your Medicare Wellness Visit. I appreciate your ongoing commitment to your health goals. Please review the following plan we discussed and let me know if I can assist you in the future.  Leroy Kennedy LPN  Preventive Care 68 Years and Older, Male Preventive care refers to lifestyle choices and visits with your health care provider that can promote health and wellness. This includes:  A yearly physical exam. This is also called an annual well check.  Regular dental and eye exams.  Immunizations.  Screening for certain conditions.  Healthy lifestyle choices, such as diet and exercise. What can I expect for my preventive care visit? Physical exam Your health care provider will check:  Height and weight. These may be used to calculate body mass index (BMI), which is a measurement that tells if you are at a healthy weight.  Heart rate and blood pressure.  Your skin for abnormal spots. Counseling Your health care provider may ask you questions about:  Alcohol, tobacco, and drug use.  Emotional well-being.  Home and relationship well-being.  Sexual activity.  Eating habits.  History of falls.  Memory and ability to understand (cognition).  Work and work Statistician. What immunizations do I need?  Influenza (flu) vaccine  This is recommended every year. Tetanus, diphtheria, and pertussis (Tdap) vaccine  You may need a Td booster every 10 years. Varicella (chickenpox) vaccine  You may need this vaccine if you have not already been vaccinated. Zoster (shingles) vaccine  You may need this after age 66. Pneumococcal conjugate (PCV13) vaccine  One dose is recommended after age 84. Pneumococcal polysaccharide (PPSV23) vaccine  One dose is recommended after age 88. Measles, mumps, and rubella (MMR) vaccine  You may need at least one dose of MMR if you were born in 1957 or later. You may also need a second dose. Meningococcal  conjugate (MenACWY) vaccine  You may need this if you have certain conditions. Hepatitis A vaccine  You may need this if you have certain conditions or if you travel or work in places where you may be exposed to hepatitis A. Hepatitis B vaccine  You may need this if you have certain conditions or if you travel or work in places where you may be exposed to hepatitis B. Haemophilus influenzae type b (Hib) vaccine  You may need this if you have certain conditions. You may receive vaccines as individual doses or as more than one vaccine together in one shot (combination vaccines). Talk with your health care provider about the risks and benefits of combination vaccines. What tests do I need? Blood tests  Lipid and cholesterol levels. These may be checked every 5 years, or more frequently depending on your overall health.  Hepatitis C test.  Hepatitis B test. Screening  Lung cancer screening. You may have this screening every year starting at age 24 if you have a 30-pack-year history of smoking and currently smoke or have quit within the past 15 years.  Colorectal cancer screening. All adults should have this screening starting at age 52 and continuing until age 61. Your health care provider may recommend screening at age 57 if you are at increased risk. You will have tests every 1-10 years, depending on your results and the type of screening test.  Prostate cancer screening. Recommendations will vary depending on your family history and other risks.  Diabetes screening. This is done by checking your blood sugar (glucose) after you have not eaten for  a while (fasting). You may have this done every 1-3 years.  Abdominal aortic aneurysm (AAA) screening. You may need this if you are a current or former smoker.  Sexually transmitted disease (STD) testing. Follow these instructions at home: Eating and drinking  Eat a diet that includes fresh fruits and vegetables, whole grains, lean  protein, and low-fat dairy products. Limit your intake of foods with high amounts of sugar, saturated fats, and salt.  Take vitamin and mineral supplements as recommended by your health care provider.  Do not drink alcohol if your health care provider tells you not to drink.  If you drink alcohol: ? Limit how much you have to 0-2 drinks a day. ? Be aware of how much alcohol is in your drink. In the [place] one drink equals one 12 oz bottle of beer (355 mL), one 5 oz glass of wine (148 mL), or one 1 oz glass of hard liquor (44 mL). Lifestyle  Take daily care of your teeth and gums.  Stay active. Exercise for at least 30 minutes on 5 or more days each week.  Do not use any products that contain nicotine or tobacco, such as cigarettes, e-cigarettes, and chewing tobacco. If you need help quitting, ask your health care provider.  If you are sexually active, practice safe sex. Use a condom or other form of protection to prevent STIs (sexually transmitted infections).  Talk with your health care provider about taking a low-dose aspirin or statin. What's next?  Visit your health care provider once a year for a well check visit.  Ask your health care provider how often you should have your eyes and teeth checked.  Stay up to date on all vaccines. This information is not intended to replace advice given to you by your health care provider. Make sure you discuss any questions you have with your health care provider. Document Revised: 09/02/2018 Document Reviewed: 09/02/2018 Elsevier Patient Education  2020 Elsevier Inc.  

## 2020-01-15 ENCOUNTER — Other Ambulatory Visit: Payer: Self-pay | Admitting: Family Medicine

## 2020-01-15 DIAGNOSIS — E0865 Diabetes mellitus due to underlying condition with hyperglycemia: Secondary | ICD-10-CM

## 2020-05-08 ENCOUNTER — Telehealth: Payer: Self-pay | Admitting: Family Medicine

## 2020-05-09 LAB — HM DIABETES EYE EXAM

## 2020-05-10 ENCOUNTER — Other Ambulatory Visit: Payer: Self-pay | Admitting: Emergency Medicine

## 2020-05-10 DIAGNOSIS — E1159 Type 2 diabetes mellitus with other circulatory complications: Secondary | ICD-10-CM

## 2020-05-10 DIAGNOSIS — E0865 Diabetes mellitus due to underlying condition with hyperglycemia: Secondary | ICD-10-CM

## 2020-05-10 MED ORDER — CLOPIDOGREL BISULFATE 75 MG PO TABS: 75.0000 mg | ORAL_TABLET | Freq: Every day | ORAL | 0 refills | Status: DC

## 2020-05-10 MED ORDER — GLIMEPIRIDE 4 MG PO TABS: 4.0000 mg | ORAL_TABLET | Freq: Every day | ORAL | 0 refills | Status: DC

## 2020-05-10 NOTE — Telephone Encounter (Signed)
patient has scheduled app and need refill on his Plavix and glipizide. Med has been refilled

## 2020-05-16 ENCOUNTER — Telehealth (INDEPENDENT_AMBULATORY_CARE_PROVIDER_SITE_OTHER): Payer: Medicare Other | Admitting: Registered Nurse

## 2020-05-16 ENCOUNTER — Other Ambulatory Visit: Payer: Self-pay

## 2020-05-16 ENCOUNTER — Encounter: Payer: Self-pay | Admitting: Registered Nurse

## 2020-05-16 VITALS — Ht 69.0 in | Wt 248.0 lb

## 2020-05-16 DIAGNOSIS — R4584 Anhedonia: Secondary | ICD-10-CM

## 2020-05-16 MED ORDER — CITALOPRAM HYDROBROMIDE 20 MG PO TABS: 20.0000 mg | ORAL_TABLET | Freq: Every day | ORAL | 0 refills | Status: AC

## 2020-05-16 NOTE — Progress Notes (Signed)
Telemedicine Encounter- SOAP NOTE Established Patient  This telephone encounter was conducted with the patient's (or proxy's) verbal consent via audio telecommunications: yes  Patient was instructed to have this encounter in a suitably private space; and to only have persons present to whom they give permission to participate. In addition, patient identity was confirmed by use of name plus two identifiers (DOB and address).  I discussed the limitations, risks, security and privacy concerns of performing an evaluation and management service by telephone and the availability of in person appointments. I also discussed with the patient that there may be a patient responsible charge related to this service. The patient expressed understanding and agreed to proceed.  I spent a total of 14 minutes talking with the patient or their proxy.  Chief Complaint  Patient presents with   Fatigue    per patient on and off for a year     Subjective   Kirk Oneal is a 69 y.o. established patient. Telephone visit today for fatigue  HPI Hx of chronic fatigue - this has felt different Mixed compliance with CPAP - has been reestablishing good habits. No weight change, NVD, GI issues. Up to date on colonoscopy. Recently left job - had been working night shifts. Hasn't felt that he's needed belsomra lately.  Does report some anhedonia and feeling down. Denies HI/SI. In particular this has been worse over the past few weeks as he served in the [employer] and the fall of [place] has been difficult for him to see.  No other concerns or complaints, no other focal symptoms of note.  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 90 tablet 0   Cyanocobalamin (VITAMIN B-12 PO) Take 1 tablet by mouth daily.     glimepiride (AMARYL) 4 MG tablet Take 1 tablet (4 mg total) by mouth daily. with food 90 tablet 0   meclizine (ANTIVERT) 25 MG tablet Take 1-2 tablets (25-50 mg total) by mouth 3 (three) times daily as needed for dizziness. 30 tablet 1   metFORMIN (GLUCOPHAGE) 1000 MG tablet Take 1 tablet (1,000 mg total) by mouth 2 (two) times daily with a meal. 180 tablet 0   pravastatin (PRAVACHOL) 40 MG tablet Take 1 tablet (40 mg total) by mouth daily. 90 tablet 0   Suvorexant (BELSOMRA) 15 MG TABS Take 15 mg by mouth at bedtime as needed. 30 tablet 0   citalopram (CELEXA) 20 MG tablet Take 1 tablet (20 mg total) by mouth daily. 90 tablet 0   clotrimazole (LOTRIMIN) 1 % cream Apply 1 application topically 2 (two) times daily. To affected skin - continue for one week after resolution of rash 45  g 0   meloxicam (MOBIC) 15 MG tablet Take 1 tablet (15 mg total) by mouth daily as needed for pain. (Patient not taking: Reported on 05/16/2020) 30 tablet 3   ondansetron (ZOFRAN ODT) 4 MG disintegrating tablet Take 1 tablet (4 mg total) by mouth every 8 (eight) hours as needed for nausea or vomiting. (Patient not taking: Reported on 05/16/2020) 20 tablet 1   Vitamin D, Ergocalciferol, (DRISDOL) 1.25 MG (50000 UT) CAPS capsule TAKE 1 CAPSULE (50,000 UNITS TOTAL) BY MOUTH EVERY 7 (SEVEN) DAYS. (Patient not taking: Reported on  05/16/2020) 24 capsule 0   No current facility-administered medications for this visit.    No Known Allergies  Social History   Socioeconomic History   Marital status: Married    Spouse name: sharon   Number of children: 2   Years of education: college   Highest education level: Not on file  Occupational History    Employer: [employer]  Tobacco Use   Smoking status: Never Smoker   Smokeless tobacco: Never Used  Vaping Use   Vaping Use: Never used  Substance and Sexual Activity   Alcohol use: Yes    Alcohol/week: 0.0 standard drinks    Comment: occasional   Drug use: No   Sexual activity: Yes  Other Topics Concern   Not on file  Social History Narrative   Patient is right handed, resides with wife   1cup of coffee a day    Social Determinants of Health   Financial Resource Strain:    Difficulty of Paying Living Expenses: Not on file  Food Insecurity:    Worried About Charity fundraiser in the Last Year: Not on file   [employer] of Food in the Last Year: Not on file  Transportation Needs:    Lack of Transportation (Medical): Not on file   Lack of Transportation (Non-Medical): Not on file  Physical Activity:    Days of Exercise per Week: Not on file   Minutes of Exercise per Session: Not on file  Stress:    Feeling of Stress : Not on file  Social Connections:    Frequency of Communication with Friends and Family: Not on file   Frequency of Social Gatherings with Friends and Family: Not on file   Attends Religious Services: Not on file   Active Member of Clubs or Organizations: Not on file   Attends Archivist Meetings: Not on file   Marital Status: Not on file  Intimate Partner Violence:    Fear of Current or Ex-Partner: Not on file   Emotionally Abused: Not on file   Physically Abused: Not on file   Sexually Abused: Not on file    ROS Pertinent negatives and positives per hpi   Objective   Vitals as reported by  the patient: Today's Vitals   05/16/20 1450  Weight: 248 lb (112.5 kg)  Height: 5\' 9"  (1.753 m)    Kirk Oneal was seen today for fatigue.  Diagnoses and all orders for this visit:  Anhedonia -     citalopram (CELEXA) 20 MG tablet; Take 1 tablet (20 mg total) by mouth daily.   PLAN  Without exam or labs, best guess is that anhedonia or depression is a major contributor. Will follow up in office in 4-5 weeks for labs/exam  Start citalopram 20mg  PO qd. Discussed how to effectively take SSRI. Med check in 4-5 weeks  Patient encouraged to call clinic with any questions, comments, or concerns.  I discussed the assessment and treatment plan with the patient. The patient was provided an opportunity to ask questions and all were answered. The patient agreed with the plan and demonstrated an understanding of the instructions.   The patient was advised to call back or seek an in-person evaluation if the symptoms worsen or if the condition fails to improve as anticipated.  I provided 14 minutes of non-face-to-face time during this encounter.  Maximiano Coss, NP  Primary Care at [facility]

## 2020-05-16 NOTE — Patient Instructions (Signed)
° ° ° °  If you have lab work done today you will be contacted with your lab results within the next 2 weeks.  If you have not heard from us then please contact us. The fastest way to get your results is to register for My Chart. ° ° °IF you received an x-ray today, you will receive an invoice from [facility]. Please contact [facility] at [phone] with questions or concerns regarding your invoice.  ° °IF you received labwork today, you will receive an invoice from [facility]. Please contact [facility] at [phone] with questions or concerns regarding your invoice.  ° °Our billing staff will not be able to assist you with questions regarding bills from these companies. ° °You will be contacted with the lab results as soon as they are available. The fastest way to get your results is to activate your My Chart account. Instructions are located on the last page of this paperwork. If you have not heard from us regarding the results in 2 weeks, please contact this office. °  ° ° ° °

## 2020-06-15 ENCOUNTER — Ambulatory Visit (INDEPENDENT_AMBULATORY_CARE_PROVIDER_SITE_OTHER): Payer: Medicare Other | Admitting: Registered Nurse

## 2020-06-15 ENCOUNTER — Encounter: Payer: Self-pay | Admitting: Registered Nurse

## 2020-06-15 ENCOUNTER — Other Ambulatory Visit: Payer: Self-pay

## 2020-06-15 VITALS — BP 151/89 | HR 82 | Temp 97.6°F | Ht 68.0 in | Wt 250.2 lb

## 2020-06-15 DIAGNOSIS — Z8639 Personal history of other endocrine, nutritional and metabolic disease: Secondary | ICD-10-CM

## 2020-06-15 DIAGNOSIS — E782 Mixed hyperlipidemia: Secondary | ICD-10-CM | POA: Diagnosis not present

## 2020-06-15 DIAGNOSIS — Z125 Encounter for screening for malignant neoplasm of prostate: Secondary | ICD-10-CM | POA: Diagnosis not present

## 2020-06-15 DIAGNOSIS — R5382 Chronic fatigue, unspecified: Secondary | ICD-10-CM | POA: Diagnosis not present

## 2020-06-15 DIAGNOSIS — Z13220 Encounter for screening for lipoid disorders: Secondary | ICD-10-CM

## 2020-06-15 DIAGNOSIS — Z23 Encounter for immunization: Secondary | ICD-10-CM

## 2020-06-15 NOTE — Patient Instructions (Signed)
° ° ° °  If you have lab work done today you will be contacted with your lab results within the next 2 weeks.  If you have not heard from us then please contact us. The fastest way to get your results is to register for My Chart. ° ° °IF you received an x-ray today, you will receive an invoice from [facility]. Please contact [facility] at [phone] with questions or concerns regarding your invoice.  ° °IF you received labwork today, you will receive an invoice from [facility]. Please contact [facility] at [phone] with questions or concerns regarding your invoice.  ° °Our billing staff will not be able to assist you with questions regarding bills from these companies. ° °You will be contacted with the lab results as soon as they are available. The fastest way to get your results is to activate your My Chart account. Instructions are located on the last page of this paperwork. If you have not heard from us regarding the results in 2 weeks, please contact this office. °  ° ° ° °

## 2020-06-16 LAB — HEMOGLOBIN A1C
Est. average glucose Bld gHb Est-mCnc: 160 mg/dL
Hgb A1c MFr Bld: 7.2 % — ABNORMAL HIGH (ref 4.8–5.6)

## 2020-06-16 LAB — CBC WITH DIFFERENTIAL
Basophils Absolute: 0.1 10*3/uL (ref 0.0–0.2)
Basos: 1 %
EOS (ABSOLUTE): 0.2 10*3/uL (ref 0.0–0.4)
Eos: 3 %
Hematocrit: 39.6 % (ref 37.5–51.0)
Hemoglobin: 13.6 g/dL (ref 13.0–17.7)
Immature Grans (Abs): 0 10*3/uL (ref 0.0–0.1)
Immature Granulocytes: 0 %
Lymphocytes Absolute: 2.7 10*3/uL (ref 0.7–3.1)
Lymphs: 39 %
MCH: 32.8 pg (ref 26.6–33.0)
MCHC: 34.3 g/dL (ref 31.5–35.7)
MCV: 95 fL (ref 79–97)
Monocytes Absolute: 0.5 10*3/uL (ref 0.1–0.9)
Monocytes: 7 %
Neutrophils Absolute: 3.5 10*3/uL (ref 1.4–7.0)
Neutrophils: 50 %
RBC: 4.15 x10E6/uL (ref 4.14–5.80)
RDW: 13.3 % (ref 11.6–15.4)
WBC: 7 10*3/uL (ref 3.4–10.8)

## 2020-06-16 LAB — CMP14+EGFR
ALT: 60 IU/L — ABNORMAL HIGH (ref 0–44)
AST: 46 IU/L — ABNORMAL HIGH (ref 0–40)
Albumin/Globulin Ratio: 2 (ref 1.2–2.2)
Albumin: 4.7 g/dL (ref 3.8–4.8)
Alkaline Phosphatase: 62 IU/L (ref 44–121)
BUN/Creatinine Ratio: 9 — ABNORMAL LOW (ref 10–24)
BUN: 9 mg/dL (ref 8–27)
Bilirubin Total: 0.4 mg/dL (ref 0.0–1.2)
CO2: 21 mmol/L (ref 20–29)
Calcium: 9.6 mg/dL (ref 8.6–10.2)
Chloride: 105 mmol/L (ref 96–106)
Creatinine, Ser: 1.01 mg/dL (ref 0.76–1.27)
GFR calc Af Amer: 88 mL/min/{1.73_m2} (ref >59)
GFR calc non Af Amer: 76 mL/min/{1.73_m2} (ref >59)
Globulin, Total: 2.3 g/dL (ref 1.5–4.5)
Glucose: 87 mg/dL (ref 65–99)
Potassium: 4.2 mmol/L (ref 3.5–5.2)
Sodium: 141 mmol/L (ref 134–144)
Total Protein: 7 g/dL (ref 6.0–8.5)

## 2020-06-16 LAB — LIPID PANEL
Chol/HDL Ratio: 4.2 ratio (ref 0.0–5.0)
Cholesterol, Total: 183 mg/dL (ref 100–199)
HDL: 44 mg/dL (ref >39)
LDL Chol Calc (NIH): 118 mg/dL — ABNORMAL HIGH (ref 0–99)
Triglycerides: 119 mg/dL (ref 0–149)
VLDL Cholesterol Cal: 21 mg/dL (ref 5–40)

## 2020-06-16 LAB — VITAMIN B12: Vitamin B-12: 283 pg/mL (ref 232–1245)

## 2020-06-16 LAB — VITAMIN D 25 HYDROXY (VIT D DEFICIENCY, FRACTURES): Vit D, 25-Hydroxy: 35.7 ng/mL (ref 30.0–100.0)

## 2020-06-16 LAB — PSA: Prostate Specific Ag, Serum: 1 ng/mL (ref 0.0–4.0)

## 2020-06-16 LAB — TSH: TSH: 3.23 u[IU]/mL (ref 0.450–4.500)

## 2020-08-09 ENCOUNTER — Other Ambulatory Visit: Payer: Self-pay | Admitting: Registered Nurse

## 2020-08-09 DIAGNOSIS — E1159 Type 2 diabetes mellitus with other circulatory complications: Secondary | ICD-10-CM

## 2020-08-09 DIAGNOSIS — E0865 Diabetes mellitus due to underlying condition with hyperglycemia: Secondary | ICD-10-CM

## 2020-08-09 NOTE — Telephone Encounter (Signed)
Requested medication (s) are due for refill today - yes  Requested medication (s) are on the active medication list -yes  Future visit scheduled -no  Last refill: 05/11/20  Notes to clinic: patient fails lab protocol- sent for review of request  Requested Prescriptions  Pending Prescriptions Disp Refills   clopidogrel (PLAVIX) 75 MG tablet [Pharmacy Med Name: CLOPIDOGREL 75 MG TABLET] 90 tablet 0    Sig: TAKE 1 TABLET BY [place]      Hematology: Antiplatelets - clopidogrel Failed - 08/09/2020 11:51 AM      Failed - Evaluate AST, ALT within 2 months of therapy initiation.      Failed - ALT in normal range and within 360 days    ALT  Date Value Ref Range Status  06/15/2020 60 (H) 0 - 44 IU/L Final          Failed - AST in normal range and within 360 days    AST  Date Value Ref Range Status  06/15/2020 46 (H) 0 - 40 IU/L Final          Failed - PLT in normal range and within 180 days    Platelets  Date Value Ref Range Status  08/14/2018 381 150 - 450 x10E3/uL Final   Platelet Count, POC  Date Value Ref Range Status  08/15/2017 367 142 - 424 K/uL Final          Passed - HCT in normal range and within 180 days    Hematocrit  Date Value Ref Range Status  06/15/2020 39.6 37.5 - 51.0 % Final          Passed - HGB in normal range and within 180 days    Hemoglobin  Date Value Ref Range Status  06/15/2020 13.6 13.0 - 17.7 g/dL Final          Passed - Valid encounter within last 6 months    Recent Outpatient Visits           1 month ago Chronic fatigue, unspecified   [facility], Delfino Lovett, NP   2 months ago Anhedonia   [facility], Delfino Lovett, NP   7 months ago Medicare annual wellness visit, subsequent   [facility], Ranell Patrick, MD   1 year ago Diabetes mellitus without complication [facility])   Primary Care at [facility], Zoe A, MD   1 year ago Diabetes mellitus due to underlying condition with  hyperglycemia, without long-term current use of insulin ([place])   Primary Care at [facility], Arlie Solomons, MD               Signed Prescriptions Disp Refills   glimepiride (AMARYL) 4 MG tablet 90 tablet 0    Sig: TAKE 1 TABLET (4 MG TOTAL) BY MOUTH DAILY. WITH FOOD      Endocrinology:  Diabetes - Sulfonylureas Passed - 08/09/2020 11:51 AM      Passed - HBA1C is between 0 and 7.9 and within 180 days    Hgb A1c MFr Bld  Date Value Ref Range Status  06/15/2020 7.2 (H) 4.8 - 5.6 % Final    Comment:             Prediabetes: 5.7 - 6.4          Diabetes: >6.4          Glycemic control for adults with diabetes: <7.0           Passed -  Valid encounter within last 6 months    Recent Outpatient Visits           1 month ago Chronic fatigue, unspecified   [facility], Delfino Lovett, NP   2 months ago Anhedonia   [facility], Delfino Lovett, NP   7 months ago Medicare annual wellness visit, subsequent   [facility], Ranell Patrick, MD   1 year ago Diabetes mellitus without complication [facility])   Primary Care at [facility], Zoe A, MD   1 year ago Diabetes mellitus due to underlying condition with hyperglycemia, without long-term current use of insulin ([place])   Primary Care at [place], MD                  Requested Prescriptions  Pending Prescriptions Disp Refills   clopidogrel (PLAVIX) 75 MG tablet [Pharmacy Med Name: CLOPIDOGREL 75 MG TABLET] 90 tablet 0    Sig: TAKE 1 TABLET BY [place] DAY      Hematology: Antiplatelets - clopidogrel Failed - 08/09/2020 11:51 AM      Failed - Evaluate AST, ALT within 2 months of therapy initiation.      Failed - ALT in normal range and within 360 days    ALT  Date Value Ref Range Status  06/15/2020 60 (H) 0 - 44 IU/L Final          Failed - AST in normal range and within 360 days    AST  Date Value Ref Range Status  06/15/2020 46 (H) 0 - 40 IU/L Final          Failed  - PLT in normal range and within 180 days    Platelets  Date Value Ref Range Status  08/14/2018 381 150 - 450 x10E3/uL Final   Platelet Count, POC  Date Value Ref Range Status  08/15/2017 367 142 - 424 K/uL Final          Passed - HCT in normal range and within 180 days    Hematocrit  Date Value Ref Range Status  06/15/2020 39.6 37.5 - 51.0 % Final          Passed - HGB in normal range and within 180 days    Hemoglobin  Date Value Ref Range Status  06/15/2020 13.6 13.0 - 17.7 g/dL Final          Passed - Valid encounter within last 6 months    Recent Outpatient Visits           1 month ago Chronic fatigue, unspecified   [facility], Delfino Lovett, NP   2 months ago Anhedonia   [facility], Delfino Lovett, NP   7 months ago Medicare annual wellness visit, subsequent   [facility], Ranell Patrick, MD   1 year ago Diabetes mellitus without complication [facility])   Primary Care at [facility], Zoe A, MD   1 year ago Diabetes mellitus due to underlying condition with hyperglycemia, without long-term current use of insulin ([place])   Primary Care at [facility], Arlie Solomons, MD               Signed Prescriptions Disp Refills   glimepiride (AMARYL) 4 MG tablet 90 tablet 0    Sig: TAKE 1 TABLET (4 MG TOTAL) BY MOUTH DAILY. WITH FOOD      Endocrinology:  Diabetes - Sulfonylureas Passed - 08/09/2020 11:51 AM  Passed - HBA1C is between 0 and 7.9 and within 180 days    Hgb A1c MFr Bld  Date Value Ref Range Status  06/15/2020 7.2 (H) 4.8 - 5.6 % Final    Comment:             Prediabetes: 5.7 - 6.4          Diabetes: >6.4          Glycemic control for adults with diabetes: <7.0           Passed - Valid encounter within last 6 months    Recent Outpatient Visits           1 month ago Chronic fatigue, unspecified   [facility], Delfino Lovett, NP   2 months ago Anhedonia   [facility], Delfino Lovett, NP    7 months ago Medicare annual wellness visit, subsequent   [facility], Ranell Patrick, MD   1 year ago Diabetes mellitus without complication [facility])   Primary Care at [facility], Zoe A, MD   1 year ago Diabetes mellitus due to underlying condition with hyperglycemia, without long-term current use of insulin [facility])   Primary Care at [facility], Arlie Solomons, MD

## 2020-08-19 ENCOUNTER — Encounter: Payer: Self-pay | Admitting: Registered Nurse

## 2020-08-19 NOTE — Progress Notes (Signed)
Established Patient Office Visit  Subjective:  Patient ID: Kirk Oneal, male    DOB: 1951-03-08  Age: 69 y.o. MRN: [id number]  CC:  Chief Complaint  Patient presents with  . Follow-up    HPI [name] presents for follow up for fatigue Feeling the same, no changes. bp elevated today, no cv symptoms Has been doing some lifestyle modifications to improve on this  Past Medical History:  Diagnosis Date  . Arthritis   . Colon polyps   . Diabetes mellitus without complication ([place])   . 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 Diverticulosis   . HLD (hyperlipidemia)   . Sleep apnea    CPAP  . Vitamin D deficiency     Past Surgical History:  Procedure Laterality Date  . MENISCUS REPAIR Left     Family History  Problem Relation Age of Onset  . Stomach cancer Mother   . Rectal cancer Father   . Colon polyps Sister   . Colon polyps Brother     Social History   Socioeconomic History  . Marital status: Married    Spouse name: sharon  . Number of children: 2  . Years of education: college  . Highest education level: Not on file  Occupational History    Employer: [employer]  Tobacco Use  . Smoking status: Never Smoker  . Smokeless tobacco: Never Used  Vaping Use  . Vaping Use: Never used  Substance and Sexual Activity  . Alcohol use: Yes    Alcohol/week: 0.0 standard drinks    Comment: occasional  . Drug use: No  . Sexual activity: Yes  Other Topics Concern  . Not on file  Social History Narrative   Patient is right handed, resides with wife   1cup of coffee a day    Social Determinants of Health   Financial Resource Strain:   . Difficulty of Paying Living Expenses: Not on file  Food Insecurity:   . Worried About Charity fundraiser in the Last Year: Not on file  . Ran Out of Food in the Last Year: Not on file  Transportation Needs:   . Lack of Transportation (Medical): Not on file  . Lack of Transportation (Non-Medical): Not  on file  Physical Activity:   . Days of Exercise per Week: Not on file  . Minutes of Exercise per Session: Not on file  Stress:   . Feeling of Stress : Not on file  Social Connections:   . Frequency of Communication with Friends and Family: Not on file  . Frequency of Social Gatherings with Friends and Family: Not on file  . Attends Religious Services: Not on file  . Active Member of Clubs or Organizations: Not on file  . Attends Archivist Meetings: Not on file  . Marital Status: Not on file  Intimate Partner Violence:   . Fear of Current or Ex-Partner: Not on file  . Emotionally Abused: Not on file  . Physically Abused: Not on file  . Sexually Abused: Not on file    Outpatient Medications Prior to Visit  Medication Sig Dispense Refill  . citalopram (CELEXA) 20 MG tablet Take 1 tablet (20 mg total) by mouth daily. 90 tablet 0  . clotrimazole (LOTRIMIN) 1 % cream Apply 1 application topically 2 (two) times daily. To affected skin - continue for one week after resolution of rash 45 g 0  . Cyanocobalamin (VITAMIN B-12 PO) Take 1 tablet by mouth  daily.    . meclizine (ANTIVERT) 25 MG tablet Take 1-2 tablets (25-50 mg total) by mouth 3 (three) times daily as needed for dizziness. 30 tablet 1  . meloxicam (MOBIC) 15 MG tablet Take 1 tablet (15 mg total) by mouth daily as needed for pain. 30 tablet 3  . metFORMIN (GLUCOPHAGE) 1000 MG tablet Take 1 tablet (1,000 mg total) by mouth 2 (two) times daily with a meal. 180 tablet 0  . ondansetron (ZOFRAN ODT) 4 MG disintegrating tablet Take 1 tablet (4 mg total) by mouth every 8 (eight) hours as needed for nausea or vomiting. 20 tablet 1  . pravastatin (PRAVACHOL) 40 MG tablet Take 1 tablet (40 mg total) by mouth daily. 90 tablet 0  . Suvorexant (BELSOMRA) 15 MG TABS Take 15 mg by mouth at bedtime as needed. 30 tablet 0  . Vitamin D, Ergocalciferol, (DRISDOL) 1.25 MG (50000 UT) CAPS capsule TAKE 1 CAPSULE (50,000 UNITS TOTAL) BY MOUTH  EVERY 7 (SEVEN) DAYS. 24 capsule 0  . clopidogrel (PLAVIX) 75 MG tablet Take 1 tablet (75 mg total) by mouth daily. 90 tablet 0  . glimepiride (AMARYL) 4 MG tablet Take 1 tablet (4 mg total) by mouth daily. with food 90 tablet 0   No facility-administered medications prior to visit.    No Known Allergies  ROS Review of Systems  Constitutional: Negative.   HENT: Negative.   Eyes: Negative.   Respiratory: Negative.   Cardiovascular: Negative.   Gastrointestinal: Negative.   Genitourinary: Negative.   Musculoskeletal: Negative.   Skin: Negative.   Neurological: Negative.   Psychiatric/Behavioral: Negative.       Objective:    Physical Exam Constitutional:      General: He is not in acute distress.    Appearance: Normal appearance. He is normal weight. He is not ill-appearing, toxic-appearing or diaphoretic.  Cardiovascular:     Rate and Rhythm: Normal rate and regular rhythm.     Heart sounds: Normal heart sounds. No murmur heard.  No friction rub. No gallop.   Pulmonary:     Effort: Pulmonary effort is normal. No respiratory distress.     Breath sounds: Normal breath sounds. No stridor. No wheezing, rhonchi or rales.  Chest:     Chest wall: No tenderness.  Neurological:     General: No focal deficit present.     Mental Status: He is alert and oriented to person, place, and time. Mental status is at baseline.  Psychiatric:        Mood and Affect: Mood normal.        Behavior: Behavior normal.        Thought Content: Thought content normal.        Judgment: Judgment normal.     BP (!) 151/89 (BP Location: Right Arm, Patient Position: Sitting, Cuff Size: Large)   Pulse 82   Temp 97.6 F (36.4 C) (Temporal)   Ht _0  (1.727 m)   Wt 250 lb 3.2 oz (113.5 kg)   SpO2 94%   BMI 38.04 kg/m  Wt Readings from Last 3 Encounters:  06/15/20 250 lb 3.2 oz (113.5 kg)  05/16/20 248 lb (112.5 kg)  12/29/19 253 lb (114.8 kg)     Health Maintenance Due  Topic Date Due    . COVID-19 Vaccine (1) Never done  . PNA vac Low Risk Adult (1 of 2 - PCV13) Never done  . FOOT EXAM  07/05/2020  . URINE MICROALBUMIN  07/05/2020    There are  no preventive care reminders to display for this patient.  Lab Results  Component Value Date   TSH 3.230 06/15/2020   Lab Results  Component Value Date   WBC 7.0 06/15/2020   HGB 13.6 06/15/2020   HCT 39.6 06/15/2020   MCV 95 06/15/2020   PLT 381 08/14/2018   Lab Results  Component Value Date   NA 141 06/15/2020   K 4.2 06/15/2020   CO2 21 06/15/2020   GLUCOSE 87 06/15/2020   BUN 9 06/15/2020   CREATININE 1.01 06/15/2020   BILITOT 0.4 06/15/2020   ALKPHOS 62 06/15/2020   AST 46 (H) 06/15/2020   ALT 60 (H) 06/15/2020   PROT 7.0 06/15/2020   ALBUMIN 4.7 06/15/2020   CALCIUM 9.6 06/15/2020   ANIONGAP 10 09/16/2015   Lab Results  Component Value Date   CHOL 183 06/15/2020   Lab Results  Component Value Date   HDL 44 06/15/2020   Lab Results  Component Value Date   LDLCALC 118 (H) 06/15/2020   Lab Results  Component Value Date   TRIG 119 06/15/2020   Lab Results  Component Value Date   CHOLHDL 4.2 06/15/2020   Lab Results  Component Value Date   HGBA1C 7.2 (H) 06/15/2020      Assessment & Plan:   Problem List Items Addressed This Visit    None    Visit Diagnoses    Chronic fatigue, unspecified    -  Primary   Relevant Orders   CMP14+EGFR (Completed)   PSA (Completed)   TSH (Completed)   CBC With Differential (Completed)   Vitamin D, 25-hydroxy (Completed)   Vitamin B12 (Completed)   Flu vaccine need       Screening PSA (prostate specific antigen)       Relevant Orders   PSA (Completed)   Lipid screening       Mixed hyperlipidemia       Relevant Orders   Lipid Panel   History of elevated glucose       Relevant Orders   Hemoglobin A1c      No orders of the defined types were placed in this encounter.   Follow-up: No follow-ups on file.   PLAN  Unclear  etiology.  Labs collected. Will follow up with the patient as warranted.  Continue regular exercise and healthy diet  Patient encouraged to call clinic with any questions, comments, or concerns.  Maximiano Coss, NP

## 2020-11-02 ENCOUNTER — Other Ambulatory Visit: Payer: Self-pay | Admitting: Registered Nurse

## 2020-11-02 DIAGNOSIS — E1159 Type 2 diabetes mellitus with other circulatory complications: Secondary | ICD-10-CM

## 2020-11-02 DIAGNOSIS — E0865 Diabetes mellitus due to underlying condition with hyperglycemia: Secondary | ICD-10-CM

## 2021-04-29 DIAGNOSIS — B360 Pityriasis versicolor: Secondary | ICD-10-CM | POA: Diagnosis not present

## 2021-05-02 ENCOUNTER — Other Ambulatory Visit: Payer: Self-pay

## 2021-05-02 ENCOUNTER — Ambulatory Visit (INDEPENDENT_AMBULATORY_CARE_PROVIDER_SITE_OTHER): Payer: Medicare Other | Admitting: Sports Medicine

## 2021-05-02 DIAGNOSIS — M2041 Other hammer toe(s) (acquired), right foot: Secondary | ICD-10-CM | POA: Diagnosis not present

## 2021-05-02 DIAGNOSIS — E119 Type 2 diabetes mellitus without complications: Secondary | ICD-10-CM | POA: Diagnosis not present

## 2021-05-02 DIAGNOSIS — M21619 Bunion of unspecified foot: Secondary | ICD-10-CM | POA: Diagnosis not present

## 2021-05-02 DIAGNOSIS — M2042 Other hammer toe(s) (acquired), left foot: Secondary | ICD-10-CM | POA: Diagnosis not present

## 2021-05-02 DIAGNOSIS — M2142 Flat foot [pes planus] (acquired), left foot: Secondary | ICD-10-CM | POA: Diagnosis not present

## 2021-05-02 DIAGNOSIS — L603 Nail dystrophy: Secondary | ICD-10-CM | POA: Diagnosis not present

## 2021-05-02 DIAGNOSIS — M2141 Flat foot [pes planus] (acquired), right foot: Secondary | ICD-10-CM

## 2021-05-02 DIAGNOSIS — B351 Tinea unguium: Secondary | ICD-10-CM | POA: Diagnosis not present

## 2021-05-02 NOTE — Progress Notes (Signed)
u

## 2021-05-02 NOTE — Progress Notes (Signed)
Subjective: Kirk Oneal is a 70 y.o. male patient seen today in office with complaint of mildly painful thickened and discolored nails. Patient is desiring treatment for nail changes and reports that he was told he has onychomycosis. Patient is diabetic doesn't check sugar every day and last A1c 7. Patient has no other pedal complaints at this time.   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 90 tablet 0   clopidogrel (PLAVIX) 75 MG tablet TAKE 1 TABLET BY MOUTH EVERY DAY 90 tablet 0   clotrimazole (LOTRIMIN) 1 % cream Apply 1 application topically 2 (two) times daily. To affected skin - continue for one week after resolution of rash 45 g 0   Cyanocobalamin (VITAMIN B-12 PO) Take 1 tablet by mouth daily.     glimepiride (AMARYL) 4 MG tablet TAKE 1 TABLET (4 MG TOTAL) BY MOUTH DAILY. WITH FOOD 90 tablet 0   meclizine (ANTIVERT) 25 MG tablet Take 1-2 tablets (25-50 mg total) by mouth 3 (three) times daily as needed for dizziness. 30 tablet 1   meloxicam (MOBIC) 15 MG tablet Take 1 tablet (15 mg total) by mouth daily as needed for pain. 30 tablet 3   metFORMIN (GLUCOPHAGE) 1000 MG tablet Take 1 tablet (1,000 mg total) by mouth 2 (two) times daily with a meal. 180 tablet 0   ondansetron (ZOFRAN ODT) 4 MG disintegrating tablet Take 1 tablet (4 mg total) by mouth every 8 (eight) hours as needed for nausea or vomiting. 20 tablet 1   pravastatin (PRAVACHOL) 40 MG tablet Take 1  tablet (40 mg total) by mouth daily. 90 tablet 0   Suvorexant (BELSOMRA) 15 MG TABS Take 15 mg by mouth at bedtime as needed. 30 tablet 0   Vitamin D, Ergocalciferol, (DRISDOL) 1.25 MG (50000 UT) CAPS capsule TAKE 1 CAPSULE (50,000 UNITS TOTAL) BY MOUTH EVERY 7 (SEVEN) DAYS. 24 capsule 0   No current facility-administered medications on file prior to visit.    No Known Allergies  Objective: Physical Exam  General: Well developed, nourished, no acute distress, awake, alert and oriented x 3  Vascular: Dorsalis pedis artery 2/4 bilateral, Posterior tibial artery 1/4 bilateral, skin temperature warm to warm proximal to distal bilateral lower extremities, no varicosities, pedal hair present bilateral.  Neurological: Gross sensation present via light touch bilateral.   Dermatological: Skin is warm, dry, and supple bilateral, Nails 1-10 are tender, short thick, and discolored with mild subungal debris, no webspace macerations present bilateral, no open lesions present bilateral, no callus/corns/hyperkeratotic tissue present bilateral. No signs of infection bilateral.  Musculoskeletal: + Bunoin, hammertoe, pes planus boney deformities noted bilateral. Muscular strength within normal limits without painon range of motion. No pain with calf compression bilateral.  Assessment and Plan:  Problem List Items Addressed This Visit       Endocrine   Diabetes mellitus without complication ([place])   Other Visit Diagnoses     Onychodystrophy    -  Primary   Relevant  Orders   Culture, fungus without smear   Pes planus of both feet       Bunion       Hammer toes of both feet           -Examined patient -Discussed treatment options for painful dystrophic nails likely mycosis  -Fungal culture was obtained by removing a portion of the hard nail itself from each of the involved toenails using a sterile nail nipper and sent to [facility] lab. Patient tolerated the biopsy procedure well without discomfort or  need for anesthesia.  -Patient to return in 4 weeks for follow up evaluation and discussion of fungal culture results or sooner if symptoms worsen.  [name], DPM

## 2021-06-06 ENCOUNTER — Other Ambulatory Visit: Payer: Self-pay

## 2021-06-06 ENCOUNTER — Encounter: Payer: Self-pay | Admitting: Sports Medicine

## 2021-06-06 ENCOUNTER — Ambulatory Visit (INDEPENDENT_AMBULATORY_CARE_PROVIDER_SITE_OTHER): Payer: Medicare Other | Admitting: Sports Medicine

## 2021-06-06 DIAGNOSIS — E119 Type 2 diabetes mellitus without complications: Secondary | ICD-10-CM | POA: Diagnosis not present

## 2021-06-06 DIAGNOSIS — M2141 Flat foot [pes planus] (acquired), right foot: Secondary | ICD-10-CM | POA: Diagnosis not present

## 2021-06-06 DIAGNOSIS — M2042 Other hammer toe(s) (acquired), left foot: Secondary | ICD-10-CM

## 2021-06-06 DIAGNOSIS — M792 Neuralgia and neuritis, unspecified: Secondary | ICD-10-CM | POA: Diagnosis not present

## 2021-06-06 DIAGNOSIS — M21619 Bunion of unspecified foot: Secondary | ICD-10-CM

## 2021-06-06 DIAGNOSIS — L603 Nail dystrophy: Secondary | ICD-10-CM

## 2021-06-06 DIAGNOSIS — M2041 Other hammer toe(s) (acquired), right foot: Secondary | ICD-10-CM

## 2021-06-06 DIAGNOSIS — M2142 Flat foot [pes planus] (acquired), left foot: Secondary | ICD-10-CM | POA: Diagnosis not present

## 2021-06-06 NOTE — Progress Notes (Signed)
Subjective: Kirk Oneal is a 70 y.o. male patient seen today in office for fungal culture results. Patient has no other pedal complaints at this time.   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 90 tablet 0   clopidogrel (PLAVIX) 75 MG tablet TAKE 1 TABLET BY MOUTH EVERY DAY 90 tablet 0   clotrimazole (LOTRIMIN) 1 % cream Apply 1 application topically 2 (two) times daily. To affected skin - continue for one week after resolution of rash 45 g 0   Cyanocobalamin (VITAMIN B-12 PO) Take 1 tablet by mouth daily.     glimepiride (AMARYL) 4 MG tablet TAKE 1 TABLET (4 MG TOTAL) BY MOUTH DAILY. WITH FOOD 90 tablet 0   meclizine (ANTIVERT) 25 MG tablet Take 1-2 tablets (25-50 mg total) by mouth 3 (three) times daily as needed for dizziness. 30 tablet 1   meloxicam (MOBIC) 15 MG tablet Take 1 tablet (15 mg total) by mouth daily as needed for pain. 30 tablet 3   metFORMIN (GLUCOPHAGE) 1000 MG tablet Take 1 tablet (1,000 mg total) by mouth 2 (two) times daily with a meal. 180 tablet 0   ondansetron (ZOFRAN ODT) 4 MG disintegrating tablet Take 1 tablet (4 mg total) by mouth every 8 (eight) hours as needed for nausea or vomiting. 20 tablet 1   pravastatin (PRAVACHOL) 40 MG tablet Take 1 tablet (40 mg total) by mouth daily. 90 tablet 0   Suvorexant (BELSOMRA) 15 MG TABS Take 15 mg by mouth at bedtime as needed. 30 tablet 0   Vitamin D, Ergocalciferol, (DRISDOL) 1.25 MG (50000 UT)  CAPS capsule TAKE 1 CAPSULE (50,000 UNITS TOTAL) BY MOUTH EVERY 7 (SEVEN) DAYS. 24 capsule 0   No current facility-administered medications on file prior to visit.    No Known Allergies  Objective: Physical Exam  General: Well developed, nourished, no acute distress, awake, alert and oriented x 3  Vascular: Dorsalis pedis artery 2/4 bilateral, Posterior tibial artery 1/4 bilateral, skin temperature warm to warm proximal to distal bilateral lower extremities, no varicosities, pedal hair present bilateral.   Neurological: Gross sensation present via light touch bilateral.  Subjective numbness to both feet occasionally.   Dermatological: Skin is warm, dry, and supple bilateral, Nails 1-10 are tender, short thick, and discolored with mild subungal debris, no webspace macerations present bilateral, no open lesions present bilateral, no callus/corns/hyperkeratotic tissue present bilateral. No signs of infection bilateral.   Musculoskeletal: + Bunion, hammertoe, pes planus boney deformities noted bilateral. Muscular strength within normal limits without painon range of motion. No pain with calf compression bilateral.  Fungal culture positive for fungus and microtrauma  Assessment and Plan:  Problem List Items Addressed This Visit       Endocrine   Diabetes mellitus without complication ([place])   Other Visit Diagnoses     Onychodystrophy    -  Primary   Pes planus of both feet       Bunion  Hammer toes of both feet       Neuritis           -Examined patient -Discussed treatment options for painful mycotic nails -Patient opt to think about treatment options at this time of 1.  Lamisil by mouth 2.  Laser 3.  Topical nail lacquer -Patient desires to call office once he has made a decision on what treatment option he would like to try for his nail fungus -Encourage patient to wear shoes that do not crowd toes to reduce microtrauma -VA disability questionnaire completed on  patient's behalf noting his previous exposure to toxic chemicals and now his symptoms that he is reporting of occasional numbness to the bottoms of both feet -Advised good hygiene habits -Patient to return as needed or sooner if symptoms worsen.  [name], DPM

## 2021-06-10 DIAGNOSIS — M79676 Pain in unspecified toe(s): Secondary | ICD-10-CM

## 2021-06-19 ENCOUNTER — Encounter: Payer: Self-pay | Admitting: Gastroenterology

## 2021-07-24 ENCOUNTER — Encounter: Payer: Self-pay | Admitting: Physician Assistant

## 2021-07-24 ENCOUNTER — Ambulatory Visit (INDEPENDENT_AMBULATORY_CARE_PROVIDER_SITE_OTHER): Payer: Medicare Other | Admitting: Physician Assistant

## 2021-07-24 ENCOUNTER — Telehealth: Payer: Self-pay

## 2021-07-24 VITALS — BP 138/80 | HR 88 | Ht 68.0 in | Wt 244.0 lb

## 2021-07-24 DIAGNOSIS — I7774 Dissection of vertebral artery: Secondary | ICD-10-CM | POA: Diagnosis not present

## 2021-07-24 DIAGNOSIS — Z7901 Long term (current) use of anticoagulants: Secondary | ICD-10-CM | POA: Diagnosis not present

## 2021-07-24 DIAGNOSIS — Z86010 Personal history of colonic polyps: Secondary | ICD-10-CM | POA: Diagnosis not present

## 2021-07-24 DIAGNOSIS — D126 Benign neoplasm of colon, unspecified: Secondary | ICD-10-CM | POA: Insufficient documentation

## 2021-07-24 DIAGNOSIS — Z01818 Encounter for other preprocedural examination: Secondary | ICD-10-CM

## 2021-07-24 MED ORDER — NA SULFATE-K SULFATE-MG SULF 17.5-3.13-1.6 GM/177ML PO SOLN: 1.0000 | Freq: Once | ORAL | 0 refills | Status: AC

## 2021-07-24 NOTE — Patient Instructions (Addendum)
You have been scheduled for a colonoscopy. Please follow written instructions given to you at your visit today.  Please pick up your prep supplies at the pharmacy within the next 1-3 days. If you use inhalers (even only as needed), please bring them with you on the day of your procedure.  We have sent the following medications to your pharmacy for you to pick up at your convenience: [place] will be contaced by our office prior to your procedure for directions on holding your Plavix.  If you do not hear from our office 1 week prior to your scheduled procedure, please call [phone] to discuss.   If you are age 69 or older, your body mass index should be between 23-30. Your Body mass index is 37.1 kg/m. If this is out of the aforementioned range listed, please consider follow up with your Primary Care Provider  ________________________________________________________  The [facility] providers would like to encourage you to use [facility] to communicate with providers for non-urgent requests or questions.  Due to long hold times on the telephone, sending your provider a message by [facility] may be a faster and more efficient way to get a response.  Please allow 48 business hours for a response.  Please remember that this is for non-urgent requests.   Thank you for choosing me and [facility].  Ellouise Newer -PA

## 2021-07-24 NOTE — Progress Notes (Signed)
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 Silverio Decamp, who returns to clinic today for his history of adenomatous polyps.      07/30/2018 colonoscopy done for personal history of adenoma less than 10 mm in size on colonoscopy in 2013 with one 14 mm polyp in the proximal ascending colon, severe diverticulosis in the sigmoid, transverse and ascending colon, nonbleeding internal hemorrhoids and otherwise normal.  Pathology showed tubular adenoma and repeat was recommended in 3 years.    Today, the patient tells me he is doing well, no new complaints or concerns and as far as his health he is stable.  Tells me in fact that he went back to see his neurologist in regards to staying on the Plavix or not but was unable to see him.  He plans on making a follow-up appointment with them to make sure that he needs to stay on the Plavix forever.    Denies fever, chills, weight loss, change in bowel habits, abdominal pain, heartburn or reflux.  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 90 tablet 0   clopidogrel (PLAVIX) 75 MG tablet TAKE 1 TABLET BY MOUTH EVERY DAY 90 tablet 0   clotrimazole (LOTRIMIN) 1 % cream Apply 1 application topically 2 (two) times daily. To affected skin - continue for one week after resolution of rash 45 g 0   Cyanocobalamin (VITAMIN B-12  PO) Take 1 tablet by mouth daily.     glimepiride (AMARYL) 4 MG tablet TAKE 1 TABLET (4 MG TOTAL) BY MOUTH DAILY. WITH FOOD 90 tablet 0   meclizine (ANTIVERT) 25 MG tablet Take 1-2 tablets (25-50 mg total) by mouth 3 (three) times daily as needed for dizziness. 30 tablet 1   meloxicam (MOBIC) 15 MG tablet Take 1 tablet (15 mg total) by mouth daily as needed for pain. 30 tablet 3   metFORMIN (GLUCOPHAGE) 1000 MG tablet Take 1 tablet (1,000 mg total) by mouth 2 (two) times daily with a meal. 180 tablet 0   pravastatin (PRAVACHOL) 40 MG tablet Take 1 tablet (40 mg total) by mouth daily. 90 tablet 0   No current facility-administered medications for this visit.    Allergies as of 07/24/2021   (No Known Allergies)    Family History  Problem Relation Age of Onset   Stomach cancer Mother    Rectal cancer Father    Colon polyps Sister    Colon polyps Brother     Social History   Socioeconomic History   Marital status: Married    Spouse name: sharon   Number of children: 2   Years of education: college   Highest education level: Not on file  Occupational History    Employer: [employer]  Tobacco Use   Smoking status: Never  Smokeless tobacco: Never  Vaping Use   Vaping Use: Never used  Substance and Sexual Activity   Alcohol use: Yes    Alcohol/week: 0.0 standard drinks    Comment: occasional   Drug use: No   Sexual activity: Yes  Other Topics Concern   Not on file  Social History Narrative   Patient is right handed, resides with wife   1cup of coffee a day    Social Determinants of Health   Financial Resource Strain: Not on file  Food Insecurity: Not on file  Transportation Needs: Not on file  Physical Activity: Not on file  Stress: Not on file  Social Connections: Not on file  Intimate Partner Violence: Not on file    Review of Systems:    Constitutional: No weight loss, fever or chills Skin: No rash Cardiovascular: No chest pain Respiratory: No SOB   Gastrointestinal: See HPI and otherwise negative Genitourinary: No dysuria  Neurological: No headache, dizziness or syncope Musculoskeletal: No new muscle or joint pain Hematologic: No bleeding Psychiatric: No history of depression or anxiety   Physical Exam:  Vital signs: BP 138/80   Pulse 88   Ht 5\' 8"  (1.727 m)   Wt 244 lb (110.7 kg)   SpO2 98%   BMI 37.10 kg/m    Constitutional:   Pleasant AA male appears to be in NAD, Well developed, Well nourished, alert and cooperative Head:  Normocephalic and atraumatic. Eyes:   PEERL, EOMI. No icterus. Conjunctiva pink. Ears:  Normal auditory acuity. Neck:  Supple Throat: Oral cavity and pharynx without inflammation, swelling or lesion.  Respiratory: Respirations even and unlabored. Lungs clear to auscultation bilaterally.   No wheezes, crackles, or rhonchi.  Cardiovascular: Normal S1, S2. No MRG. Regular rate and rhythm. No peripheral edema, cyanosis or pallor.  Gastrointestinal:  Soft, nondistended, nontender. No rebound or guarding. Normal bowel sounds. No appreciable masses or hepatomegaly. Rectal:  Not performed.  Msk:  Symmetrical without gross deformities. Without edema, no deformity or joint abnormality.  Neurologic:  Alert and  oriented x4;  grossly normal neurologically.  Skin:   Dry and intact without significant lesions or rashes. Psychiatric: Demonstrates good judgement and reason without abnormal affect or behaviors.  No recent labs or imaging.  Assessment: 1.  History of adenomatous polyps: On first colonoscopy in 2013, then again in 2019 with recommendations for repeat in 3 years 2.  Chronic anticoagulation on Plavix: After dissection of vertebral artery  Plan: 1.  Patient scheduled for surveillance colonoscopy in the [place] with Dr. Silverio Decamp.  Did provide the patient a detailed list of risks for the procedure and he agrees to proceed. Patient is appropriate for endoscopic procedure(s) in the ambulatory ([place]) setting.   2.  Patient was advised to hold his Plavix for 5 days prior to time of procedure.  We will communicate with his prescribing physician to ensure this is acceptable for him. 3.  Patient follow in clinic per recommendations from Dr. Silverio Decamp after time of procedure  Ellouise Newer, PA-C [facility] 07/24/2021, 2:29 PM  Cc: Maximiano Coss, NP

## 2021-07-24 NOTE — Telephone Encounter (Signed)
-----   Message from Maximiano Coss, NP sent at 07/24/2021  3:37 PM EDT ----- Madaline Brilliant for pt Morene Rankins to hold plavix x 5 days prior to planned colonoscopy. May resume the day after the procedure so long as no complications arise.  Thank you  Rich  ----- Message ----- From: Debbe Mounts, [place] Sent: 07/24/2021   3:28 PM EDT To: Maximiano Coss, NP

## 2021-07-24 NOTE — Telephone Encounter (Signed)
Clearance letter for Plavix routed to Maximiano Coss NP asking for clearance to hold Plavix x5 days prior to colonoscopy scheduled for 08/19/21.

## 2021-07-24 NOTE — Telephone Encounter (Signed)
Left message for patient asking for return call back.

## 2021-07-30 NOTE — Telephone Encounter (Signed)
Patient informed okay to hold Plavix x5 days prior to procedure. Pt voiced understanding.

## 2021-08-19 ENCOUNTER — Ambulatory Visit (AMBULATORY_SURGERY_CENTER): Payer: Medicare Other | Admitting: Gastroenterology

## 2021-08-19 ENCOUNTER — Encounter: Payer: Self-pay | Admitting: Gastroenterology

## 2021-08-19 VITALS — BP 144/87 | HR 85 | Temp 96.0°F | Resp 14 | Ht 68.0 in | Wt 244.0 lb

## 2021-08-19 DIAGNOSIS — Z800 Family history of malignant neoplasm of digestive organs: Secondary | ICD-10-CM | POA: Diagnosis not present

## 2021-08-19 DIAGNOSIS — K635 Polyp of colon: Secondary | ICD-10-CM | POA: Diagnosis not present

## 2021-08-19 DIAGNOSIS — Z86010 Personal history of colonic polyps: Secondary | ICD-10-CM | POA: Diagnosis not present

## 2021-08-19 DIAGNOSIS — D120 Benign neoplasm of cecum: Secondary | ICD-10-CM

## 2021-08-19 DIAGNOSIS — K6389 Other specified diseases of intestine: Secondary | ICD-10-CM | POA: Diagnosis not present

## 2021-08-19 MED ORDER — SODIUM CHLORIDE 0.9 % IV SOLN: 500.0000 mL | Freq: Once | INTRAVENOUS | Status: DC

## 2021-08-19 NOTE — Patient Instructions (Signed)
Impression/Recommendations:  Polyp, diverticulosis, and hemorrhoid handouts given to patient.  Resume previous diet. Continue present medications. Await pathology results.  Repeat colonoscopy in 5 years for surveillance based on pathology results.  YOU HAD AN ENDOSCOPIC PROCEDURE TODAY AT [facility]:   Refer to the procedure report that was given to you for any specific questions about what was found during the examination.  If the procedure report does not answer your questions, please call your gastroenterologist to clarify.  If you requested that your care partner not be given the details of your procedure findings, then the procedure report has been included in a sealed envelope for you to review at your convenience later.  YOU SHOULD EXPECT: Some feelings of bloating in the abdomen. Passage of more gas than usual.  Walking can help get rid of the air that was put into your GI tract during the procedure and reduce the bloating. If you had a lower endoscopy (such as a colonoscopy or flexible sigmoidoscopy) you may notice spotting of blood in your stool or on the toilet paper. If you underwent a bowel prep for your procedure, you may not have a normal bowel movement for a few days.  Please Note:  You might notice some irritation and congestion in your nose or some drainage.  This is from the oxygen used during your procedure.  There is no need for concern and it should clear up in a day or so.  SYMPTOMS TO REPORT IMMEDIATELY:  Following lower endoscopy (colonoscopy or flexible sigmoidoscopy):  Excessive amounts of blood in the stool  Significant tenderness or worsening of abdominal pains  Swelling of the abdomen that is new, acute  Fever of 100F or higher  For urgent or emergent issues, a gastroenterologist can be reached at any hour by calling [phone]. Do not use MyChart messaging for urgent concerns.    DIET:  We do recommend a small meal at first, but then  you may proceed to your regular diet.  Drink plenty of fluids but you should avoid alcoholic beverages for 24 hours.  ACTIVITY:  You should plan to take it easy for the rest of today and you should NOT DRIVE or use heavy machinery until tomorrow (because of the sedation medicines used during the test).    FOLLOW UP: Our staff will call the number listed on your records 48-72 hours following your procedure to check on you and address any questions or concerns that you may have regarding the information given to you following your procedure. If we do not reach you, we will leave a message.  We will attempt to reach you two times.  During this call, we will ask if you have developed any symptoms of COVID 19. If you develop any symptoms (ie: fever, flu-like symptoms, shortness of breath, cough etc.) before then, please call [phone].  If you test positive for Covid 19 in the 2 weeks post procedure, please call and report this information to [place].    If any biopsies were taken you will be contacted by phone or by letter within the next 1-3 weeks.  Please call us at [phone] if you have not heard about the biopsies in 3 weeks.    SIGNATURES/CONFIDENTIALITY: You and/or your care partner have signed paperwork which will be entered into your electronic medical record.  These signatures attest to the fact that that the information above on your After Visit Summary has been reviewed and is understood.  Full responsibility  of the confidentiality of this discharge information lies with you and/or your care-partner.

## 2021-08-19 NOTE — Progress Notes (Signed)
Called to room to assist during endoscopic procedure.  Patient ID and intended procedure confirmed with present staff. Received instructions for my participation in the procedure from the performing physician.  

## 2021-08-19 NOTE — Progress Notes (Signed)
VS completed by DT.  Pt's states no medical or surgical changes since previsit or office visit.  

## 2021-08-19 NOTE — Op Note (Signed)
[place] Patient Name: Elchanan Bob Procedure Date: 08/19/2021 3:25 PM MRN: [id number] Endoscopist: Mauri Pole , MD Age: 70 Referring MD:  Date of Birth: 07/17/1951 Gender: Male Account #: [id number] Procedure:                Colonoscopy Indications:              Screening in patient at increased risk: Family                            history of 1st-degree relative with colorectal                            cancer, High risk colon cancer surveillance:                            Personal history of colonic polyps, High risk colon                            cancer surveillance: Personal history of adenoma                            (10 mm or greater in size) Medicines:                Monitored Anesthesia Care Procedure:                Pre-Anesthesia Assessment:                           - Prior to the procedure, a History and Physical                            was performed, and patient medications and                            allergies were reviewed. The patient's tolerance of                            previous anesthesia was also reviewed. The risks                            and benefits of the procedure and the sedation                            options and risks were discussed with the patient.                            All questions were answered, and informed consent                            was obtained. Prior Anticoagulants: The patient has                            taken no previous anticoagulant or antiplatelet  agents. ASA Grade Assessment: II - A patient with                            mild systemic disease. After reviewing the risks                            and benefits, the patient was deemed in                            satisfactory condition to undergo the procedure.                           After obtaining informed consent, the colonoscope                            was passed under direct vision.  Throughout the                            procedure, the patient's blood pressure, pulse, and                            oxygen saturations were monitored continuously. The                            Olympus PCF-[id number]) Colonoscope was                            introduced through the anus and advanced to the the                            cecum, identified by appendiceal orifice and                            ileocecal valve. The colonoscopy was performed                            without difficulty. The patient tolerated the                            procedure well. The quality of the bowel                            preparation was excellent. The ileocecal valve,                            appendiceal orifice, and rectum were photographed. Scope In: 3:28:38 PM Scope Out: 3:43:13 PM Scope Withdrawal Time: 0 hours 11 minutes 30 seconds  Total Procedure Duration: 0 hours 14 minutes 35 seconds  Findings:                 The perianal and digital rectal examinations were                            normal.  A 4 mm polyp was found in the cecum. The polyp was                            sessile. The polyp was removed with a cold snare.                            Resection and retrieval were complete.                           Scattered small and large-mouthed diverticula were                            found in the sigmoid colon and descending colon.                           Non-bleeding external and internal hemorrhoids were                            found during retroflexion. The hemorrhoids were                            small. Complications:            No immediate complications. Estimated Blood Loss:     Estimated blood loss was minimal. Impression:               - One 4 mm polyp in the cecum, removed with a cold                            snare. Resected and retrieved.                           - Moderate diverticulosis in the sigmoid colon and                             in the descending colon.                           - Non-bleeding external and internal hemorrhoids. Recommendation:           - Patient has a contact number available for                            emergencies. The signs and symptoms of potential                            delayed complications were discussed with the                            patient. Return to normal activities tomorrow.                            Written discharge instructions were provided to the  patient.                           - Resume previous diet.                           - Continue present medications.                           - Await pathology results.                           - Repeat colonoscopy in 5 years for surveillance                            based on pathology results. Mauri Pole, MD 08/19/2021 3:52:55 PM This report has been signed electronically.

## 2021-08-19 NOTE — Progress Notes (Signed)
[facility] History and Physical   Primary Care Physician:  Maximiano Coss, NP   Reason for Procedure:  History of adenomatous colon polyps  Plan:    Surveillance colonoscopy with possible interventions as needed     HPI: Kirk Oneal is a very pleasant 70 y.o. male here for surveillance colonoscopy. Denies any nausea, vomiting, abdominal pain, melena or bright red blood per rectum  The risks and benefits as well as alternatives of endoscopic procedure(s) have been discussed and reviewed. All questions answered. The patient agrees to proceed.    Past Medical History:  Diagnosis Date   Arthritis    Colon polyps    Diabetes mellitus without complication (HCC)    Dissection of vertebral artery ([place]) 05/17/2013   Diverticulosis    HLD (hyperlipidemia)    Sleep apnea    CPAP   Vitamin D deficiency     Past Surgical History:  Procedure Laterality Date   MENISCUS REPAIR Left     Prior to Admission medications   Medication Sig Start Date End Date Taking? Authorizing Provider  citalopram (CELEXA) 20 MG tablet Take 1 tablet (20 mg total) by mouth daily. 05/16/20   Maximiano Coss, NP  clopidogrel (PLAVIX) 75 MG tablet TAKE 1 TABLET BY MOUTH EVERY DAY 11/02/20   Maximiano Coss, NP  clotrimazole (LOTRIMIN) 1 % cream Apply 1 application topically 2 (two) times daily. To affected skin - continue for one week after resolution of rash 08/14/18   Emeterio Reeve, DO  Cyanocobalamin (VITAMIN B-12 PO) Take 1 tablet by mouth daily.    [provider]  glimepiride (AMARYL) 4 MG tablet TAKE 1 TABLET (4 MG TOTAL) BY MOUTH DAILY. WITH FOOD 11/02/20   Maximiano Coss, NP  meclizine (ANTIVERT) 25 MG tablet Take 1-2 tablets (25-50 mg total) by mouth 3 (three) times daily as needed for dizziness. 08/14/18   Emeterio Reeve, DO  meloxicam (MOBIC) 15 MG tablet Take 1 tablet (15 mg total) by mouth daily as needed for pain. 11/01/16   [name], MD  metFORMIN  (GLUCOPHAGE) 1000 MG tablet Take 1 tablet (1,000 mg total) by mouth 2 (two) times daily with a meal. 08/14/18   Emeterio Reeve, DO  pravastatin (PRAVACHOL) 40 MG tablet Take 1 tablet (40 mg total) by mouth daily. 08/14/18   Emeterio Reeve, DO    Current Outpatient Medications  Medication Sig Dispense Refill   citalopram (CELEXA) 20 MG tablet Take 1 tablet (20 mg total) by mouth daily. 90 tablet 0   clopidogrel (PLAVIX) 75 MG tablet TAKE 1 TABLET BY MOUTH EVERY DAY 90 tablet 0   clotrimazole (LOTRIMIN) 1 % cream Apply 1 application topically 2 (two) times daily. To affected skin - continue for one week after resolution of rash 45 g 0   Cyanocobalamin (VITAMIN B-12 PO) Take 1 tablet by mouth daily.     glimepiride (AMARYL) 4 MG tablet TAKE 1 TABLET (4 MG TOTAL) BY MOUTH DAILY. WITH FOOD 90 tablet 0   meclizine (ANTIVERT) 25 MG tablet Take 1-2 tablets (25-50 mg total) by mouth 3 (three) times daily as needed for dizziness. 30 tablet 1   meloxicam (MOBIC) 15 MG tablet Take 1 tablet (15 mg total) by mouth daily as needed for pain. 30 tablet 3   metFORMIN (GLUCOPHAGE) 1000 MG tablet Take 1 tablet (1,000 mg total) by mouth 2 (two) times daily with a meal. 180 tablet 0   pravastatin (PRAVACHOL) 40 MG tablet Take 1 tablet (40 mg total) by  mouth daily. 90 tablet 0   No current facility-administered medications for this visit.    Allergies as of 08/19/2021   (No Known Allergies)    Family History  Problem Relation Age of Onset   Stomach cancer Mother    Rectal cancer Father    Colon polyps Sister    Colon polyps Brother    Colon cancer Neg Hx     Social History   Socioeconomic History   Marital status: Married    Spouse name: sharon   Number of children: 2   Years of education: college   Highest education level: Not on file  Occupational History   Occupation: retired  Tobacco Use   Smoking status: Never   Smokeless tobacco: Never  Vaping Use   Vaping Use: Never used   Substance and Sexual Activity   Alcohol use: Yes    Alcohol/week: 0.0 standard drinks    Comment: occasional   Drug use: No   Sexual activity: Yes  Other Topics Concern   Not on file  Social History Narrative   Patient is right handed, resides with wife   1cup of coffee a day    Social Determinants of Health   Financial Resource Strain: Not on file  Food Insecurity: Not on file  Transportation Needs: Not on file  Physical Activity: Not on file  Stress: Not on file  Social Connections: Not on file  Intimate Partner Violence: Not on file    Review of Systems:  All other review of systems negative except as mentioned in the HPI.  Physical Exam: Vital signs in last 24 hours: BP 133/84   Pulse 97   Temp (!) 96 F (35.6 C) (Temporal)   Ht 5\' 8"  (1.727 m)   Wt 244 lb (110.7 kg)   SpO2 97%   BMI 37.10 kg/m     General:   Alert, NAD Lungs:  Clear .   Heart:  Regular rate and rhythm Abdomen:  Soft, nontender and nondistended. Neuro/Psych:  Alert and cooperative. Normal mood and affect. A and O x 3  Reviewed labs, radiology imaging, old records and pertinent past GI work up  Patient is appropriate for planned procedure(s) and anesthesia in an ambulatory setting   K. Denzil Magnuson , MD [phone]

## 2021-08-19 NOTE — Progress Notes (Signed)
PT taken to PACU. Monitors in place. VSS. Report given to RN. 

## 2021-08-21 ENCOUNTER — Telehealth: Payer: Self-pay | Admitting: *Deleted

## 2021-08-21 ENCOUNTER — Telehealth: Payer: Self-pay

## 2021-08-21 NOTE — Telephone Encounter (Signed)
  Follow up Call-  Call back number 08/19/2021  Post procedure Call Back phone  # [phone]  Permission to leave phone message Yes  Some recent data might be hidden     First attempt for follow up phone call. No answer at number given.  Left message on voicemail.

## 2021-08-21 NOTE — Telephone Encounter (Signed)
  Follow up Call-  Call back number 08/19/2021  Post procedure Call Back phone  # [phone]  Permission to leave phone message Yes  Some recent data might be hidden     Left message

## 2021-08-23 NOTE — Telephone Encounter (Signed)
Returning call to let us know he is doing good and everything went well with his procedure.

## 2021-09-03 ENCOUNTER — Encounter: Payer: Self-pay | Admitting: Gastroenterology

## 2021-09-27 NOTE — Progress Notes (Signed)
Reviewed and agree with documentation and assessment and plan. K. Veena Biddie Sebek , MD   

## 2021-10-18 ENCOUNTER — Ambulatory Visit (HOSPITAL_COMMUNITY)
Admission: EM | Admit: 2021-10-18 | Discharge: 2021-10-18 | Disposition: A | Discharge status: Home / Self Care | Payer: Non-veteran care | Attending: Student | Admitting: Student

## 2021-10-18 ENCOUNTER — Other Ambulatory Visit: Payer: Self-pay

## 2021-10-18 ENCOUNTER — Encounter (HOSPITAL_COMMUNITY): Payer: Self-pay

## 2021-10-18 DIAGNOSIS — Z760 Encounter for issue of repeat prescription: Secondary | ICD-10-CM | POA: Diagnosis not present

## 2021-10-18 DIAGNOSIS — Z7901 Long term (current) use of anticoagulants: Secondary | ICD-10-CM

## 2021-10-18 DIAGNOSIS — E1169 Type 2 diabetes mellitus with other specified complication: Secondary | ICD-10-CM

## 2021-10-18 DIAGNOSIS — E1159 Type 2 diabetes mellitus with other circulatory complications: Secondary | ICD-10-CM

## 2021-10-18 DIAGNOSIS — E0865 Diabetes mellitus due to underlying condition with hyperglycemia: Secondary | ICD-10-CM

## 2021-10-18 MED ORDER — GLIMEPIRIDE 4 MG PO TABS: 4.0000 mg | ORAL_TABLET | Freq: Every day | ORAL | 0 refills | Status: AC

## 2021-10-18 MED ORDER — CLOPIDOGREL BISULFATE 75 MG PO TABS: 75.0000 mg | ORAL_TABLET | Freq: Every day | ORAL | 0 refills | Status: AC

## 2021-10-18 NOTE — Discharge Instructions (Signed)
-  Continue daily Plavix and Glimepiride  -Establish care with new PCP

## 2021-10-18 NOTE — ED Triage Notes (Signed)
Pt presents with request for medication refill. Pt is in between PCP's and is almost out of the following: -plavix 75mg  -glimepiride 4mg 

## 2021-10-18 NOTE — ED Provider Notes (Signed)
[place]    CSN: [id number] Arrival date & time: 10/18/21  1636      History   Chief Complaint Chief Complaint  Patient presents with   Medication Refill    HPI LENVILLE HIBBERD is a 71 y.o. male presenting for refill on medications: plavix and glimepiride. Medical history vertebral artery dissection, diabetes. Has not yet run out of the medications. States has been on the medications for years, currently without a PCP but plans to find one soon. States feeling well today. Denies CP, SOB, dizziness.   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 Authorizing Provider  citalopram (CELEXA) 20 MG tablet Take 1 tablet (20 mg total) by mouth daily. 05/16/20   Maximiano Coss, NP  clopidogrel (PLAVIX) 75 MG tablet Take 1 tablet (75 mg total) by mouth daily. 10/18/21   Hazel Sams, PA-C  clotrimazole (LOTRIMIN) 1 % cream Apply 1 application topically 2 (two) times daily. To affected skin - continue for one week after resolution of rash 08/14/18   Emeterio Reeve, DO  Cyanocobalamin (VITAMIN B-12 PO) Take 1  tablet by mouth daily.    [provider]  glimepiride (AMARYL) 4 MG tablet Take 1 tablet (4 mg total) by mouth daily. with food 10/18/21   Hazel Sams, PA-C  meclizine (ANTIVERT) 25 MG tablet Take 1-2 tablets (25-50 mg total) by mouth 3 (three) times daily as needed for dizziness. 08/14/18   Emeterio Reeve, DO  meloxicam (MOBIC) 15 MG tablet Take 1 tablet (15 mg total) by mouth daily as needed for pain. 11/01/16   [name], MD  metFORMIN (GLUCOPHAGE) 1000 MG tablet Take 1 tablet (1,000 mg total) by mouth 2 (two) times daily with a meal. 08/14/18   Emeterio Reeve, DO  pravastatin (PRAVACHOL) 40 MG tablet Take 1 tablet (40 mg total) by mouth daily. 08/14/18   Emeterio Reeve, DO    Family History Family History  Problem Relation Age of Onset   Stomach cancer Mother    Rectal cancer Father    Colon polyps Sister    Colon polyps Brother    Colon cancer Neg Hx     Social History Social History   Tobacco Use   Smoking status: Never   Smokeless tobacco: Never  Vaping Use   Vaping Use: Never used  Substance Use Topics   Alcohol use: Yes    Alcohol/week: 0.0 standard drinks    Comment: occasional   Drug use: No  Allergies   Patient has no known allergies.   Review of Systems Review of Systems  Respiratory:  Negative for chest tightness and shortness of breath.   All other systems reviewed and are negative.   Physical Exam Triage Vital Signs ED Triage Vitals  Enc Vitals Group     BP      Pulse      Resp      Temp      Temp src      SpO2      Weight      Height      Head Circumference      Peak Flow      Pain Score      Pain Loc      Pain Edu?      Excl. in [place]?    No data found.  Updated Vital Signs BP (!) 168/87 (BP Location: Left Arm)    Pulse 62    Temp 98 F (36.7 C) (Oral)    Resp 14    SpO2 100%   Visual Acuity Right Eye Distance:   Left Eye Distance:   Bilateral Distance:    Right Eye Near:   Left Eye Near:     Bilateral Near:     Physical Exam Vitals reviewed.  Constitutional:      Appearance: Normal appearance. He is not diaphoretic.  HENT:     Head: Normocephalic and atraumatic.     Mouth/Throat:     Mouth: Mucous membranes are moist.  Eyes:     Extraocular Movements: Extraocular movements intact.     Pupils: Pupils are equal, round, and reactive to light.  Cardiovascular:     Rate and Rhythm: Normal rate and regular rhythm.     Pulses:          Radial pulses are 2+ on the right side and 2+ on the left side.     Heart sounds: Normal heart sounds.  Pulmonary:     Effort: Pulmonary effort is normal.     Breath sounds: Normal breath sounds.  Abdominal:     Palpations: Abdomen is soft.     Tenderness: There is no abdominal tenderness. There is no guarding or rebound.  Musculoskeletal:     Right lower leg: No edema.     Left lower leg: No edema.  Skin:    General: Skin is warm.     Capillary Refill: Capillary refill takes less than 2 seconds.  Neurological:     General: No focal deficit present.     Mental Status: He is alert and oriented to person, place, and time.  Psychiatric:        Mood and Affect: Mood normal.        Behavior: Behavior normal.        Thought Content: Thought content normal.        Judgment: Judgment normal.     UC Treatments / Results  Labs (all labs ordered are listed, but only abnormal results are displayed) Labs Reviewed - No data to display  EKG   Radiology No results found.  Procedures Procedures (including critical care time)  Medications Ordered in UC Medications - No data to display  Initial Impression / Assessment and Plan / UC Course  I have reviewed the triage vital signs and the nursing notes.  Pertinent labs & imaging results that were available during my care of the patient were reviewed by me and considered in my medical decision making (see  chart for details).     This patient is a very pleasant 71 y.o. year old male  presenting for medication refills - Plavix and glimepiride. He is between PCPs. Hasn't yet run out of meds. Refills sent x90 days. ED return precautions discussed. Patient verbalizes understanding and agreement.   .   Final Clinical Impressions(s) / UC Diagnoses   Final diagnoses:  Current use of long term anticoagulation  Medication refill  Type 2 diabetes mellitus with other specified complication, without long-term current use of insulin (HCC)     Discharge Instructions      -Continue daily Plavix and Glimepiride  -Establish care with new PCP     ED Prescriptions     Medication Sig Dispense Auth. Provider   clopidogrel (PLAVIX) 75 MG tablet Take 1 tablet (75 mg total) by mouth daily. 90 tablet Hazel Sams, PA-C   glimepiride (AMARYL) 4 MG tablet Take 1 tablet (4 mg total) by mouth daily. with food 90 tablet Hazel Sams, PA-C      PDMP not reviewed this encounter.   Hazel Sams, PA-C 10/18/21 1736

## 2022-01-30 ENCOUNTER — Ambulatory Visit (INDEPENDENT_AMBULATORY_CARE_PROVIDER_SITE_OTHER): Payer: Medicare Other | Admitting: Sports Medicine

## 2022-01-30 ENCOUNTER — Telehealth: Payer: Self-pay | Admitting: *Deleted

## 2022-01-30 ENCOUNTER — Encounter: Payer: Self-pay | Admitting: Sports Medicine

## 2022-01-30 DIAGNOSIS — M2022 Hallux rigidus, left foot: Secondary | ICD-10-CM

## 2022-01-30 DIAGNOSIS — M79674 Pain in right toe(s): Secondary | ICD-10-CM

## 2022-01-30 DIAGNOSIS — M2042 Other hammer toe(s) (acquired), left foot: Secondary | ICD-10-CM

## 2022-01-30 DIAGNOSIS — E119 Type 2 diabetes mellitus without complications: Secondary | ICD-10-CM

## 2022-01-30 DIAGNOSIS — M21619 Bunion of unspecified foot: Secondary | ICD-10-CM

## 2022-01-30 DIAGNOSIS — M79675 Pain in left toe(s): Secondary | ICD-10-CM

## 2022-01-30 DIAGNOSIS — M2142 Flat foot [pes planus] (acquired), left foot: Secondary | ICD-10-CM

## 2022-01-30 DIAGNOSIS — M2021 Hallux rigidus, right foot: Secondary | ICD-10-CM

## 2022-01-30 DIAGNOSIS — M2141 Flat foot [pes planus] (acquired), right foot: Secondary | ICD-10-CM

## 2022-01-30 DIAGNOSIS — B351 Tinea unguium: Secondary | ICD-10-CM | POA: Diagnosis not present

## 2022-01-30 DIAGNOSIS — M2041 Other hammer toe(s) (acquired), right foot: Secondary | ICD-10-CM

## 2022-01-30 NOTE — Progress Notes (Signed)
Subjective: ?Kirk Oneal is a 71 y.o. male patient seen today in office with complaint of mildly painful thickened and discolored nails. Patient is also requesting an update on his [employer] disability benefits questionnaire.  Patient also is diabetic blood sugar not recorded this visit and last A1c 7 PCP is noted as Dr. Ayesha Rumpf. ?Patient Active Problem List  ? Diagnosis Date Noted  ? Colon adenoma 07/24/2021  ? Chronic fatigue syndrome 01/08/2018  ? 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 Vitamin D deficiency 01/08/2018  ? Hyperlipidemia LDL goal <70 07/19/2017  ? Obstructive sleep apnea 07/19/2017  ? Obesity 07/19/2017  ? Diabetes mellitus without complication ([place]) 97/41/6384  ? Atherosclerosis 04/17/2014  ? 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 ? ?Current Outpatient Medications on File Prior to Visit  ?Medication Sig Dispense Refill  ? ammonium lactate (LAC-HYDRIN) 12 % lotion APPLY SMALL AMOUNT TOPICALLY TWO TIMES A DAY FOR DRY FEET    ? ketoconazole (NIZORAL) 2 % shampoo SHAMPOO SUFFICIENT AMOUNT TOPICALLY IN THE MORNING FROM SCALP TO LEGS FOR 7 DAYS, RINSE AFTER 5 MINUTES    ? lisinopril (ZESTRIL) 5 MG tablet TAKE ONE-HALF TABLET BY MOUTH EVERY DAY FOR BLOOD PRESSURE    ? atorvastatin (LIPITOR) 40 MG tablet Take by mouth.    ? citalopram (CELEXA) 20 MG tablet Take 1 tablet (20 mg total) by mouth daily. 90 tablet 0  ? clopidogrel (PLAVIX) 75 MG tablet Take 1 tablet (75 mg total) by mouth daily. 90 tablet 0  ? clotrimazole (LOTRIMIN) 1 % cream Apply 1 application topically 2 (two) times daily. To affected skin - continue for one week after resolution of rash 45 g 0  ? Cyanocobalamin (VITAMIN B-12 PO) Take 1 tablet by mouth daily.    ? glimepiride (AMARYL) 4 MG tablet Take 1 tablet (4 mg total) by mouth daily. with food 90 tablet 0  ? meclizine (ANTIVERT) 25 MG tablet Take 1-2 tablets (25-50 mg total) by mouth 3 (three) times daily as needed for dizziness. 30 tablet 1  ?  meloxicam (MOBIC) 15 MG tablet Take 1 tablet (15 mg total) by mouth daily as needed for pain. 30 tablet 3  ? metFORMIN (GLUCOPHAGE) 1000 MG tablet Take 1 tablet (1,000 mg total) by mouth 2 (two) times daily with a meal. 180 tablet 0  ? pravastatin (PRAVACHOL) 40 MG tablet Take 1 tablet (40 mg total) by mouth daily. 90 tablet 0  ? sildenafil (VIAGRA) 100 MG tablet Take by mouth.    ? ?No current facility-administered medications on file prior to visit.  ? ? ?No Known Allergies ? ?Objective: ?Physical Exam ? ?General: Well developed, nourished, no acute distress, awake, alert and oriented x 3 ? ?Vascular: Dorsalis pedis artery 2/4 bilateral, Posterior tibial artery 1/4 bilateral, skin temperature warm to warm proximal to distal bilateral lower extremities, no varicosities, pedal hair present bilateral. ? ?Neurological: Gross sensation present via light touch bilateral.  ? ?Dermatological: Skin is warm, dry, and supple bilateral, Nails 1-10 are tender, elongated and discolored with moderate subungal debris, no webspace macerations present bilateral, no open lesions present bilateral, no callus/corns/hyperkeratotic tissue present bilateral. No signs of infection bilateral. ? ?Musculoskeletal: + Bunion with limited first MPJ range of motion, hammertoe, pes planus boney deformities noted bilateral. Muscular strength within normal limits without pain on range of motion however upon long periods of standing patient does experience some fatigue. No pain with calf compression bilateral. ? ?Assessment and Plan:  ?Problem List  Items Addressed This Visit   ? ?  ? Endocrine  ? Diabetes mellitus without complication ([place])  ? Relevant Medications  ? atorvastatin (LIPITOR) 40 MG tablet  ? lisinopril (ZESTRIL) 5 MG tablet  ? ?Other Visit Diagnoses   ? ? Pain due to onychomycosis of toenails of both feet    -  Primary  ? Relevant Medications  ? ketoconazole (NIZORAL) 2 % shampoo  ? Pes planus of both feet      ? Bunion      ? Hammer  toes of both feet      ? Hallux rigidus of both feet      ? ?  ? ? ?-Examined patient ?-Discussed treatment options for painful mycotic nails ?-Mechanically debrided all nails that are painful x10 using a sterile nail nipper without incident ?-Advised good supportive shoes daily for foot type and disability benefits questionnaire for the [employer] completed on patient's behalf ?-Encourage patient to inspect his feet daily and to return to office on a more consistent basis every 3 months for at risk diabetic foot care ?-Patient to return as scheduled or sooner if problems or issues arise. ? ?[name], DPM ? ?

## 2022-01-30 NOTE — Telephone Encounter (Signed)
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 He verbalized understanding and wanted to thank her. ?

## 2022-03-17 ENCOUNTER — Encounter: Payer: Self-pay | Admitting: *Deleted

## 2022-03-18 ENCOUNTER — Encounter: Payer: Self-pay | Admitting: Diagnostic Neuroimaging

## 2022-03-18 ENCOUNTER — Ambulatory Visit (INDEPENDENT_AMBULATORY_CARE_PROVIDER_SITE_OTHER): Payer: Medicare Other | Admitting: Diagnostic Neuroimaging

## 2022-03-18 VITALS — BP 141/85 | HR 90 | Ht 68.0 in | Wt 240.8 lb

## 2022-03-18 DIAGNOSIS — M542 Cervicalgia: Secondary | ICD-10-CM | POA: Diagnosis not present

## 2022-03-18 DIAGNOSIS — I7774 Dissection of vertebral artery: Secondary | ICD-10-CM | POA: Diagnosis not present

## 2022-05-06 ENCOUNTER — Encounter: Payer: Self-pay | Admitting: Podiatry

## 2022-05-06 ENCOUNTER — Ambulatory Visit (INDEPENDENT_AMBULATORY_CARE_PROVIDER_SITE_OTHER): Payer: Medicare Other | Admitting: Podiatry

## 2022-05-06 DIAGNOSIS — M79674 Pain in right toe(s): Secondary | ICD-10-CM

## 2022-05-06 DIAGNOSIS — B351 Tinea unguium: Secondary | ICD-10-CM | POA: Diagnosis not present

## 2022-05-06 DIAGNOSIS — M79675 Pain in left toe(s): Secondary | ICD-10-CM | POA: Diagnosis not present

## 2022-05-11 NOTE — Progress Notes (Signed)
ANNUAL DIABETIC FOOT EXAM  Subjective: Kirk Oneal presents today for annual diabetic foot examination.  Patient confirms h/o diabetes.  Patient denies any h/o foot wounds.  Patient denies any numbness, tingling, burning, or pins/needle sensation in feet.  Last known  HgA1c was 7.0%. Patient did not check blood glucose this morning.  Risk factors: diabetes, neuropathy, hyperlipidemia.  Lin Landsman, MD is patient's PCP. Last visit was May, 2023.  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   Vitamin D deficiency 01/08/2018   Hyperlipidemia LDL goal <70 07/19/2017   Obstructive sleep apnea 07/19/2017   Obesity 07/19/2017   Diabetes mellitus without complication ([place]) 53/66/4403   Atherosclerosis 04/17/2014   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     citalopram (CELEXA) 20 MG tablet Take 1 tablet (20 mg total) by mouth daily. 90 tablet 0   clopidogrel (PLAVIX) 75 MG tablet Take 1 tablet (75 mg total) by mouth daily. 90 tablet 0   clotrimazole (LOTRIMIN) 1 % cream Apply 1 application topically 2 (two) times daily. To affected skin - continue for  one week after resolution of rash 45 g 0   Cyanocobalamin (VITAMIN B-12 PO) Take 1 tablet by mouth daily.     glimepiride (AMARYL) 4 MG tablet Take 1 tablet (4 mg total) by mouth daily. with food 90 tablet 0   ketoconazole (NIZORAL) 2 % shampoo SHAMPOO SUFFICIENT AMOUNT TOPICALLY IN THE MORNING FROM SCALP TO LEGS FOR 7 DAYS, RINSE AFTER 5 MINUTES     lisinopril (ZESTRIL) 5 MG tablet TAKE ONE-HALF TABLET BY MOUTH EVERY DAY FOR BLOOD PRESSURE     meclizine (ANTIVERT) 25 MG tablet Take 1-2 tablets (25-50 mg total) by mouth 3 (three) times daily as needed for dizziness. 30 tablet 1   meloxicam (MOBIC) 15 MG tablet Take 1 tablet (15 mg total) by mouth daily as needed for pain. 30 tablet 3   metFORMIN (GLUCOPHAGE) 1000 MG tablet Take 1 tablet (1,000 mg total) by mouth 2 (two) times daily with a meal. 180 tablet 0   pravastatin (PRAVACHOL) 40 MG tablet Take 1 tablet (40 mg total) by mouth daily. 90 tablet 0   sildenafil (VIAGRA) 100 MG tablet Take by mouth.     No current facility-administered medications on file prior to visit.    No Known Allergies Social History   Occupational History   Occupation: retired  Tobacco Use   Smoking status: Never   Smokeless tobacco: Never  Vaping Use   Vaping Use: Never used  Substance  and Sexual Activity   Alcohol use: Yes    Alcohol/week: 0.0 standard drinks of alcohol    Comment: occasional   Drug use: No   Sexual activity: Yes   Family History  Problem Relation Age of Onset   Stomach cancer Mother    Rectal cancer Father    Colon polyps Sister    Colon polyps Brother    Colon cancer Neg Hx    Immunization History  Administered Date(s) Administered   Tdap 09/22/2012     Review of Systems: Negative except as noted in the HPI.   Objective: There were no vitals filed for this visit.  Kirk Oneal is a pleasant 71 y.o. male in NAD. AAO X 3.  Vascular Examination: CFT <3 seconds b/l LE. Palpable pedal pulses b/l LE. Pedal hair sparse. No  pain with calf compression b/l. Lower extremity skin temperature gradient within normal limits. No cyanosis or clubbing noted b/l LE.  Dermatological Examination: Pedal integument with normal turgor, texture and tone b/l LE. No open wounds b/l. No interdigital macerations b/l. Toenails 1-5 b/l elongated, thickened, discolored with subungual debris. +Tenderness with dorsal palpation of nailplates. No hyperkeratotic or porokeratotic lesions present.  Neurological Examination: Protective sensation intact 5/5 intact bilaterally with 10g monofilament b/l. Vibratory sensation intact b/l. Proprioception intact bilaterally.  Musculoskeletal Examination: Muscle strength 5/5 to all lower extremity muscle groups bilaterally. No gross bony deformities bilaterally. Pes planus deformity noted bilateral LE.  Footwear Assessment: Does the patient wear appropriate shoes? Yes. Does the patient need inserts/orthotics? Yes.  ADA Risk Categorization: Low Risk :  Patient has all of the following: Intact protective sensation No prior foot ulcer  No severe deformity Pedal pulses present  Assessment: 1. Pain due to onychomycosis of toenails of both feet     Plan: -Patient was evaluated and treated. All patient's and/or POA's questions/concerns answered on today's visit. -Diabetic foot examination performed today. -Continue foot and shoe inspections daily. Monitor blood glucose per PCP/Endocrinologist's recommendations. -Mycotic toenails 1-5 bilaterally were debrided in length and girth with sterile nail nippers and dremel without incident. -Patient/POA to call should there be question/concern in the interim. Return in about 3 months (around 08/06/2022).  Marzetta Board, DPM

## 2022-05-21 ENCOUNTER — Telehealth: Payer: Self-pay | Admitting: Podiatry

## 2022-05-21 NOTE — Telephone Encounter (Signed)
[name] stated he dropped off a form from the [place] last week for you to complete. I don't have any knowledge of the form, so I was just wondering if you had it. I told [name] that you are out the office this week and next, but I would try to contact you.Please advise?

## 2022-05-30 ENCOUNTER — Ambulatory Visit (HOSPITAL_COMMUNITY)
Admission: EM | Admit: 2022-05-30 | Discharge: 2022-05-30 | Disposition: A | Discharge status: Home / Self Care | Payer: Medicare Other

## 2022-05-30 ENCOUNTER — Encounter (HOSPITAL_COMMUNITY): Payer: Self-pay

## 2022-05-30 DIAGNOSIS — E11649 Type 2 diabetes mellitus with hypoglycemia without coma: Secondary | ICD-10-CM

## 2022-05-30 DIAGNOSIS — Z8639 Personal history of other endocrine, nutritional and metabolic disease: Secondary | ICD-10-CM | POA: Diagnosis not present

## 2022-05-30 DIAGNOSIS — R5383 Other fatigue: Secondary | ICD-10-CM | POA: Diagnosis not present

## 2022-05-30 DIAGNOSIS — E162 Hypoglycemia, unspecified: Secondary | ICD-10-CM

## 2022-05-30 LAB — CBG MONITORING, ED
Glucose-Capillary: 63 mg/dL — ABNORMAL LOW (ref 70–99)
Glucose-Capillary: 82 mg/dL (ref 70–99)

## 2022-05-30 NOTE — Discharge Instructions (Addendum)
Please monitor your blood sugars at home. I recommend eating 3 healthy meals a day. Otherwise, try to have a healthy snack every few hours to keep your sugars stabilized. This may help with your fatigue.  I do recommend following up with your primary care provider regarding your diabetes.

## 2022-05-30 NOTE — ED Triage Notes (Signed)
Pt has been fatigue and unable to sleep at night x2days

## 2022-05-30 NOTE — ED Provider Notes (Signed)
[place]    CSN: [id number] Arrival date & time: 05/30/22  1556      History   Chief Complaint Chief Complaint  Patient presents with   Fatigue    HPI Kirk Oneal is a 71 y.o. male.  Presents with 2-day history of fatigue, trouble sleeping last night.  History of diabetes for which he takes metformin and glimepiride.  He has been taking these medicines for many months without concern.  A1c 2 months ago was around 7.  CBG on arrival was 63.  Patient reports he was feeling fine apart from the fatigue.  He had a sandwich earlier this morning but has not eaten since then and it is now 6 PM.  Reports he does not usually eat multiple meals in a day.  Does not check his sugars at home.  Denies any problems with his blood sugars in the past.  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 Authorizing Provider  ammonium lactate (LAC-HYDRIN) 12  % lotion APPLY SMALL AMOUNT TOPICALLY TWO TIMES A DAY FOR DRY FEET 04/18/22  Yes [provider]  atorvastatin (LIPITOR) 40 MG tablet TAKE ONE-HALF TABLET BY MOUTH ONCE EVERY DAY FOR CHOLESTEROL 04/18/22  Yes [provider]  citalopram (CELEXA) 40 MG tablet TAKE ONE TABLET BY MOUTH ONCE EVERY DAY FOR DEPRESSION 03/24/22  Yes [provider]  ammonium lactate (LAC-HYDRIN) 12 % lotion APPLY SMALL AMOUNT TOPICALLY TWO TIMES A DAY FOR DRY FEET 01/01/22   [provider]  atorvastatin (LIPITOR) 40 MG tablet Take by mouth. 01/02/22   [provider]  citalopram (CELEXA) 20 MG tablet Take 1 tablet (20 mg total) by mouth daily. 05/16/20   Maximiano Coss, NP  clopidogrel (PLAVIX) 75 MG tablet Take 1 tablet (75 mg total) by mouth daily. 10/18/21   Hazel Sams, PA-C  clotrimazole (LOTRIMIN) 1 % cream Apply 1 application topically 2 (two) times daily. To affected skin - continue for one week after resolution of rash 08/14/18   Emeterio Reeve, DO  Cyanocobalamin (VITAMIN B-12 PO) Take 1 tablet by mouth daily.    [provider]  glimepiride (AMARYL) 4 MG tablet Take 1 tablet (4 mg total) by mouth daily. with food 10/18/21   Hazel Sams, PA-C  ketoconazole (NIZORAL) 2 % shampoo SHAMPOO  SUFFICIENT AMOUNT TOPICALLY IN THE MORNING FROM SCALP TO LEGS FOR 7 DAYS, RINSE AFTER 5 MINUTES 01/28/21   [provider]  lisinopril (ZESTRIL) 5 MG tablet TAKE ONE-HALF TABLET BY MOUTH EVERY DAY FOR BLOOD PRESSURE 01/01/22   [provider]  meclizine (ANTIVERT) 25 MG tablet Take 1-2 tablets (25-50 mg total) by mouth 3 (three) times daily as needed for dizziness. 08/14/18   Emeterio Reeve, DO  meloxicam (MOBIC) 15 MG tablet Take 1 tablet (15 mg total) by mouth daily as needed for pain. 11/01/16   [name], MD  metFORMIN (GLUCOPHAGE) 1000 MG tablet Take 1 tablet (1,000 mg total) by mouth 2 (two) times daily with a meal. 08/14/18   Emeterio Reeve, DO   pravastatin (PRAVACHOL) 40 MG tablet Take 1 tablet (40 mg total) by mouth daily. 08/14/18   Emeterio Reeve, DO  sildenafil (VIAGRA) 100 MG tablet Take by mouth. 11/18/21   [provider]    Family History Family History  Problem Relation Age of Onset   Stomach cancer Mother    Rectal cancer Father    Colon polyps Sister    Colon polyps Brother    Colon cancer Neg Hx     Social History Social History   Tobacco Use   Smoking status: Never   Smokeless tobacco: Never  Vaping Use   Vaping Use: Never used  Substance Use Topics   Alcohol use: Yes    Alcohol/week: 0.0 standard drinks of alcohol    Comment: occasional   Drug use: No     Allergies   Patient has no known allergies.   Review of Systems Review of Systems Per HPI  Physical Exam Triage Vital Signs ED Triage Vitals  Enc Vitals Group     BP 05/30/22 1742 (!) 154/84     Pulse Rate 05/30/22 1742 77     Resp 05/30/22 1742 12     Temp 05/30/22 1742 98.2 F (36.8 C)     Temp Source 05/30/22 1742 Oral     SpO2 05/30/22 1742 98 %     Weight 05/30/22 1739 248 lb (112.5 kg)     Height 05/30/22 1739 '5\' 8"'$  (1.727 m)     Head Circumference --      Peak Flow --      Pain Score 05/30/22 1739 0     Pain Loc --      Pain Edu? --      Excl. in [place]? --    No data found.  Updated Vital Signs BP (!) 154/84 (BP Location: Left Arm)   Pulse 77   Temp 98.2 F (36.8 C) (Oral)   Resp 12   Ht '5\' 8"'$  (1.727 m)   Wt 248 lb (112.5 kg)   SpO2 98%   BMI 37.71 kg/m     Physical Exam Vitals and nursing note reviewed.  Constitutional:      General: He is not in acute distress.    Appearance: Normal appearance. He is well-developed.     Comments: Very pleasant and polite, in no acute distress  HENT:     Mouth/Throat:     Pharynx: Oropharynx is clear.  Eyes:     Conjunctiva/sclera: Conjunctivae normal.     Pupils: Pupils are equal, round, and reactive to light.  Cardiovascular:     Rate and Rhythm: Normal  rate and regular rhythm.     Pulses: Normal pulses.     Heart sounds: Normal heart sounds.  Pulmonary:  Effort: Pulmonary effort is normal. No respiratory distress.     Breath sounds: Normal breath sounds.  Abdominal:     Tenderness: There is no abdominal tenderness.  Musculoskeletal:     Cervical back: Normal range of motion.  Lymphadenopathy:     Cervical: No cervical adenopathy.  Neurological:     General: No focal deficit present.     Mental Status: He is alert and oriented to person, place, and time.     Sensory: Sensation is intact.     Motor: Motor function is intact.     Coordination: Coordination is intact.     Gait: Gait is intact.     UC Treatments / Results  Labs (all labs ordered are listed, but only abnormal results are displayed) Labs Reviewed  CBG MONITORING, ED - Abnormal; Notable for the following components:      Result Value   Glucose-Capillary 63 (*)    All other components within normal limits  CBG MONITORING, ED    EKG   Radiology No results found.  Procedures Procedures (including critical care time)  Medications Ordered in UC Medications - No data to display  Initial Impression / Assessment and Plan / UC Course  I have reviewed the triage vital signs and the nursing notes.  Pertinent labs & imaging results that were available during my care of the patient were reviewed by me and considered in my medical decision making (see chart for details).  Patient very well appearing, no acute distress, alert and oriented.  CBG initially 63, given ginger ale and increased to 82. Discussed with patient his fatigue could be caused by the low blood sugar.  We discussed eating 3 meals a day, if this is not possible he can try healthy snacking throughout the day every few hours to see if he can stabilize his blood sugars.  Patient will call his primary care provider on Monday and set up an appointment for sometime this month regarding diabetes management  and medications.  We discussed stabilizing blood glucose first and then trying sleep aid such as melatonin before bedtime.  Patient is agreeable to plan and reports he will be going to get something to eat as soon as he leaves the clinic.  Return precautions and ED precautions were discussed. Stable and well for discharge home  Final Clinical Impressions(s) / UC Diagnoses   Final diagnoses:  Other fatigue  History of diabetes mellitus  Hypoglycemia     Discharge Instructions      Please monitor your blood sugars at home. I recommend eating 3 healthy meals a day. Otherwise, try to have a healthy snack every few hours to keep your sugars stabilized. This may help with your fatigue.  I do recommend following up with your primary care provider regarding your diabetes.    ED Prescriptions   None    PDMP not reviewed this encounter.   Kyra Leyland 05/30/22 1913

## 2022-08-22 ENCOUNTER — Encounter: Payer: Self-pay | Admitting: Podiatry

## 2022-08-22 ENCOUNTER — Ambulatory Visit (INDEPENDENT_AMBULATORY_CARE_PROVIDER_SITE_OTHER): Payer: Medicare Other | Admitting: Podiatry

## 2022-08-22 VITALS — BP 178/95

## 2022-08-22 DIAGNOSIS — E119 Type 2 diabetes mellitus without complications: Secondary | ICD-10-CM

## 2022-08-22 DIAGNOSIS — M79675 Pain in left toe(s): Secondary | ICD-10-CM | POA: Diagnosis not present

## 2022-08-22 DIAGNOSIS — B360 Pityriasis versicolor: Secondary | ICD-10-CM | POA: Insufficient documentation

## 2022-08-22 DIAGNOSIS — B351 Tinea unguium: Secondary | ICD-10-CM

## 2022-08-22 DIAGNOSIS — M79674 Pain in right toe(s): Secondary | ICD-10-CM | POA: Diagnosis not present

## 2022-08-24 NOTE — Progress Notes (Signed)
  Subjective:  Patient ID: Kirk Oneal, male    DOB: 04/09/1951,  MRN: [id number]  Kirk Oneal presents to clinic today for preventative diabetic foot care and painful thick toenails that are difficult to trim. Pain interferes with ambulation. Aggravating factors include wearing enclosed shoe gear. Pain is relieved with periodic professional debridement.   Patient would like [facility] paperwork completed regarding his dx of flat feet and hallux limitus. Chief Complaint  Patient presents with   Nail Problem    Diabetic foot care BS-did not check A1C-7.0 PCP-Betti Reese PCP VST-4 months ago   New problem(s): None.   PCP is Lin Landsman, MD.  No Known Allergies  Review of Systems: Negative except as noted in the HPI.  Objective: No changes noted in today's physical examination. Vitals:   08/22/22 1115  BP: (!) 178/95   Kirk Oneal is a pleasant 71 y.o. male obese in NAD. AAO x 3.  Vascular Examination: CFT <3 seconds b/l LE. Palpable pedal pulses b/l LE. Pedal hair sparse. No pain with calf compression b/l. Lower extremity skin temperature gradient within normal limits. No cyanosis or clubbing noted b/l LE.  Dermatological Examination: Pedal integument with normal turgor, texture and tone b/l LE. No open wounds b/l. No interdigital macerations b/l. Toenails 1-5 b/l elongated, thickened, discolored with subungual debris. +Tenderness with dorsal palpation of nailplates. No hyperkeratotic or porokeratotic lesions present.  Neurological Examination: Protective sensation intact 5/5 intact bilaterally with 10g monofilament b/l. Vibratory sensation intact b/l. Proprioception intact bilaterally.  Musculoskeletal Examination: Muscle strength 5/5 to all lower extremity muscle groups bilaterally. Limited joint ROM to the 1st MPJ right foot. Pes planus deformity noted bilateral LE.  Assessment/Plan: 1. Pain due to onychomycosis of toenails of both feet   2. Diabetes mellitus  without complication ([place])     No orders of the defined types were placed in this encounter.   -Consent given for treatment as described below: -Examined patient. -Received VA paperwork. Will complete and contact patient when done. -Continue diabetic foot care principles: inspect feet daily, monitor glucose as recommended by PCP and/or Endocrinologist, and follow prescribed diet per PCP, Endocrinologist and/or dietician. -Toenails 1-5 b/l were debrided in length and girth with sterile nail nippers and dremel without iatrogenic bleeding.  -Patient/POA to call should there be question/concern in the interim.   Return in about 3 months (around 11/21/2022).  Marzetta Board, DPM

## 2022-12-05 ENCOUNTER — Encounter: Payer: Self-pay | Admitting: Podiatry

## 2022-12-12 ENCOUNTER — Ambulatory Visit: Payer: 59 | Admitting: Podiatry

## 2022-12-15 ENCOUNTER — Ambulatory Visit (INDEPENDENT_AMBULATORY_CARE_PROVIDER_SITE_OTHER): Payer: Medicare Other | Admitting: Podiatrist

## 2022-12-15 ENCOUNTER — Encounter: Payer: Self-pay | Admitting: Podiatrist

## 2022-12-15 DIAGNOSIS — M2021 Hallux rigidus, right foot: Secondary | ICD-10-CM

## 2022-12-15 DIAGNOSIS — M21619 Bunion of unspecified foot: Secondary | ICD-10-CM

## 2022-12-15 DIAGNOSIS — M79674 Pain in right toe(s): Secondary | ICD-10-CM | POA: Diagnosis not present

## 2022-12-15 DIAGNOSIS — M2022 Hallux rigidus, left foot: Secondary | ICD-10-CM

## 2022-12-15 DIAGNOSIS — M2141 Flat foot [pes planus] (acquired), right foot: Secondary | ICD-10-CM

## 2022-12-15 DIAGNOSIS — M2142 Flat foot [pes planus] (acquired), left foot: Secondary | ICD-10-CM

## 2022-12-15 DIAGNOSIS — B351 Tinea unguium: Secondary | ICD-10-CM | POA: Diagnosis not present

## 2022-12-15 DIAGNOSIS — M79675 Pain in left toe(s): Secondary | ICD-10-CM

## 2022-12-15 NOTE — Progress Notes (Signed)
Chief Complaint  Patient presents with   Nail Problem    [facility] BS-did not check today A1C-7.0 PCP-Reese PCP VST-6 months ago     Subjective:  Patient ID: Kirk Oneal, male    DOB: Nov 03, 1950,  MRN: [id number]  Kirk Oneal presents to clinic today for preventative diabetic foot care and painful thick toenails that are difficult to trim. Pain interferes with ambulation. Aggravating factors include wearing enclosed shoe gear. Pain is relieved with periodic professional debridement.   Patient would like VA paperwork completed regarding his dx of flat feet and hallux limitus-  he related he gave [place] the paperwork but has not received a call if this is done.    New problem(s): None.   PCP is Lin Landsman, MD.  No Known Allergies  Review of Systems: Negative except as noted in the HPI.  Objective: No changes noted in today's physical examination. There were no vitals filed for this visit.  Kirk Oneal is a pleasant 72 y.o. male obese in NAD. AAO x 3.  Vascular Examination: CFT <3 seconds b/l LE. Palpable pedal pulses b/l LE. Pedal hair sparse. No pain with calf compression b/l. Lower extremity skin temperature gradient within normal limits. No cyanosis or clubbing noted b/l LE.  Dermatological Examination: Pedal integument with normal turgor, texture and tone b/l LE. No open wounds b/l. No interdigital macerations b/l. Toenails 1-5 b/l elongated, thickened, discolored with subungual debris. +Tenderness with dorsal palpation of nailplates. No hyperkeratotic or porokeratotic lesions present.  Neurological Examination: Protective sensation intact 5/5 intact bilaterally with 10g monofilament b/l. Vibratory sensation intact b/l. Proprioception intact bilaterally.  Musculoskeletal Examination: Muscle strength 5/5 to all lower extremity muscle groups bilaterally. Limited joint ROM to the 1st MPJ right foot. Pes planus deformity noted bilateral LE.  Assessment/Plan:   ICD-10-CM    1. Pain due to onychomycosis of toenails of both feet  B35.1    M79.675    M79.674     2. Bunion  M21.619     3. Hallux rigidus of both feet  M20.21    M20.22     4. Pes planus of both feet  M21.41    M21.42         Debridement of toenails was recommended.  Onychoreduction of symptomatic toenails was performed via nail nipper and power burr without iatrogenic incident.  Patient was instructed on signs and symptoms of infection and was told to call immediately should any of these arise.  Recommended follow up in 3 months or instructed to call sooner if any pedal concerns arise.   I will check about the paperwork and we will let him know if it has been sent in.   Kirk Oneal, DPM

## 2023-01-08 ENCOUNTER — Telehealth: Payer: Self-pay | Admitting: Diagnostic Neuroimaging

## 2023-01-08 NOTE — Telephone Encounter (Signed)
Pt states he has recently been told that he has to come off the blood thinner because he is on Meloxicam, pt would like to get Dr Richrd Humbles thoughts on that.

## 2023-01-08 NOTE — Telephone Encounter (Signed)
Contacted pt back, he stated he was told by his [place] doctor that he needs to come off of Meloxicam because he is on Plavix and they can be contraindicating. 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 He has been on both meds for over 5 years. Marland Kitchen

## 2023-01-08 NOTE — Telephone Encounter (Signed)
Contacted pt back, inform him of MD recommendations. He verbally understood and was appreciative.

## 2023-04-21 ENCOUNTER — Ambulatory Visit: Payer: 59 | Admitting: Podiatry

## 2023-04-22 ENCOUNTER — Ambulatory Visit (INDEPENDENT_AMBULATORY_CARE_PROVIDER_SITE_OTHER): Payer: Medicare Other | Admitting: Podiatry

## 2023-04-22 ENCOUNTER — Encounter: Payer: Self-pay | Admitting: Podiatry

## 2023-04-22 DIAGNOSIS — B351 Tinea unguium: Secondary | ICD-10-CM

## 2023-04-22 DIAGNOSIS — M79675 Pain in left toe(s): Secondary | ICD-10-CM

## 2023-04-22 DIAGNOSIS — E119 Type 2 diabetes mellitus without complications: Secondary | ICD-10-CM

## 2023-04-22 DIAGNOSIS — M79674 Pain in right toe(s): Secondary | ICD-10-CM | POA: Diagnosis not present

## 2023-04-27 NOTE — Progress Notes (Signed)
  Subjective:  Patient ID: Kirk Oneal, male    DOB: [birth year],  MRN: [id number]  Kirk Oneal presents to clinic today for preventative diabetic foot care and painful elongated mycotic toenails 1-5 bilaterally which are tender when wearing enclosed shoe gear. Pain is relieved with periodic professional debridement.  Chief Complaint  Patient presents with   Nail Problem    dfc   New problem(s): None.   PCP is Leilani Able, MD.  No Known Allergies  Review of Systems: Negative except as noted in the HPI.  Objective: No changes noted in today's physical examination. There were no vitals filed for this visit. Kirk Oneal is a pleasant 72 y.o. male obese in NAD. AAO x 3.  Vascular Examination: Capillary refill time immediate b/l. Vascular status intact b/l with palpable pedal pulses. Pedal hair present b/l. No pain with calf compression b/l. Skin temperature gradient WNL b/l. No cyanosis or clubbing b/l. No ischemia or gangrene noted b/l. No edema noted b/l LE.  Neurological Examination: Sensation grossly intact b/l with 10 gram monofilament. Vibratory sensation intact b/l.   Dermatological Examination: Pedal skin with normal turgor, texture and tone b/l.  No open wounds. No interdigital macerations.   Toenails 1-5 b/l thick, discolored, elongated with subungual debris and pain on dorsal palpation.   No corns, calluses nor porokeratotic lesions noted.  Musculoskeletal Examination: Muscle strength 5/5 to all lower extremity muscle groups bilaterally. Limited joint ROM to the 1st MPJ right foot. Pes planus deformity noted bilateral LE. Patient ambulates independent of any assistive aids.  Radiographs: None  Assessment/Plan: 1. Pain due to onychomycosis of toenails of both feet   2. Diabetes mellitus without complication (HCC)   -Patient was evaluated and treated. All patient's and/or POA's questions/concerns answered on today's visit. -No new findings. No new  orders. -Continue foot and shoe inspections daily. Monitor blood glucose per PCP/Endocrinologist's recommendations. -Patient to continue soft, supportive shoe gear daily. -Mycotic toenails 1-5 bilaterally were debrided in length and girth with sterile nail nippers and dremel without incident. -Patient/POA to call should there be question/concern in the interim.   Return in about 3 months (around 07/23/2023).  Freddie Breech, DPM

## 2023-08-26 ENCOUNTER — Encounter: Payer: Self-pay | Admitting: Podiatry

## 2023-08-26 ENCOUNTER — Ambulatory Visit (INDEPENDENT_AMBULATORY_CARE_PROVIDER_SITE_OTHER): Payer: Medicare Other | Admitting: Podiatry

## 2023-08-26 VITALS — Ht 68.0 in | Wt 248.0 lb

## 2023-08-26 DIAGNOSIS — B351 Tinea unguium: Secondary | ICD-10-CM | POA: Diagnosis not present

## 2023-08-26 DIAGNOSIS — M79675 Pain in left toe(s): Secondary | ICD-10-CM

## 2023-08-26 DIAGNOSIS — E119 Type 2 diabetes mellitus without complications: Secondary | ICD-10-CM | POA: Diagnosis not present

## 2023-08-26 DIAGNOSIS — M79674 Pain in right toe(s): Secondary | ICD-10-CM

## 2023-08-30 NOTE — Progress Notes (Signed)
  Subjective:  Patient ID: Kirk Oneal, male    DOB: 1951/01/18,  MRN: [id number]  72 y.o. male presents preventative diabetic foot care and painful thick toenails that are difficult to trim. Pain interferes with ambulation. Aggravating factors include wearing enclosed shoe gear. Pain is relieved with periodic professional debridement.  Chief Complaint  Patient presents with   Nail Problem    Pt is here for [facility], last A1C was 7, PCP is Dr Pecola Leisure and LOV was 6 months ago.   New problem(s): None   PCP is Leilani Able, MD.  No Known Allergies  Review of Systems: Negative except as noted in the HPI.   Objective:  Kirk Oneal is a pleasant 72 y.o. male obese in NAD.Marland Kitchen AAO x 3.  Vascular Examination: Vascular status intact b/l with palpable pedal pulses. CFT immediate b/l. Pedal hair present. No edema. No pain with calf compression b/l. Skin temperature gradient WNL b/l. No varicosities noted. No cyanosis or clubbing noted.  Neurological Examination: Sensation grossly intact b/l with 10 gram monofilament. Vibratory sensation intact b/l.  Dermatological Examination: Pedal skin with normal turgor, texture and tone b/l. No open wounds nor interdigital macerations noted. Toenails 1-5 b/l thick, discolored, elongated with subungual debris and pain on dorsal palpation. No hyperkeratotic lesions noted b/l.   Musculoskeletal Examination: Muscle strength 5/5 to b/l LE.  No pain, crepitus noted b/l. Pes planus b/l. Patient ambulates independently without assistive aids.   Radiographs: None  Last A1c:       No data to display         Assessment:   1. Pain due to onychomycosis of toenails of both feet   2. Diabetes mellitus without complication (HCC)    Plan:  -Consent given for treatment as described below: -Examined patient. -Continue foot and shoe inspections daily. Monitor blood glucose per PCP/Endocrinologist's recommendations. -Continue supportive shoe gear  daily. -Toenails 1-5 b/l were debrided in length and girth with sterile nail nippers and dremel without iatrogenic bleeding.  -Patient/POA to call should there be question/concern in the interim.  Return in about 3 months (around 11/24/2023).  Freddie Breech, DPM      [place] LOCATION: [address] [place] [address]                   Office [phone]   [facility] LOCATION: [address] [place], [place] [address] Office [phone]

## 2023-12-23 ENCOUNTER — Ambulatory Visit (INDEPENDENT_AMBULATORY_CARE_PROVIDER_SITE_OTHER): Payer: 59 | Admitting: Podiatry

## 2023-12-23 ENCOUNTER — Encounter: Payer: Self-pay | Admitting: Podiatry

## 2023-12-23 VITALS — Ht 68.0 in | Wt 248.0 lb

## 2023-12-23 DIAGNOSIS — B351 Tinea unguium: Secondary | ICD-10-CM | POA: Diagnosis not present

## 2023-12-23 DIAGNOSIS — E119 Type 2 diabetes mellitus without complications: Secondary | ICD-10-CM

## 2023-12-23 DIAGNOSIS — M2141 Flat foot [pes planus] (acquired), right foot: Secondary | ICD-10-CM

## 2023-12-23 DIAGNOSIS — M79675 Pain in left toe(s): Secondary | ICD-10-CM

## 2023-12-23 DIAGNOSIS — M2142 Flat foot [pes planus] (acquired), left foot: Secondary | ICD-10-CM

## 2023-12-23 DIAGNOSIS — M79674 Pain in right toe(s): Secondary | ICD-10-CM

## 2023-12-28 NOTE — Progress Notes (Signed)
 ANNUAL DIABETIC FOOT EXAM  Subjective: Kirk Oneal presents today for annual diabetic foot exam.  Chief Complaint  Patient presents with   Kirk Oneal    He is here for diabetic nail trim, Last A1C was 6.8, Pcp is Dr Pecola Leisure and seen 2 time a year.    Patient confirms h/o diabetes.  Patient denies any h/o foot wounds.  Kirk Able, MD is patient's PCP.  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   Vitamin D deficiency 01/08/2018   Hyperlipidemia LDL goal <70 07/19/2017   Obstructive sleep apnea 07/19/2017   Obesity 07/19/2017   Diabetes mellitus without complication (HCC) 11/01/2016   Atherosclerosis 04/17/2014   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     atorvastatin (LIPITOR) 40 MG tablet TAKE ONE-HALF TABLET BY MOUTH ONCE EVERY DAY FOR CHOLESTEROL     citalopram (CELEXA) 20 MG tablet Take 1 tablet (20 mg total) by mouth daily. 90 tablet 0   citalopram (CELEXA) 40 MG tablet TAKE ONE TABLET BY MOUTH ONCE EVERY DAY  FOR DEPRESSION     clopidogrel (PLAVIX) 75 MG tablet Take 1 tablet (75 mg total) by mouth daily. 90 tablet 0   clotrimazole (LOTRIMIN) 1 % cream Apply 1 application topically 2 (two) times daily. To affected skin - continue for one week after resolution of rash 45 g 0   Cyanocobalamin (VITAMIN B-12 PO) Take 1 tablet by mouth daily.     glimepiride (AMARYL) 4 MG tablet Take 1 tablet (4 mg total) by mouth daily. with food 90 tablet 0   ketoconazole (NIZORAL) 2 % shampoo SHAMPOO SUFFICIENT AMOUNT TOPICALLY IN THE MORNING FROM SCALP TO LEGS FOR 7 DAYS, RINSE AFTER 5 MINUTES     lisinopril (ZESTRIL) 5 MG tablet TAKE ONE-HALF TABLET BY MOUTH EVERY DAY FOR BLOOD PRESSURE     meclizine (ANTIVERT) 25 MG tablet Take 1-2 tablets (25-50 mg total) by mouth 3 (three) times daily as needed for dizziness. 30 tablet 1   metFORMIN (GLUCOPHAGE) 1000 MG tablet Take 1 tablet (1,000 mg total) by mouth 2 (two) times daily with a meal. 180 tablet 0   pravastatin (PRAVACHOL) 40 MG tablet Take 1 tablet (40 mg total) by mouth daily. 90 tablet 0   sildenafil (VIAGRA) 100 MG tablet Take by mouth.     No current facility-administered medications on file prior to visit.  No Known Allergies Social History   Occupational History   Occupation: retired  Tobacco Use   Smoking status: Never   Smokeless tobacco: Never  Vaping Use   Vaping status: Never Used  Substance and Sexual Activity   Alcohol use: Yes    Alcohol/week: 0.0 standard drinks of alcohol    Comment: occasional   Drug use: No   Sexual activity: Yes   Family History  Problem Relation Age of Onset   Stomach cancer Mother    Rectal cancer Father    Colon polyps Sister    Colon polyps Brother    Colon cancer Neg Hx    Immunization History  Administered Date(s) Administered   Tdap 09/22/2012     Review of Systems: Negative except as noted in the HPI.   Objective: There were no vitals filed for this visit.  Kirk Oneal is a pleasant 73  y.o. male in NAD. AAO X 3.  Diabetic foot exam was performed with the following findings:   Vascular Examination: Capillary refill time immediate b/l. Vascular status intact b/l with palpable pedal pulses. Pedal hair present b/l. No pain with calf compression b/l. Skin temperature gradient WNL b/l. No cyanosis or clubbing b/l. No ischemia or gangrene noted b/l.   Neurological Examination: Sensation grossly intact b/l with 10 gram monofilament. Vibratory sensation intact b/l.   Dermatological Examination: Pedal skin with normal turgor, texture and tone b/l.  No open wounds. No interdigital macerations.   Toenails 1-5 b/l thick, discolored, elongated with subungual debris and pain on dorsal palpation.   No corns, calluses nor porokeratotic lesions noted.  Musculoskeletal Examination: Muscle strength 5/5 to all lower extremity muscle groups bilaterally. Pes planus deformity noted bilateral LE. Patient ambulates independent of any assistive aids.  Radiographs: None     Lab Results  Component Value Date   HGBA1C 7.2 (H) 06/15/2020   No results found. ADA Risk Categorization: Low Risk :  Patient has all of the following: Intact protective sensation No prior foot ulcer  No severe deformity Pedal pulses present  Assessment: 1. Pain due to onychomycosis of toenails of both feet   2. Pes planus of both feet   3. Diabetes mellitus without complication (HCC)   4. Encounter for diabetic foot exam (HCC)     Plan: Diabetic foot examination performed today.  All patient's and/or POA's questions/concerns addressed on today's visit. Mycotic toenails 1-5 debrided in length and girth without incident. Continue daily foot inspections and monitor blood glucose per PCP/Endocrinologist's recommendations. Continue soft, supportive shoe gear daily. Report any pedal injuries to medical professional. Call office if there are any questions/concerns. -Patient/POA to call should there be question/concern  in the interim. Return in about 3 months (around 03/23/2024).  Freddie Breech, DPM      [place] LOCATION: [address] [place] [address]                   Office [phone]   [facility] LOCATION: [address] [place], [place] [address] Office [phone]

## 2024-04-06 ENCOUNTER — Ambulatory Visit (INDEPENDENT_AMBULATORY_CARE_PROVIDER_SITE_OTHER): Admitting: Podiatry

## 2024-04-06 DIAGNOSIS — Z91199 Patient's noncompliance with other medical treatment and regimen due to unspecified reason: Secondary | ICD-10-CM

## 2024-04-10 NOTE — Progress Notes (Signed)
 1. No-show for appointment

## 2024-04-26 ENCOUNTER — Ambulatory Visit (INDEPENDENT_AMBULATORY_CARE_PROVIDER_SITE_OTHER): Admitting: Podiatry

## 2024-04-26 DIAGNOSIS — Z91199 Patient's noncompliance with other medical treatment and regimen due to unspecified reason: Secondary | ICD-10-CM

## 2024-04-27 ENCOUNTER — Ambulatory Visit (INDEPENDENT_AMBULATORY_CARE_PROVIDER_SITE_OTHER): Admitting: Podiatry

## 2024-04-27 ENCOUNTER — Encounter: Payer: Self-pay | Admitting: Podiatry

## 2024-04-27 DIAGNOSIS — E119 Type 2 diabetes mellitus without complications: Secondary | ICD-10-CM

## 2024-04-27 DIAGNOSIS — B351 Tinea unguium: Secondary | ICD-10-CM | POA: Diagnosis not present

## 2024-04-27 DIAGNOSIS — M79674 Pain in right toe(s): Secondary | ICD-10-CM | POA: Diagnosis not present

## 2024-04-27 DIAGNOSIS — M79675 Pain in left toe(s): Secondary | ICD-10-CM

## 2024-04-27 NOTE — Progress Notes (Signed)
  Subjective:  Patient ID: Kirk Oneal, male    DOB: [birth year],  MRN: [id number]  Kirk Oneal presents to clinic today for preventative diabetic foot care for painful elongated mycotic toenails 1-5 bilaterally which are tender when wearing enclosed shoe gear. Pain is relieved with periodic professional debridement. Patient has h/o flat feet and states he has had this for the past 10 years. He gets diabetic shoes from the [employer].  Chief Complaint  Patient presents with   Diabetes    DFC NIDDM A1C 7.0 Toenail trim. LOV with PCP 2/ 2025. Patient has concerns with pes planus bilateral, Onychomycosis and bilateral bunions. 3 pain and numbness.   New problem(s): None.   PCP is Ilah Crigler, MD.  No Known Allergies  Review of Systems: Negative except as noted in the HPI.  Objective: No changes noted in today's physical examination. There were no vitals filed for this visit. Kirk Oneal is a pleasant 73 y.o. male in NAD. AAO x 3.  Vascular Examination: Capillary refill time immediate b/l. Palpable pedal pulses. Pedal hair present b/l. Pedal edema absent. No pain with calf compression b/l. Skin temperature gradient WNL b/l. No cyanosis or clubbing b/l. No ischemia or gangrene noted b/l.   Neurological Examination: Sensation grossly intact b/l with 10 gram monofilament. Vibratory sensation intact b/l.   Dermatological Examination: Pedal skin with normal turgor, texture and tone b/l.  No open wounds. No interdigital macerations.   Toenails 1-5 b/l thick, discolored, elongated with subungual debris and pain on dorsal palpation.   No hyperkeratotic nor porokeratotic lesions.  Musculoskeletal Examination: Muscle strength 5/5 to all lower extremity muscle groups bilaterally. HAV with bunion deformity noted b/l LE. Pes planus deformity noted bilateral LE.SABRA No pain, crepitus or joint limitation noted with ROM b/l LE.  Patient ambulates independently without assistive  aids.  Radiographs: None  Assessment/Plan: 1. Pain due to onychomycosis of toenails of both feet   2. Diabetes mellitus without complication [facility])     Patient was evaluated and treated. All patient's and/or POA's questions/concerns addressed on today's visit. Toenails 1-5 debrided in length and girth without incident. Continue foot and shoe inspections daily. Monitor blood glucose per PCP/Endocrinologist's recommendations. Continue soft, supportive shoe gear to accommodate bunion deformity and pes planus daily. Report any pedal injuries to medical professional. Call office if there are any questions/concerns. Return in about 3 months (around 07/28/2024).  Kirk Oneal, DPM      [place] LOCATION: [address] [place] [address]                   Office [phone]   [facility] LOCATION: [address] [place], [place] [address] Office [phone]

## 2024-04-27 NOTE — Progress Notes (Signed)
 1. Failure to attend appointment without reason given    Appt canceled by clinic. Power outage in office.

## 2024-08-09 ENCOUNTER — Ambulatory Visit (INDEPENDENT_AMBULATORY_CARE_PROVIDER_SITE_OTHER): Admitting: Podiatry

## 2024-08-09 ENCOUNTER — Encounter: Payer: Self-pay | Admitting: Podiatry

## 2024-08-09 DIAGNOSIS — M79675 Pain in left toe(s): Secondary | ICD-10-CM

## 2024-08-09 DIAGNOSIS — B351 Tinea unguium: Secondary | ICD-10-CM | POA: Diagnosis not present

## 2024-08-09 DIAGNOSIS — E119 Type 2 diabetes mellitus without complications: Secondary | ICD-10-CM

## 2024-08-09 DIAGNOSIS — M79674 Pain in right toe(s): Secondary | ICD-10-CM | POA: Diagnosis not present

## 2024-08-10 ENCOUNTER — Ambulatory Visit: Admitting: Podiatry

## 2024-08-15 NOTE — Progress Notes (Signed)
  Subjective:  Patient ID: Kirk Oneal, male    DOB: 1951/07/17,  MRN: [id number]  Kirk Oneal presents to clinic today for preventative diabetic foot care for painful mycotic toenails x 10 which interfere with daily activities. Pain is relieved with periodic professional debridement.  Chief Complaint  Patient presents with   Diabetes    A1c 7.2 Dr. Ilah is his PCP. He is unsure of his last visit   New problem(s): None.   PCP is Kirk Crigler, MD.  No Known Allergies  Review of Systems: Negative except as noted in the HPI.  Objective: No changes noted in today's physical examination. There were no vitals filed for this visit. Kirk Oneal is a pleasant 73 y.o. male in NAD. AAO x 3.  Vascular Examination: Capillary refill time immediate b/l. Palpable pedal pulses. Pedal hair present b/l. Pedal edema absent. No pain with calf compression b/l. Skin temperature gradient WNL b/l. No cyanosis or clubbing b/l. No ischemia or gangrene noted b/l.   Neurological Examination: Sensation grossly intact b/l with 10 gram monofilament. Vibratory sensation intact b/l.   Dermatological Examination: Pedal skin with normal turgor, texture and tone b/l.  No open wounds. No interdigital macerations.   Toenails 1-5 b/l thick, discolored, elongated with subungual debris and pain on dorsal palpation.   No hyperkeratotic nor porokeratotic lesions.  Musculoskeletal Examination: Muscle strength 5/5 to all lower extremity muscle groups bilaterally. HAV with bunion deformity noted b/l LE. Pes planus deformity noted bilateral LE.SABRA No pain, crepitus or joint limitation noted with ROM b/l LE.  Patient ambulates independently without assistive aids.  Radiographs: None  Assessment/Plan: 1. Pain due to onychomycosis of toenails of both feet   2. Diabetes mellitus without complication [facility])   Consent given for treatment. Patient examined. All patient's and/or POA's questions/concerns addressed on  today's visit. Mycotic toenails 1-5 b/l debrided in length and girth without incident. Continue foot and shoe inspections daily. Monitor blood glucose per PCP/Endocrinologist's recommendations.Continue soft, supportive shoe gear daily. Report any pedal injuries to medical professional. Call office if there are any quesitons/concerns. -Patient/POA to call should there be question/concern in the interim.   Return in about 3 months (around 11/09/2024).  Kirk Oneal, DPM      [place] LOCATION: [address] [place] [address]                   Office [phone]   [facility] LOCATION: [address] [place], [place] [address] Office [phone]

## 2024-10-03 ENCOUNTER — Other Ambulatory Visit: Payer: Self-pay

## 2024-10-03 ENCOUNTER — Encounter (HOSPITAL_COMMUNITY): Payer: Self-pay

## 2024-10-03 ENCOUNTER — Emergency Department (HOSPITAL_COMMUNITY)
Admission: EM | Admit: 2024-10-03 | Discharge: 2024-10-04 | Disposition: A | Discharge status: Home / Self Care | Attending: Emergency Medicine | Admitting: Emergency Medicine

## 2024-10-03 ENCOUNTER — Emergency Department (HOSPITAL_COMMUNITY)

## 2024-10-03 DIAGNOSIS — R42 Dizziness and giddiness: Secondary | ICD-10-CM | POA: Diagnosis present

## 2024-10-03 DIAGNOSIS — I6503 Occlusion and stenosis of bilateral vertebral arteries: Secondary | ICD-10-CM | POA: Insufficient documentation

## 2024-10-03 LAB — COMPREHENSIVE METABOLIC PANEL WITH GFR
ALT: 27 U/L (ref 0–44)
AST: 43 U/L — ABNORMAL HIGH (ref 15–41)
Albumin: 4.3 g/dL (ref 3.5–5.0)
Alkaline Phosphatase: 60 U/L (ref 38–126)
Anion gap: 12 (ref 5–15)
BUN: 9 mg/dL (ref 8–23)
CO2: 23 mmol/L (ref 22–32)
Calcium: 9.4 mg/dL (ref 8.9–10.3)
Chloride: 102 mmol/L (ref 98–111)
Creatinine, Ser: 0.96 mg/dL (ref 0.61–1.24)
GFR, Estimated: >60 mL/min
Glucose, Bld: 164 mg/dL — ABNORMAL HIGH (ref 70–99)
Potassium: 4.7 mmol/L (ref 3.5–5.1)
Sodium: 136 mmol/L (ref 135–145)
Total Bilirubin: 0.4 mg/dL (ref 0.0–1.2)
Total Protein: 7.2 g/dL (ref 6.5–8.1)

## 2024-10-03 LAB — CBC WITH DIFFERENTIAL/PLATELET
Abs Immature Granulocytes: 0.04 K/uL (ref 0.00–0.07)
Basophils Absolute: 0.1 K/uL (ref 0.0–0.1)
Basophils Relative: 1 %
Eosinophils Absolute: 0.1 K/uL (ref 0.0–0.5)
Eosinophils Relative: 1 %
HCT: 39.9 % (ref 39.0–52.0)
Hemoglobin: 14 g/dL (ref 13.0–17.0)
Immature Granulocytes: 1 %
Lymphocytes Relative: 25 %
Lymphs Abs: 2 K/uL (ref 0.7–4.0)
MCH: 33.9 pg (ref 26.0–34.0)
MCHC: 35.1 g/dL (ref 30.0–36.0)
MCV: 96.6 fL (ref 80.0–100.0)
Monocytes Absolute: 0.5 K/uL (ref 0.1–1.0)
Monocytes Relative: 6 %
Neutro Abs: 5.3 K/uL (ref 1.7–7.7)
Neutrophils Relative %: 66 %
Platelets: 317 K/uL (ref 150–400)
RBC: 4.13 MIL/uL — ABNORMAL LOW (ref 4.22–5.81)
RDW: 13.3 % (ref 11.5–15.5)
Smear Review: NORMAL
WBC: 8 K/uL (ref 4.0–10.5)
nRBC: 0 % (ref 0.0–0.2)

## 2024-10-03 LAB — I-STAT CHEM 8, ED
BUN: 10 mg/dL (ref 8–23)
Calcium, Ion: 1.12 mmol/L — ABNORMAL LOW (ref 1.15–1.40)
Chloride: 104 mmol/L (ref 98–111)
Creatinine, Ser: 0.9 mg/dL (ref 0.61–1.24)
Glucose, Bld: 162 mg/dL — ABNORMAL HIGH (ref 70–99)
HCT: 41 % (ref 39.0–52.0)
Hemoglobin: 13.9 g/dL (ref 13.0–17.0)
Potassium: 5.4 mmol/L — ABNORMAL HIGH (ref 3.5–5.1)
Sodium: 136 mmol/L (ref 135–145)
TCO2: 24 mmol/L (ref 22–32)

## 2024-10-03 LAB — TROPONIN T, HIGH SENSITIVITY
Troponin T High Sensitivity: <15 ng/L (ref 0–19)
Troponin T High Sensitivity: <15 ng/L (ref 0–19)

## 2024-10-03 MED ORDER — MECLIZINE HCL 25 MG PO TABS
25.0000 mg | ORAL_TABLET | Freq: Once | ORAL | Status: AC
  Administered 2024-10-03: 25 mg via ORAL
  Filled 2024-10-03: qty 1

## 2024-10-03 MED ORDER — IOHEXOL 350 MG/ML SOLN
75.0000 mL | Freq: Once | INTRAVENOUS | Status: AC | PRN
  Administered 2024-10-03: 75 mL via INTRAVENOUS

## 2024-10-03 NOTE — ED Triage Notes (Signed)
 Pt states he woke up dizzy on Sunday morning (LSN some time Sat night).  Went to see his MD at the [facility] today and they sent him here.  Hx of vertigo many years ago.  No one-sided deficits.

## 2024-10-03 NOTE — ED Notes (Signed)
 Called PT three times for vitals check, no response.[email or web address]

## 2024-10-03 NOTE — ED Provider Triage Note (Signed)
 Emergency Medicine Provider Triage Evaluation Note  Kirk Oneal , a 74 y.o. male  was evaluated in triage.  Pt complains of dizziness with position change and head movements since Sunday morning. Reports that he is asymptomatic when still.  Denies any trouble talking.  Feels he is little wobbly with walking but does improve.  Denies any new numbness or tingling.  Denies any unilateral weakness.  Denies any headache or any vision changes.  Denies any chest pain or shortness of breath.  No nausea or vomiting.  Was seen at the [facility] and sent over for further evaluation.  There is a history of vertigo however reports it was too long ago and is unsure if this is similar to his previous.  Review of Systems  Positive:  Negative:   Physical Exam  BP (!) 149/104 (BP Location: Right Arm)   Pulse 97   Temp 97.8 F (36.6 C) (Oral)   Resp 19   SpO2 100%  Gen:   Awake, no distress   Resp:  Normal effort  MSK:   Moves extremities without difficulty  Other:  Strength intact in upper and lower extremities.  Station intact throughout per patient.  Cranial nerves grossly intact.  Medical Decision Making  Medically screening exam initiated at 5:23 PM.  Appropriate orders placed.  Kirk Oneal was informed that the remainder of the evaluation will be completed by another provider, this initial triage assessment does not replace that evaluation, and the importance of remaining in the ED until their evaluation is complete.  CT angio and meclizine  ordered.  Patient out of window for any code stroke.  Van negative.   Kirk Ernst, PA-C 10/03/24 1727

## 2024-10-04 ENCOUNTER — Emergency Department (HOSPITAL_COMMUNITY)

## 2024-10-04 DIAGNOSIS — R42 Dizziness and giddiness: Secondary | ICD-10-CM

## 2024-10-04 NOTE — ED Provider Notes (Signed)
 " [place] [facility] DEPARTMENT AT [facility] Provider Note   CSN: [id number] Arrival date & time: 10/03/24  1625     Patient presents with: Dizziness   Kirk Oneal is a 74 y.o. male.   HPI Patient with a history of prior vertebral artery dissection presents with dizziness.  Onset 3 days ago, symptoms worse with positioning, no focal weakness in extremities.  Patient is on clopidogrel .  Patient initially alone, subsequently joined by male companion.     Prior to Admission medications  Medication Sig Start Date End Date Taking? Authorizing Provider  ammonium lactate (LAC-HYDRIN) 12 % lotion APPLY SMALL AMOUNT TOPICALLY TWO TIMES A DAY FOR DRY FEET 01/01/22   [provider]  ammonium lactate (LAC-HYDRIN) 12 % lotion APPLY SMALL AMOUNT TOPICALLY TWO TIMES A DAY FOR DRY FEET 04/18/22   [provider]  atorvastatin (LIPITOR) 40 MG tablet Take by mouth. 01/02/22   [provider]  atorvastatin (LIPITOR) 40 MG tablet TAKE ONE-HALF TABLET BY MOUTH ONCE EVERY DAY FOR CHOLESTEROL 04/18/22   [provider]  citalopram  (CELEXA ) 20 MG tablet Take 1 tablet (20 mg total) by mouth daily. 05/16/20   Kip Ade, NP  citalopram  (CELEXA ) 40 MG tablet TAKE ONE TABLET BY MOUTH ONCE EVERY DAY FOR DEPRESSION 03/24/22   [provider]  clopidogrel  (PLAVIX ) 75 MG tablet Take 1 tablet (75 mg total) by mouth daily. 10/18/21   Graham, Laura E, PA-C  clotrimazole  (LOTRIMIN ) 1 % cream Apply 1 application topically 2 (two) times daily. To affected skin - continue for one week after resolution of rash 08/14/18   Marsa Edelman, DO  Cyanocobalamin  (VITAMIN B-12 PO) Take 1 tablet by mouth daily.    [provider]  glimepiride  (AMARYL ) 4 MG tablet Take 1 tablet (4 mg total) by mouth daily. with food 10/18/21   Graham, Laura E, PA-C  ketoconazole  (NIZORAL ) 2 % shampoo SHAMPOO SUFFICIENT AMOUNT TOPICALLY IN THE MORNING FROM SCALP TO LEGS FOR 7  DAYS, RINSE AFTER 5 MINUTES 01/28/21   [provider]  lisinopril (ZESTRIL) 5 MG tablet TAKE ONE-HALF TABLET BY MOUTH EVERY DAY FOR BLOOD PRESSURE 01/01/22   [provider]  meclizine  (ANTIVERT ) 25 MG tablet Take 1-2 tablets (25-50 mg total) by mouth 3 (three) times daily as needed for dizziness. 08/14/18   Alexander, Natalie, DO  metFORMIN  (GLUCOPHAGE ) 1000 MG tablet Take 1 tablet (1,000 mg total) by mouth 2 (two) times daily with a meal. 08/14/18   Marsa Edelman, DO  pravastatin  (PRAVACHOL ) 40 MG tablet Take 1 tablet (40 mg total) by mouth daily. 08/14/18   Alexander, Natalie, DO  sildenafil (VIAGRA) 100 MG tablet Take by mouth. 11/18/21   [provider]    Allergies: Patient has no known allergies.    Review of Systems  Updated Vital Signs BP (!) 128/55   Pulse 78   Temp 98.7 F (37.1 C)   Resp 18   Ht 1.727 m (5' 8)   Wt 107.5 kg   SpO2 100%   BMI 36.04 kg/m   Physical Exam Vitals and nursing note reviewed.  Constitutional:      General: He is not in acute distress.    Appearance: He is well-developed.  HENT:     Head: Normocephalic and atraumatic.  Eyes:     Conjunctiva/sclera: Conjunctivae normal.  Cardiovascular:     Rate and Rhythm: Normal rate and regular rhythm.  Pulmonary:     Effort: Pulmonary effort is normal.  No respiratory distress.     Breath sounds: No stridor.  Abdominal:     General: There is no distension.  Skin:    General: Skin is warm and dry.  Neurological:     Mental Status: He is alert and oriented to person, place, and time.     Cranial Nerves: No cranial nerve deficit, dysarthria or facial asymmetry.     Motor: No weakness, tremor, abnormal muscle tone or pronator drift.     (all labs ordered are listed, but only abnormal results are displayed) Labs Reviewed  CBC WITH DIFFERENTIAL/PLATELET - Abnormal; Notable for the following components:      Result Value   RBC 4.13 (*)    All other components within  normal limits  COMPREHENSIVE METABOLIC PANEL WITH GFR - Abnormal; Notable for the following components:   Glucose, Bld 164 (*)    AST 43 (*)    All other components within normal limits  I-STAT CHEM 8, ED - Abnormal; Notable for the following components:   Potassium 5.4 (*)    Glucose, Bld 162 (*)    Calcium, Ion 1.12 (*)    All other components within normal limits  TROPONIN T, HIGH SENSITIVITY  TROPONIN T, HIGH SENSITIVITY    EKG: EKG Interpretation Date/Time:  Monday October 03 2024 17:28:04 EST Ventricular Rate:  87 PR Interval:  136 QRS Duration:  86 QT Interval:  344 QTC Calculation: 413 R Axis:   -19  Text Interpretation: Normal sinus rhythm Nonspecific T wave abnormality Abnormal ECG When compared with ECG of 16-Sep-2015 22:33, PREVIOUS ECG IS PRESENT Confirmed by Darra Chew [phone]) on 10/03/2024 5:43:53 PM  Radiology: MR BRAIN WO CONTRAST Result Date: 10/04/2024 CLINICAL DATA:  Stroke suspected EXAM: MRI HEAD WITHOUT CONTRAST TECHNIQUE: Multiplanar, multiecho pulse sequences of the brain and surrounding structures were obtained without intravenous contrast. COMPARISON:  April 30, 2013 FINDINGS: MRI brain: The brain volume is normal. There are a few small foci of T2 hyperintensity in the cerebral white matter. These do not have restricted diffusion. There is no acute or chronic infarct. The ventricles are normal. No mass lesion. There are normal flow signals in the carotid arteries and basilar artery. No significant bone marrow signal abnormality. No significant abnormality in the paranasal sinuses or soft tissues. IMPRESSION: No acute infarct or other significant abnormality Electronically Signed   By: Nancyann Burns M.D.   On: 10/04/2024 10:01   CT ANGIO HEAD NECK W WO CM Result Date: 10/03/2024 CLINICAL DATA:  Initial evaluation for acute vertigo. EXAM: CT ANGIOGRAPHY HEAD AND NECK WITH AND WITHOUT CONTRAST TECHNIQUE: Multidetector CT imaging of the head and neck was  performed using the standard protocol during bolus administration of intravenous contrast. Multiplanar CT image reconstructions and MIPs were obtained to evaluate the vascular anatomy. Carotid stenosis measurements (when applicable) are obtained utilizing NASCET criteria, using the distal internal carotid diameter as the denominator. RADIATION DOSE REDUCTION: This exam was performed according to the departmental dose-optimization program which includes automated exposure control, adjustment of the mA and/or kV according to patient size and/or use of iterative reconstruction technique. CONTRAST:  75mL OMNIPAQUE  IOHEXOL  350 MG/ML SOLN COMPARISON:  Prior exam from 09/17/2015. FINDINGS: CT HEAD FINDINGS Brain: Cerebral volume within normal limits for patient age. No acute intracranial hemorrhage. No acute large vessel territory infarct. No mass lesion, midline shift, or mass effect. Ventricles are normal in size without hydrocephalus. No extra-axial fluid collection. Vascular: No abnormal hyperdense vessel. Skull: Scalp soft tissues demonstrate no  acute abnormality. Calvarium intact. Sinuses/Orbits: Globes and orbital soft tissues within normal limits. Visualized paranasal sinuses are largely clear. No significant mastoid effusion. CTA NECK FINDINGS Aortic arch: Visualized aortic arch within normal limits for caliber standard branch pattern. No stenosis or other abnormality about the origin the great vessels. Right carotid system: Right common and internal carotid arteries are patent without dissection or stenosis. Left carotid system: Left common and internal carotid arteries are patent without dissection. Mild atheromatous change about the left carotid bulb without stenosis. Vertebral arteries: Left vertebral artery widely patent without stenosis or dissection. The right vertebral artery is again seen to be diminutive and irregular, similar to prior. Few superimposed severe stenoses noted about the proximal right V1  and V2 segments. Right vertebral artery remains patent within the neck. No visible acute dissection. Skeleton: No worrisome osseous lesions. Other neck: No other acute finding. Upper chest: No other acute finding. Review of the MIP images confirms the above findings CTA HEAD FINDINGS Anterior circulation: Mild atheromatous change about the carotid siphons without significant stenosis. A1 segments patent bilaterally. Left A1 dominant. Normal anterior communicating artery complex. Anterior cerebral arteries patent without stenosis. No M1 stenosis or occlusion. No proximal MCA branch occlusion or high-grade stenosis. Distal MCA branches perfused and symmetric. Posterior circulation: Dominant left V4 segment patent without stenosis. Left PICA patent. Diminutive irregular right vertebral artery occludes as it crosses into the cranial vault, new as compared to prior CTA from 2016. Slight retrograde filling of the distal right V4 segment across the vertebrobasilar junction. Right PICA not well seen. Basilar patent without stenosis. Superior cerebellar and posterior cerebral arteries patent bilaterally. Venous sinuses: Patent allowing for timing the contrast bolus. Anatomic variants: As above.  No aneurysm. Review of the MIP images confirms the above findings IMPRESSION: CT HEAD: No acute intracranial abnormality. CTA HEAD AND NECK: 1. Diminutive and irregular right vertebral artery with a few superimposed severe stenoses about the proximal right V1 and V2 segments, similar to prior. Again, this could be related to a prior dissection. Right vertebral artery now occludes as it crosses into the cranial vault, age indeterminate, and new as compared to prior CTA from 2016. 2. Mild atheromatous change about the left carotid bulb and carotid siphons without hemodynamically significant or correctable stenosis. No other acute vascular abnormality. Electronically Signed   By: Morene Hoard M.D.   On: 10/03/2024 21:08      Procedures   Medications Ordered in the ED  meclizine  (ANTIVERT ) tablet 25 mg (25 mg Oral Given 10/03/24 1746)  iohexol  (OMNIPAQUE ) 350 MG/ML injection 75 mL (75 mLs Intravenous Contrast Given 10/03/24 1923)                                    Medical Decision Making Patient with unremarkable neurologic exam presents with dizziness in the context of known vertebral artery dissection.  Patient also has history of vertigo, complicating evaluation. Given his risk profile, stroke consideration.  CT angiography ordered from triage reviewed, no evidence for progression of dissection, though there is substantial narrowing of the vasculature. Initial vitals reassuring 80 sinus normal pulse ox 100% room air normal MRI obtained, reviewed, no stroke.  He and his companion and I discussed importance of continuing clopidogrel , he will follow-up with neurology, or primary care.  Amount and/or Complexity of Data Reviewed Independent Historian: friend External Data Reviewed: notes. Labs:  Decision-making details documented in ED Course. Radiology:  ordered and independent interpretation performed. Decision-making details documented in ED Course. ECG/medicine tests:  Decision-making details documented in ED Course.  Risk Prescription drug management. Decision regarding hospitalization. Diagnosis or treatment significantly limited by social determinants of health.   11:03 AM Patient in no distress, awake and alert on repeat exam.     Final diagnoses:  Dizziness  Stenosis of both vertebral arteries    ED Discharge Orders          Ordered    Ambulatory referral to Neurology       Comments: An appointment is requested in approximately: 1 week   10/04/24 1103               Garrick Charleston, MD 10/04/24 1103  "

## 2024-10-04 NOTE — Discharge Instructions (Addendum)
 Please continue taking all medication as prescribed and follow-up with our neurology colleagues.  Return here for concerning changes in your condition.

## 2024-10-04 NOTE — ED Notes (Signed)
 Pt in MRI.

## 2024-10-31 ENCOUNTER — Institutional Professional Consult (permissible substitution): Admitting: Diagnostic Neuroimaging

## 2024-11-23 ENCOUNTER — Ambulatory Visit: Admitting: Podiatry
